# Patient Record
Sex: Male | Born: 1950
Health system: Southern US, Community
[De-identification: ages and names within clinical notes are randomized; demographics above are authoritative.]

## PROBLEM LIST (undated history)

## (undated) ENCOUNTER — Encounter: Attending: Internal Medicine | Primary: Internal Medicine

## (undated) ENCOUNTER — Encounter

## (undated) ENCOUNTER — Encounter: Payer: MEDICARE | Attending: Internal Medicine | Primary: Internal Medicine

## (undated) ENCOUNTER — Ambulatory Visit: Payer: MEDICARE

## (undated) ENCOUNTER — Encounter: Attending: Adult Health | Primary: Adult Health

## (undated) ENCOUNTER — Ambulatory Visit

## (undated) ENCOUNTER — Encounter: Attending: Family Medicine | Primary: Family Medicine

## (undated) ENCOUNTER — Ambulatory Visit: Payer: MEDICARE | Attending: Internal Medicine | Primary: Internal Medicine

## (undated) ENCOUNTER — Telehealth: Attending: Internal Medicine | Primary: Internal Medicine

## (undated) ENCOUNTER — Telehealth

## (undated) ENCOUNTER — Ambulatory Visit: Payer: MEDICARE | Attending: Orthopaedic Surgery | Primary: Orthopaedic Surgery

## (undated) ENCOUNTER — Ambulatory Visit: Attending: Family Medicine | Primary: Family Medicine

## (undated) ENCOUNTER — Ambulatory Visit: Payer: MEDICARE | Attending: Adult Health | Primary: Adult Health

## (undated) DIAGNOSIS — G4730 Sleep apnea, unspecified: Secondary | ICD-10-CM

## (undated) DIAGNOSIS — K219 Gastro-esophageal reflux disease without esophagitis: Secondary | ICD-10-CM

## (undated) DIAGNOSIS — I4891 Unspecified atrial fibrillation: Secondary | ICD-10-CM

## (undated) DIAGNOSIS — S62521P Displaced fracture of distal phalanx of right thumb, subsequent encounter for fracture with malunion: Secondary | ICD-10-CM

## (undated) DIAGNOSIS — M51369 Other intervertebral disc degeneration, lumbar region without mention of lumbar back pain or lower extremity pain: Secondary | ICD-10-CM

## (undated) DIAGNOSIS — M1990 Unspecified osteoarthritis, unspecified site: Secondary | ICD-10-CM

## (undated) DIAGNOSIS — S62639A Displaced fracture of distal phalanx of unspecified finger, initial encounter for closed fracture: Secondary | ICD-10-CM

## (undated) DIAGNOSIS — Z22322 Carrier or suspected carrier of Methicillin resistant Staphylococcus aureus: Secondary | ICD-10-CM

## (undated) DIAGNOSIS — M5136 Other intervertebral disc degeneration, lumbar region: Secondary | ICD-10-CM

## (undated) DIAGNOSIS — N529 Male erectile dysfunction, unspecified: Secondary | ICD-10-CM

## (undated) HISTORY — PX: LACERATION REPAIR: SHX5168

## (undated) HISTORY — DX: Displaced fracture of distal phalanx of right thumb, subsequent encounter for fracture with malunion: S62.521P

## (undated) HISTORY — PX: ABLATION: SHX5711

## (undated) HISTORY — DX: Carrier or suspected carrier of methicillin resistant Staphylococcus aureus: Z22.322

## (undated) HISTORY — DX: Gastro-esophageal reflux disease without esophagitis: K21.9

## (undated) HISTORY — PX: TONSILLECTOMY: SUR1361

## (undated) HISTORY — DX: Male erectile dysfunction, unspecified: N52.9

## (undated) HISTORY — DX: Other intervertebral disc degeneration, lumbar region without mention of lumbar back pain or lower extremity pain: M51.369

## (undated) HISTORY — DX: Displaced fracture of distal phalanx of unspecified finger, initial encounter for closed fracture: S62.639A

## (undated) HISTORY — DX: Unspecified osteoarthritis, unspecified site: M19.90

## (undated) HISTORY — DX: Other intervertebral disc degeneration, lumbar region: M51.36

---
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
## 1998-05-26 ENCOUNTER — Emergency Department (HOSPITAL_COMMUNITY): Admission: EM | Admit: 1998-05-26 | Discharge: 1998-05-26 | Payer: Self-pay

## 2004-08-28 ENCOUNTER — Ambulatory Visit: Payer: Self-pay | Admitting: Internal Medicine

## 2004-09-11 ENCOUNTER — Ambulatory Visit: Payer: Self-pay | Admitting: Internal Medicine

## 2004-10-16 ENCOUNTER — Ambulatory Visit: Payer: Self-pay | Admitting: Internal Medicine

## 2005-07-02 ENCOUNTER — Ambulatory Visit: Payer: Self-pay | Admitting: Internal Medicine

## 2011-08-14 ENCOUNTER — Ambulatory Visit (INDEPENDENT_AMBULATORY_CARE_PROVIDER_SITE_OTHER): Payer: PRIVATE HEALTH INSURANCE | Admitting: Internal Medicine

## 2011-08-14 VITALS — BP 102/63 | HR 51 | Temp 97.7°F | Resp 18 | Ht 70.0 in | Wt 175.0 lb

## 2011-08-14 DIAGNOSIS — Z23 Encounter for immunization: Secondary | ICD-10-CM

## 2011-08-14 MED ORDER — TETANUS-DIPHTH-ACELL PERTUSSIS 5-2.5-18.5 LF-MCG/0.5 IM SUSP
0.5000 mL | Freq: Once | INTRAMUSCULAR | Status: AC
  Administered 2011-08-14: 0.5 mL via INTRAMUSCULAR

## 2011-08-14 NOTE — Progress Notes (Signed)
  Subjective:    Patient ID: [name] Le, male    DOB: [birth year], 61 y.o.   MRN: [id number]  HPI Mr. Lizotte is here with his wife for Tdap, they are expecting their first grandchild next week.  He is in good health, works in [employer] is on no meds.  He has never had a reaction to immunizations before.    Review of Systems  All other systems reviewed and are negative.       Objective:   Physical Exam  Vitals reviewed. Constitutional: He appears well-developed and well-nourished.  HENT:  Head: Normocephalic.  Cardiovascular: Normal rate, regular rhythm and normal heart sounds.   Pulmonary/Chest: Effort normal and breath sounds normal.  Skin: Skin is warm and dry.  Psychiatric: He has a normal mood and affect. His behavior is normal.          Assessment & Plan:  Tdap updated today

## 2012-06-09 ENCOUNTER — Telehealth: Payer: Self-pay | Admitting: Internal Medicine

## 2012-06-09 ENCOUNTER — Ambulatory Visit (INDEPENDENT_AMBULATORY_CARE_PROVIDER_SITE_OTHER): Payer: PRIVATE HEALTH INSURANCE | Admitting: Internal Medicine

## 2012-06-09 ENCOUNTER — Other Ambulatory Visit: Payer: PRIVATE HEALTH INSURANCE

## 2012-06-09 ENCOUNTER — Encounter: Payer: Self-pay | Admitting: Internal Medicine

## 2012-06-09 VITALS — BP 120/80 | HR 76 | Temp 97.6°F | Resp 16 | Wt 176.0 lb

## 2012-06-09 DIAGNOSIS — IMO0002 Reserved for concepts with insufficient information to code with codable children: Secondary | ICD-10-CM

## 2012-06-09 DIAGNOSIS — L02419 Cutaneous abscess of limb, unspecified: Secondary | ICD-10-CM | POA: Insufficient documentation

## 2012-06-09 MED ORDER — SULFAMETHOXAZOLE-TRIMETHOPRIM 800-160 MG PO TABS: 1.0000 | ORAL_TABLET | Freq: Two times a day (BID) | ORAL | Status: DC

## 2012-06-09 NOTE — Assessment & Plan Note (Signed)
I and D done I await culture results Will empirically cover staph and MRSA with bactrim He RTC in 2-3 days for recheck and packing removal

## 2012-06-09 NOTE — Progress Notes (Signed)
  Subjective:    Patient ID: [name] Le, male    DOB: 10-Sep-1950, 62 y.o.   MRN: [id number]  HPI  New to me he complains of a 5 day history of worsening pain, redness, swelling in his left axilla.  Review of Systems  Constitutional: Negative for fever, chills, diaphoresis, activity change, appetite change, fatigue and unexpected weight change.  HENT: Negative.   Eyes: Negative.   Respiratory: Negative.   Cardiovascular: Negative.   Gastrointestinal: Negative.   Genitourinary: Negative.   Musculoskeletal: Negative.   Skin: Positive for wound. Negative for color change, pallor and rash.  Neurological: Negative.   Hematological: Negative for adenopathy. Does not bruise/bleed easily.  Psychiatric/Behavioral: Negative.        Objective:   Physical Exam  Vitals reviewed. Constitutional: He is oriented to person, place, and time. He appears well-developed and well-nourished.  Non-toxic appearance. He does not have a sickly appearance. He does not appear ill. No distress.  HENT:  Head: Normocephalic and atraumatic.  Mouth/Throat: Oropharynx is clear and moist. No oropharyngeal exudate.  Eyes: Conjunctivae normal are normal. Right eye exhibits no discharge. Left eye exhibits no discharge. No scleral icterus.  Neck: Normal range of motion. Neck supple. No JVD present. No tracheal deviation present. No thyromegaly present.  Cardiovascular: Normal rate, regular rhythm, normal heart sounds and intact distal pulses.  Exam reveals no gallop and no friction rub.   No murmur heard. Pulmonary/Chest: Effort normal and breath sounds normal. No stridor. No respiratory distress. He has no wheezes. He has no rales. He exhibits no tenderness.  Abdominal: Soft. Bowel sounds are normal. He exhibits no distension and no mass. There is no tenderness. There is no rebound and no guarding.  Musculoskeletal: Normal range of motion. He exhibits no edema and no tenderness.  Lymphadenopathy:    He has no  cervical adenopathy.  Neurological: He is oriented to person, place, and time.  Skin: Skin is warm and dry. No abrasion, no bruising, no burn, no laceration, no lesion, no petechiae, no purpura and no rash noted. Rash is not macular, not papular, not maculopapular, not nodular, not pustular, not vesicular and not urticarial. He is not diaphoretic. There is erythema. No pallor.       In the left axilla there is a 3 cm area of swelling, erythema, warmth with a central area that measures about 1 cm that shows a small pore with thick exudate surrounded by induration and fluctuance. There is no streaking.  The left axilla was cleaned with betadine. The abscess was anesthetized with 3 cc of 2% lido with epi that was infiltrated from the periphery at the 4 different sides.  Then a 4 mm punch incision was made in the central/fluctuant area and a small amount of thick purulent exudate was collected and sent to the lab. The cavity had a few loculations so it was irrigated with H2O2 on Q-tips. Then it was packed with iodoform. A dressing was applied. He tolerated the I and D well with no bleeding or complications.    Psychiatric: He has a normal mood and affect. His behavior is normal. Judgment and thought content normal.          Assessment & Plan:

## 2012-06-09 NOTE — Telephone Encounter (Signed)
Patient Information:  Caller Name: [place]  Phone: [phone]  Patient: [name] Le, [name] Le  Gender: Male  DOB: 12-29-1950  Age: 62 Years  PCP: Illene Regulus (Adults only)  Office Follow Up:  Does the office need to follow up with this patient?: No  Instructions For The Office: N/A   Symptoms  Reason For Call & Symptoms: Patient states he has "knots"  in Left axillary area.  He states there were x4.  He placed warm compresses on the area and all opened and drained except one of them.  Size-quarter. No head, +painful  Reviewed Health History In EMR: Yes  Reviewed Medications In EMR: Yes  Reviewed Allergies In EMR: Yes  Reviewed Surgeries / Procedures: No  Date of Onset of Symptoms: 05/28/2012  Treatments Tried: warm compresses 20 minutes, peroxide, soap and water, neosporin  Treatments Tried Worked: No  Guideline(s) Used:  Skin Lesion - Moles or Growths  Disposition Per Guideline:   See Today in Office  Reason For Disposition Reached:   Looks like a boil, infected sore, or deep ulcer  Advice Given:  Call Back If:  Fever or pain occurs  You become worse.  Appointment Scheduled:  06/09/2012 11:15:00 Appointment Scheduled Provider:  Sanda Linger (Adults only)

## 2012-06-09 NOTE — Patient Instructions (Signed)
Abscess An abscess is an infected area that contains a collection of pus and debris.It can occur in almost any part of the body. An abscess is also known as a furuncle or boil. CAUSES  An abscess occurs when tissue gets infected. This can occur from blockage of oil or sweat glands, infection of hair follicles, or a minor injury to the skin. As the body tries to fight the infection, pus collects in the area and creates pressure under the skin. This pressure causes pain. People with weakened immune systems have difficulty fighting infections and get certain abscesses more often.  SYMPTOMS Usually an abscess develops on the skin and becomes a painful mass that is red, warm, and tender. If the abscess forms under the skin, you may feel a moveable soft area under the skin. Some abscesses break open (rupture) on their own, but most will continue to get worse without care. The infection can spread deeper into the body and eventually into the bloodstream, causing you to feel ill.  DIAGNOSIS  Your caregiver will take your medical history and perform a physical exam. A sample of fluid may also be taken from the abscess to determine what is causing your infection. TREATMENT  Your caregiver may prescribe antibiotic medicines to fight the infection. However, taking antibiotics alone usually does not cure an abscess. Your caregiver may need to make a small cut (incision) in the abscess to drain the pus. In some cases, gauze is packed into the abscess to reduce pain and to continue draining the area. HOME CARE INSTRUCTIONS   Only take over-the-counter or prescription medicines for pain, discomfort, or fever as directed by your caregiver.  If you were prescribed antibiotics, take them as directed. Finish them even if you start to feel better.  If gauze is used, follow your caregiver's directions for changing the gauze.  To avoid spreading the infection:  Keep your draining abscess covered with a  bandage.  Wash your hands well.  Do not share personal care items, towels, or whirlpools with others.  Avoid skin contact with others.  Keep your skin and clothes clean around the abscess.  Keep all follow-up appointments as directed by your caregiver. SEEK MEDICAL CARE IF:   You have increased pain, swelling, redness, fluid drainage, or bleeding.  You have muscle aches, chills, or a general ill feeling.  You have a fever. MAKE SURE YOU:   Understand these instructions.  Will watch your condition.  Will get help right away if you are not doing well or get worse. Document Released: 02/03/2005 Document Revised: 10/26/2011 Document Reviewed: 07/09/2011 [facility] Patient Information 2013 [place], [place]. Incision and Drainage Incision and drainage is a procedure in which a sac-like structure (cystic structure) is opened and drained. The area to be drained usually contains material such as pus, fluid, or blood.  LET YOUR CAREGIVER KNOW ABOUT:   Allergies to medicine.  Medicines taken, including vitamins, herbs, eyedrops, over-the-counter medicines, and creams.  Use of steroids (by mouth or creams).  Previous problems with anesthetics or numbing medicines.  History of bleeding problems or blood clots.  Previous surgery.  Other health problems, including diabetes and kidney problems.  Possibility of pregnancy, if this applies. RISKS AND COMPLICATIONS  Pain.  Bleeding.  Scarring.  Infection. BEFORE THE PROCEDURE  You may need to have an ultrasound or other imaging tests to see how large or deep your cystic structure is. Blood tests may also be used to determine if you have an infection or  how severe the infection is. You may need to have a tetanus shot. PROCEDURE  The affected area is cleaned with a cleaning fluid. The cyst area will then be numbed with a medicine (local anesthetic). A small incision will be made in the cystic structure. A syringe or catheter may be  used to drain the contents of the cystic structure, or the contents may be squeezed out. The area will then be flushed with a cleansing solution. After cleansing the area, it is often gently packed with a gauze or another wound dressing. Once it is packed, it will be covered with gauze and tape or some other type of wound dressing. AFTER THE PROCEDURE   Often, you will be allowed to go home right after the procedure.  You may be given antibiotic medicine to prevent or heal an infection.  If the area was packed with gauze or some other wound dressing, you will likely need to come back in 1 to 2 days to get it removed.  The area should heal in about 14 days. Document Released: 10/20/2000 Document Revised: 10/26/2011 Document Reviewed: 06/21/2011 [facility] Patient Information 2013 [place], [place].

## 2012-06-11 LAB — WOUND CULTURE: Gram Stain: NONE SEEN

## 2012-06-12 ENCOUNTER — Encounter: Payer: Self-pay | Admitting: Internal Medicine

## 2012-06-12 ENCOUNTER — Ambulatory Visit (INDEPENDENT_AMBULATORY_CARE_PROVIDER_SITE_OTHER): Payer: PRIVATE HEALTH INSURANCE | Admitting: Internal Medicine

## 2012-06-12 VITALS — BP 104/62 | HR 98 | Temp 97.4°F | Resp 16 | Wt 173.5 lb

## 2012-06-12 DIAGNOSIS — IMO0002 Reserved for concepts with insufficient information to code with codable children: Secondary | ICD-10-CM

## 2012-06-12 DIAGNOSIS — L02419 Cutaneous abscess of limb, unspecified: Secondary | ICD-10-CM

## 2012-06-12 DIAGNOSIS — Z22322 Carrier or suspected carrier of Methicillin resistant Staphylococcus aureus: Secondary | ICD-10-CM

## 2012-06-12 HISTORY — DX: Carrier or suspected carrier of methicillin resistant Staphylococcus aureus: Z22.322

## 2012-06-12 NOTE — Assessment & Plan Note (Signed)
Continue bactrim-ds He will let me know if the other area of concern continues to enlarge or if it becomes painful or swollen

## 2012-06-12 NOTE — Patient Instructions (Signed)
Abscess An abscess is an infected area that contains a collection of pus and debris. It can occur in almost any part of the body. An abscess is also known as a furuncle or boil. CAUSES   An abscess occurs when tissue gets infected. This can occur from blockage of oil or sweat glands, infection of hair follicles, or a minor injury to the skin. As the body tries to fight the infection, pus collects in the area and creates pressure under the skin. This pressure causes pain. People with weakened immune systems have difficulty fighting infections and get certain abscesses more often.   SYMPTOMS Usually an abscess develops on the skin and becomes a painful mass that is red, warm, and tender. If the abscess forms under the skin, you may feel a moveable soft area under the skin. Some abscesses break open (rupture) on their own, but most will continue to get worse without care. The infection can spread deeper into the body and eventually into the bloodstream, causing you to feel ill.   DIAGNOSIS   Your caregiver will take your medical history and perform a physical exam. A sample of fluid may also be taken from the abscess to determine what is causing your infection. TREATMENT   Your caregiver may prescribe antibiotic medicines to fight the infection. However, taking antibiotics alone usually does not cure an abscess. Your caregiver may need to make a small cut (incision) in the abscess to drain the pus. In some cases, gauze is packed into the abscess to reduce pain and to continue draining the area. HOME CARE INSTRUCTIONS    Only take over-the-counter or prescription medicines for pain, discomfort, or fever as directed by your caregiver.   If you were prescribed antibiotics, take them as directed. Finish them even if you start to feel better.   If gauze is used, follow your caregiver's directions for changing the gauze.   To avoid spreading the infection:   Keep your draining abscess covered with a  bandage.   Wash your hands well.   Do not share personal care items, towels, or whirlpools with others.   Avoid skin contact with others.   Keep your skin and clothes clean around the abscess.   Keep all follow-up appointments as directed by your caregiver.  SEEK MEDICAL CARE IF:    You have increased pain, swelling, redness, fluid drainage, or bleeding.   You have muscle aches, chills, or a general ill feeling.   You have a fever.  MAKE SURE YOU:    Understand these instructions.   Will watch your condition.   Will get help right away if you are not doing well or get worse.  Document Released: 02/03/2005 Document Revised: 10/26/2011 Document Reviewed: 07/09/2011 [employer] Patient Information 2013 [employer].    

## 2012-06-12 NOTE — Progress Notes (Signed)
  Subjective:    Patient ID: [name] Le, male    DOB: 1951-03-23, 62 y.o.   MRN: [id number]  Wound Check He was originally treated 2 to 3 days ago. Previous treatment included I&D of abscess. The maximum temperature noted was less than 100.4 F. There has been no drainage from the wound. There is no redness present. There is no swelling present. The pain has improved. He has no difficulty moving the affected extremity or digit.      Review of Systems  Constitutional: Negative for fever, chills, diaphoresis, activity change, appetite change, fatigue and unexpected weight change.  HENT: Negative.   Eyes: Negative.   Respiratory: Negative for cough, chest tightness, shortness of breath, wheezing and stridor.   Cardiovascular: Negative.   Gastrointestinal: Negative.   Genitourinary: Negative.   Musculoskeletal: Negative.   Skin: Positive for wound. Negative for color change, pallor and rash.  Neurological: Negative.   Hematological: Negative for adenopathy. Does not bruise/bleed easily.  Psychiatric/Behavioral: Negative.        Objective:   Physical Exam  Skin:       Left axilla shows that the cavity is healing well, the packing is removed, a scant amount of purulent exudate was removed. No additional abscess or loculations were seen. The area was cleaned with a alcohol. There is one area just above the area of I and D that shows an erythematous papule with a pustule at the top. The area does not show a formed abscess as there is no induration/warmth/fluctuance.     Culture Moderate METHICILLIN RESISTANT STAPHYLOCOCCUS AUREUS GRAM STAIN Rare GRAM STAIN WBC present-predominately Mononuclear GRAM STAIN No Squamous Epithelial Cells Seen GRAM STAIN Rare Gram Positive Cocci In Pairs Organism ID, Bacteria METHICILLIN RESISTANT STAPHYLOCOCCUS AUREUS Comments      Assessment & Plan:

## 2012-06-12 NOTE — Assessment & Plan Note (Signed)
Continue bactrim-ds

## 2012-07-17 ENCOUNTER — Encounter: Payer: Self-pay | Admitting: Internal Medicine

## 2012-08-24 ENCOUNTER — Encounter: Payer: Self-pay | Admitting: Internal Medicine

## 2012-09-07 DIAGNOSIS — S62521P Displaced fracture of distal phalanx of right thumb, subsequent encounter for fracture with malunion: Secondary | ICD-10-CM

## 2012-09-07 HISTORY — DX: Displaced fracture of distal phalanx of right thumb, subsequent encounter for fracture with malunion: S62.521P

## 2012-09-07 HISTORY — PX: OTHER SURGICAL HISTORY: SHX169

## 2012-09-22 ENCOUNTER — Encounter: Payer: Self-pay | Admitting: Internal Medicine

## 2012-09-22 ENCOUNTER — Ambulatory Visit (INDEPENDENT_AMBULATORY_CARE_PROVIDER_SITE_OTHER): Payer: PRIVATE HEALTH INSURANCE | Admitting: Internal Medicine

## 2012-09-22 VITALS — BP 132/82 | HR 50 | Temp 97.4°F | Wt 174.0 lb

## 2012-09-22 DIAGNOSIS — L02419 Cutaneous abscess of limb, unspecified: Secondary | ICD-10-CM

## 2012-09-22 DIAGNOSIS — IMO0002 Reserved for concepts with insufficient information to code with codable children: Secondary | ICD-10-CM

## 2012-09-22 DIAGNOSIS — M5136 Other intervertebral disc degeneration, lumbar region: Secondary | ICD-10-CM | POA: Insufficient documentation

## 2012-09-22 DIAGNOSIS — K219 Gastro-esophageal reflux disease without esophagitis: Secondary | ICD-10-CM | POA: Insufficient documentation

## 2012-09-22 MED ORDER — DOXYCYCLINE HYCLATE 100 MG PO TABS: 100.0000 mg | ORAL_TABLET | Freq: Two times a day (BID) | ORAL | Status: DC

## 2012-09-22 NOTE — Progress Notes (Signed)
  Subjective:    Patient ID: [name] Le, male    DOB: April 23, 1951, 62 y.o.   MRN: [id number]  HPI  herer to fu with acute; mentions hx of left axillary abscess tx with septra,  MRSA culture positive; now with 2 small areas left axilla x 2 wks with pain, red, swelling, and slight d/c/drainage 2 days ago now stopped and one of the red areas is smaller.  No fever, chills or other skin changes such as right axilla. Pt denies chest pain, increased sob or doe, wheezing, orthopnea, PND, increased LE swelling, palpitations, dizziness or syncope.  Pt denies new neurological symptoms such as new headache, or facial or extremity weakness or numbness  Incidentally with crush injury and bone fx to most distal 5th finger right hand, has f/u later today with hand surgeon, likely to need surgury per pt due to bone fragmentation Past Medical History  Diagnosis Date  . MRSA (methicillin resistant staph aureus) culture positive 06/12/2012  . GERD (gastroesophageal reflux disease)   . Lumbar degenerative disc disease    Past Surgical History  Procedure Laterality Date  . Tonsillectomy      reports that he has never smoked. He has never used smokeless tobacco. He reports that  drinks alcohol. He reports that he does not use illicit drugs. family history includes Lung cancer in his mother. No Known Allergies No current outpatient prescriptions on file prior to visit.   No current facility-administered medications on file prior to visit.     Review of Systems  All otherwise neg per pt    Objective:   Physical Exam BP 132/82  Pulse 50  Temp(Src) 97.4 F (36.3 C) (Oral)  Wt 174 lb (78.926 kg)  BMI 24.97 kg/m2  SpO2 98% VS noted, not ill appaering Constitutional: Pt appears well-developed and well-nourished.  HENT: Head: NCAT.  Right Ear: External ear normal.  Left Ear: External ear normal.  Eyes: Conjunctivae and EOM are normal. Pupils are equal, round, and reactive to light.  Neck: Normal range of  motion. Neck supple.  Cardiovascular: Normal rate and regular rhythm.   Pulmonary/Chest: Effort normal and breath sounds normal.  Neurological: Pt is alert. Not confused  Skin: left axilla with 2 approx 1 cm sized tender/red/swelling areas without fluctuance or drainage Psychiatric: Pt behavior is normal. Thought content normal.     Assessment & Plan:

## 2012-09-22 NOTE — Patient Instructions (Signed)
Please take all new medication as prescribed Please continue all other medications as before Please keep your appointments with your specialists as you have planned - your hand surgeon

## 2012-09-23 NOTE — Assessment & Plan Note (Signed)
With hx of culture proven mrsa, for doxy course,  to f/u any worsening symptoms or concerns

## 2012-11-13 ENCOUNTER — Other Ambulatory Visit (INDEPENDENT_AMBULATORY_CARE_PROVIDER_SITE_OTHER): Payer: PRIVATE HEALTH INSURANCE

## 2012-11-13 ENCOUNTER — Encounter: Payer: Self-pay | Admitting: Internal Medicine

## 2012-11-13 ENCOUNTER — Ambulatory Visit (INDEPENDENT_AMBULATORY_CARE_PROVIDER_SITE_OTHER): Payer: PRIVATE HEALTH INSURANCE | Admitting: Internal Medicine

## 2012-11-13 VITALS — BP 118/80 | HR 56 | Resp 8 | Ht 69.0 in | Wt 179.8 lb

## 2012-11-13 DIAGNOSIS — Z125 Encounter for screening for malignant neoplasm of prostate: Secondary | ICD-10-CM

## 2012-11-13 DIAGNOSIS — Z0000 Encounter for general adult medical examination without abnormal findings: Secondary | ICD-10-CM

## 2012-11-13 DIAGNOSIS — K219 Gastro-esophageal reflux disease without esophagitis: Secondary | ICD-10-CM

## 2012-11-13 DIAGNOSIS — Z1211 Encounter for screening for malignant neoplasm of colon: Secondary | ICD-10-CM

## 2012-11-13 LAB — COMPREHENSIVE METABOLIC PANEL
Albumin: 4.3 g/dL (ref 3.5–5.2)
BUN: 10 mg/dL (ref 6–23)
CO2: 32 mEq/L (ref 19–32)
Calcium: 9.6 mg/dL (ref 8.4–10.5)
Chloride: 106 mEq/L (ref 96–112)
GFR: 92.71 mL/min (ref >60.00)
Glucose, Bld: 100 mg/dL — ABNORMAL HIGH (ref 70–99)
Potassium: 4.3 mEq/L (ref 3.5–5.1)
Sodium: 140 mEq/L (ref 135–145)
Total Protein: 6.7 g/dL (ref 6.0–8.3)

## 2012-11-13 LAB — LIPID PANEL
Cholesterol: 172 mg/dL (ref 0–200)
LDL Cholesterol: 101 mg/dL — ABNORMAL HIGH (ref 0–99)
Triglycerides: 111 mg/dL (ref 0.0–149.0)

## 2012-11-13 NOTE — Assessment & Plan Note (Signed)
Long standing problem controlled with H2 blocker - Ranitidine. He has no active complaints in this regard  Plan Continue present regimen

## 2012-11-13 NOTE — Progress Notes (Signed)
Subjective:    Patient ID: [name] Le, male    DOB: [birth year], 62 y.o.   MRN: [id number]  HPI Mr. Bartling presents for a general medical exam. In the interval he had a tuft fracture of the right 5th digit and required a pin. He has otherwise been well.   Past Medical History  Diagnosis Date  . MRSA (methicillin resistant staph aureus) culture positive 06/12/2012    left axilla  . GERD (gastroesophageal reflux disease)   . Lumbar degenerative disc disease     no problems that limit activity  . Closed fracture of tuft of distal phalanx of finger 90's    left  . Closed fracture of tuft of distal phalanx of right thumb with malunion May '14    ORIF with pin ([facility])  . ED (erectile dysfunction)    Past Surgical History  Procedure Laterality Date  . Tonsillectomy    . Tuft fracture Left may '14    ORIF with PIN ( [facility])  . Laceration repair Left     chain laceration left forearm - 13 stitches.   Family History  Problem Relation Age of Onset  . Lung cancer Mother   . Cancer Mother     lung with mets   History   Social History  . Marital Status: Married    Spouse Name: N/A    Number of Children: 2  . Years of Education: 12   Occupational History  . hospital gas systems    Social History Main Topics  . Smoking status: Never Smoker   . Smokeless tobacco: Never Used  . Alcohol Use: 0.0 oz/week     Comment: rare -  . Drug Use: No  . Sexually Active: Yes -- Male partner(s)   Other Topics Concern  . 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 Married '78, 1 dtr - '81, 1 son - '79, 1 grandchild. Work - Psychiatrist. Marriage in good health.    Current Outpatient Prescriptions on File Prior to Visit  Medication Sig Dispense Refill  . ranitidine (ZANTAC) 75 MG tablet Take 75 mg by mouth 2 (two) times daily.       No current facility-administered medications  on file prior to visit.      Review of Systems Constitutional:  Negative for fever, chills, activity change and unexpected weight change.  HEENT:  Negative for hearing loss, ear pain, congestion, neck stiffness and postnasal drip. Negative for sore throat or swallowing problems. Negative for dental complaints.   Eyes: Negative for vision loss or change in visual acuity.  Respiratory: Negative for chest tightness and wheezing. Negative for DOE.   Cardiovascular: Negative for chest pain or palpitations. No decreased exercise tolerance Gastrointestinal: No change in bowel habit. No bloating or gas. No reflux or indigestion Genitourinary: Negative for urgency, frequency, flank pain and difficulty urinating.  Musculoskeletal: Negative for myalgias, back pain, arthralgias and gait problem. Recovering from tuft repair.  Neurological: Negative for dizziness, tremors, weakness and headaches.  Hematological: Negative for adenopathy.  Psychiatric/Behavioral: Negative for behavioral problems and dysphoric mood.        Objective:   Physical Exam Filed Vitals:   11/13/12 0853  BP: 118/80  Pulse: 56  Resp: 8   Wt Readings from Last 3 Encounters:  11/13/12 179 lb 12.8 oz (81.557 kg)  09/22/12 174 lb (78.926 kg)  06/12/12 173 lb 8 oz (78.699 kg)  Gen'l: Well nourished well developed     male in no acute distress  HEENT: Head: Normocephalic and atraumatic. Right Ear: External ear normal. EAC/TM nl. Left Ear: External ear normal.  EAC/TM nl. Nose: Nose normal. Mouth/Throat: Oropharynx is clear and moist. Dentition - native, in good repair. No buccal or palatal lesions. Posterior pharynx clear. Eyes: Conjunctivae and sclera clear. EOM intact. Pupils are equal, round, and reactive to light. Right eye exhibits no discharge. Left eye exhibits no discharge. Neck: Normal range of motion. Neck supple. No JVD present. No tracheal deviation present. No thyromegaly present.  Cardiovascular: Normal rate,  regular rhythm, no gallop, no friction rub, no murmur heard.      Quiet precordium. 2+ radial and DP pulses . No carotid bruits Pulmonary/Chest: Effort normal. No respiratory distress or increased WOB, no wheezes, no rales. No chest wall deformity or CVAT. Abdomen: Soft. Bowel sounds are normal in all quadrants. He exhibits no distension, no tenderness, no rebound or guarding, No heptosplenomegaly  Genitourinary:   Musculoskeletal: Normal range of motion. He exhibits no edema and no tenderness.       Small and large joints without redness, synovial thickening or deformity. Full range of motion preserved about all small, median and large joints.  Lymphadenopathy:    He has no cervical or supraclavicular adenopathy.  Neurological: He is alert and oriented to person, place, and time. CN II-XII intact. DTRs 2+ and symmetrical biceps, radial and patellar tendons. Cerebellar function normal with no tremor, rigidity, normal gait and station.  Skin: Skin is warm and dry. No rash noted. No erythema.  Psychiatric: He has a normal mood and affect. His behavior is normal. Thought content normal.    Recent Results (from the past 2160 hour(s))  LIPID PANEL     Status: Abnormal   Collection Time    11/13/12 10:21 AM      Result Value Range   Cholesterol 172  0 - 200 mg/dL   Comment: ATP III Classification       Desirable:  < 200 mg/dL               Borderline High:  200 - 239 mg/dL          High:  > = 161 mg/dL   Triglycerides 096.0  0.0 - 149.0 mg/dL   Comment: Normal:  <454 mg/dLBorderline High:  150 - 199 mg/dL   HDL 09.81  >19.14 mg/dL   VLDL 78.2  0.0 - 95.6 mg/dL   LDL Cholesterol 213 (*) 0 - 99 mg/dL   Total CHOL/HDL Ratio 4     Comment:                Men          Women1/2 Average Risk     3.4          3.3Average Risk          5.0          4.42X Average Risk          9.6          7.13X Average Risk          15.0          11.0                      PSA     Status: None   Collection Time     11/13/12 10:21 AM  Result Value Range   PSA 0.72  0.10 - 4.00 ng/mL  COMPREHENSIVE METABOLIC PANEL     Status: Abnormal   Collection Time    11/13/12 10:21 AM      Result Value Range   Sodium 140  135 - 145 mEq/L   Potassium 4.3  3.5 - 5.1 mEq/L   Chloride 106  96 - 112 mEq/L   CO2 32  19 - 32 mEq/L   Glucose, Bld 100 (*) 70 - 99 mg/dL   BUN 10  6 - 23 mg/dL   Creatinine, Ser 0.9  0.4 - 1.5 mg/dL   Total Bilirubin 0.9  0.3 - 1.2 mg/dL   Alkaline Phosphatase 42  39 - 117 U/L   AST 15  0 - 37 U/L   ALT 21  0 - 53 U/L   Total Protein 6.7  6.0 - 8.3 g/dL   Albumin 4.3  3.5 - 5.2 g/dL   Calcium 9.6  8.4 - 16.1 mg/dL   GFR 09.60  >45.40 mL/min        Assessment & Plan:

## 2012-11-13 NOTE — Assessment & Plan Note (Signed)
Interval history significant for tuft fracture right 5th digit with ORIF. He is otherwise healthy and doing well. Physical exam is normal. Labs reviewed - in normal range. He agrees to be scheduled for colonoscopy. Discussed pros and cons of prostate cancer screening (USPHCTF recommendations reviewed and ACU April '13 recommendations) and he requests evaluation at this time: PSA 0.71 - normal. Next exam recommended in 2-3 years. Immunization - current with Tdap.  Mole - right face - appears benign but he will consider shave biopsy excision and call back if interested.  In summary  -  A nice man who appears to be in good health and medically stable. He is advised to return in 18-24 months.

## 2012-11-13 NOTE — Patient Instructions (Addendum)
Thanks for coming back.  You appear to be medically stable and in good health.  Lab work today - results on MyChart within 72 hrs.  You should have colonoscopy and you will be contacted about scheduling.  You should have an eye exam! The important issue is glaucoma.  Your next general physical exam should be in 24 months.  Please come back at your descretion for mole removal right cheek which does look benign.

## 2013-03-15 ENCOUNTER — Other Ambulatory Visit: Payer: Self-pay

## 2013-11-02 ENCOUNTER — Ambulatory Visit: Payer: PRIVATE HEALTH INSURANCE | Admitting: Internal Medicine

## 2016-11-08 ENCOUNTER — Ambulatory Visit (INDEPENDENT_AMBULATORY_CARE_PROVIDER_SITE_OTHER): Payer: BLUE CROSS/BLUE SHIELD | Admitting: Family

## 2016-11-08 ENCOUNTER — Encounter: Payer: Self-pay | Admitting: Family

## 2016-11-08 ENCOUNTER — Other Ambulatory Visit (INDEPENDENT_AMBULATORY_CARE_PROVIDER_SITE_OTHER): Payer: BLUE CROSS/BLUE SHIELD

## 2016-11-08 VITALS — BP 122/88 | HR 66 | Temp 98.1°F | Resp 16 | Ht 69.0 in | Wt 169.8 lb

## 2016-11-08 DIAGNOSIS — Z125 Encounter for screening for malignant neoplasm of prostate: Secondary | ICD-10-CM | POA: Diagnosis not present

## 2016-11-08 DIAGNOSIS — Z7289 Other problems related to lifestyle: Secondary | ICD-10-CM

## 2016-11-08 DIAGNOSIS — M7542 Impingement syndrome of left shoulder: Secondary | ICD-10-CM | POA: Diagnosis not present

## 2016-11-08 DIAGNOSIS — Z7689 Persons encountering health services in other specified circumstances: Secondary | ICD-10-CM | POA: Insufficient documentation

## 2016-11-08 DIAGNOSIS — Z0001 Encounter for general adult medical examination with abnormal findings: Secondary | ICD-10-CM | POA: Insufficient documentation

## 2016-11-08 LAB — CBC
HEMATOCRIT: 48.3 % (ref 39.0–52.0)
Hemoglobin: 16.8 g/dL (ref 13.0–17.0)
MCHC: 34.8 g/dL (ref 30.0–36.0)
MCV: 93.2 fl (ref 78.0–100.0)
Platelets: 282 10*3/uL (ref 150.0–400.0)
RBC: 5.18 Mil/uL (ref 4.22–5.81)
RDW: 13.8 % (ref 11.5–15.5)
WBC: 6.5 10*3/uL (ref 4.0–10.5)

## 2016-11-08 LAB — PSA: PSA: 1.77 ng/mL (ref 0.10–4.00)

## 2016-11-08 LAB — COMPREHENSIVE METABOLIC PANEL
ALBUMIN: 4.4 g/dL (ref 3.5–5.2)
ALK PHOS: 41 U/L (ref 39–117)
ALT: 36 U/L (ref 0–53)
AST: 19 U/L (ref 0–37)
BUN: 20 mg/dL (ref 6–23)
CHLORIDE: 104 meq/L (ref 96–112)
CO2: 28 mEq/L (ref 19–32)
CREATININE: 1.04 mg/dL (ref 0.40–1.50)
Calcium: 10.1 mg/dL (ref 8.4–10.5)
GFR: 76.45 mL/min (ref >60.00)
GLUCOSE: 113 mg/dL — AB (ref 70–99)
POTASSIUM: 4.8 meq/L (ref 3.5–5.1)
SODIUM: 140 meq/L (ref 135–145)
TOTAL PROTEIN: 6.9 g/dL (ref 6.0–8.3)
Total Bilirubin: 0.8 mg/dL (ref 0.2–1.2)

## 2016-11-08 LAB — LIPID PANEL
CHOLESTEROL: 183 mg/dL (ref 0–200)
HDL: 52.4 mg/dL (ref >39.00)
LDL CALC: 114 mg/dL — AB (ref 0–99)
NONHDL: 130.45
Total CHOL/HDL Ratio: 3
Triglycerides: 82 mg/dL (ref 0.0–149.0)
VLDL: 16.4 mg/dL (ref 0.0–40.0)

## 2016-11-08 MED ORDER — IBUPROFEN-FAMOTIDINE 800-26.6 MG PO TABS: 1.0000 | ORAL_TABLET | Freq: Three times a day (TID) | ORAL | 1 refills | Status: DC | PRN

## 2016-11-08 MED ORDER — DICLOFENAC SODIUM 2 % TD SOLN: 1.0000 "application " | Freq: Two times a day (BID) | TRANSDERMAL | 1 refills | Status: DC | PRN

## 2016-11-08 NOTE — Assessment & Plan Note (Addendum)
1) Anticipatory Guidance: Discussed importance of wearing a seatbelt while driving and not texting while driving; changing batteries in smoke detector at least once annually; wearing suntan lotion when outside; eating a balanced and moderate diet; getting physical activity at least 30 minutes per day.  2) Immunizations / Screenings / Labs:  All immunizations are up-to-date per recommendations. Obtain hepatitis C antibody for hepatitis C screening. Due for colon cancer screening which he will check on her insurance. Obtain PSA for prostate cancer screening. All other screenings are up-to-date per recommendations. Obtain CBC, CMET, and lipid profile.    Overall well exam with risk factors for cardiovascular disease being minimal at this time. He is very active for his job, however would recommend increasing physical activity outside of work. He is of good weight and eats a nutritionally balanced, moderate, and varied intake. Continue healthy lifestyle behaviors and choices. Follow-up prevention exam in 1 year. Follow-up office visit pending blood work.

## 2016-11-08 NOTE — Assessment & Plan Note (Signed)
Chronic shoulder pain with symptoms consistent with shoulder impingement likely associated with repetitive lifting. Treat conservatively with ice and home exercise therapy. Start Pennsaid and Duexis. If symptoms worsen or do not improve consider cortisone injection and physical therapy.

## 2016-11-08 NOTE — Progress Notes (Signed)
Subjective:    Patient ID: [name] Le, male    DOB: 12/01/1950, 66 y.o.   MRN: [id number]  Chief Complaint  Patient presents with  . Establish Care    CPE, fasting    HPI:  [name] Le is a 66 y.o. male who presents today for an annual wellness visit.   1) Health Maintenance -   Diet - Averages about 3 meals per day consisting of a regular diet; Caffeine intake of about 3-4 cups per day on average  Exercise - No structured exercise; walks around hospitals   2) Preventative Exams / Immunizations:  Dental -- Up to date  Vision -- Up to date   Health Maintenance  Topic Date Due  . Hepatitis C Screening  November 10, 1950  . HIV Screening  12/24/1967  . COLONOSCOPY  12/24/2002  . INFLUENZA VACCINE  12/08/2016  . TETANUS/TDAP  08/13/2021    Immunization History  Administered Date(s) Administered  . Tdap 08/14/2011    3.) Shoulder - This is a new problem. Associated symptom of pain located in his left shoulder has been about 4-5 years. He is right hand dominant. No trauma or injury but does do a lot of heavy lifting. Pain is described as a mild/dull ache with occasional sharp pain that is aggravated with certain movements. Modifying factors include ibuprofen which does help a little. No neck pain or radiculopathy. Severity is enough to effect his ability to lie on his left side. Overhead activities are challenging and seems to have decreased strength.   No Known Allergies   Outpatient Medications Prior to Visit  Medication Sig Dispense Refill  . ranitidine (ZANTAC) 75 MG tablet Take 75 mg by mouth 2 (two) times daily.     No facility-administered medications prior to visit.      Past Medical History:  Diagnosis Date  . Closed fracture of tuft of distal phalanx of finger 90's   left  . Closed fracture of tuft of distal phalanx of right thumb with malunion May '14   ORIF with pin ([facility])  . ED (erectile dysfunction)   . GERD (gastroesophageal reflux disease)     . Lumbar degenerative disc disease    no problems that limit activity  . MRSA (methicillin resistant staph aureus) culture positive 06/12/2012   left axilla     Past Surgical History:  Procedure Laterality Date  . LACERATION REPAIR Left    chain laceration left forearm - 13 stitches.  . TONSILLECTOMY    . tuft fracture Left may '14   ORIF with PIN ( [facility])     Family History  Problem Relation Age of Onset  . Lung cancer Mother   . Cancer Mother        lung with mets     Social History   Social History  . Marital status: Married    Spouse name: N/A  . Number of children: 2  . Years of education: 12   Occupational History  . hospital gas systems    Social History Main Topics  . Smoking status: Never Smoker  . Smokeless tobacco: Never Used  . Alcohol use 0.0 oz/week     Comment: Sporadic - 2-3 / month  . Drug use: No  . Sexual activity: Yes    Partners: Female   Other Topics Concern  . 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 Married '78, 1 dtr - '81, 1 son - '  79, 1 grandchild. 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 Marriage in good health.      Review of Systems  Constitutional: Denies fever, chills, fatigue, or significant weight gain/loss. HENT: Head: Denies headache or neck pain Ears: Denies changes in hearing, ringing in ears, earache, drainage Nose: Denies discharge, stuffiness, itching, nosebleed, sinus pain Throat: Denies sore throat, hoarseness, dry mouth, sores, thrush Eyes: Denies loss/changes in vision, pain, redness, blurry/double vision, flashing lights Cardiovascular: Denies chest pain/discomfort, tightness, palpitations, shortness of breath with activity, difficulty lying down, swelling, sudden awakening with shortness of breath Respiratory: Denies shortness of breath, cough, sputum production, wheezing Gastrointestinal: Denies dysphasia,  heartburn, change in appetite, nausea, change in bowel habits, rectal bleeding, constipation, diarrhea, yellow skin or eyes Genitourinary: Denies frequency, urgency, burning/pain, blood in urine, incontinence, change in urinary strength. Musculoskeletal: Denies muscle/joint pain (other than below) , stiffness, back pain, redness or swelling of joints, trauma Positive for left shoulder pain.  Skin: Denies rashes, lumps, itching, dryness, color changes, or hair/nail changes Neurological: Denies dizziness, fainting, seizures, weakness, numbness, tingling, tremor Psychiatric - Denies nervousness, stress, depression or memory loss Endocrine: Denies heat or cold intolerance, sweating, frequent urination, excessive thirst, changes in appetite Hematologic: Denies ease of bruising or bleeding     Objective:     BP 122/88 (BP Location: Left Arm, Patient Position: Sitting, Cuff Size: Normal)   Pulse 66   Temp 98.1 F (36.7 C) (Oral)   Resp 16   Ht 5\' 9"  (1.753 m)   Wt 169 lb 12.8 oz (77 kg)   SpO2 97%   BMI 25.08 kg/m  Nursing note and vital signs reviewed.  Physical Exam  Constitutional: He is oriented to person, place, and time. He appears well-developed and well-nourished. No distress.  HENT:  Head: Normocephalic.  Right Ear: Hearing, tympanic membrane, external ear and ear canal normal.  Left Ear: Hearing, tympanic membrane, external ear and ear canal normal.  Nose: Nose normal.  Mouth/Throat: Uvula is midline, oropharynx is clear and moist and mucous membranes are normal.  Eyes: Conjunctivae and EOM are normal. Pupils are equal, round, and reactive to light.  Neck: Neck supple. No JVD present. No tracheal deviation present. No thyromegaly present.  Cardiovascular: Normal rate, regular rhythm, normal heart sounds and intact distal pulses.   Pulmonary/Chest: Effort normal and breath sounds normal.  Abdominal: Soft. Bowel sounds are normal. He exhibits no distension and no mass. There is  no tenderness. There is no rebound and no guarding.  Musculoskeletal: Normal range of motion. He exhibits no edema or tenderness.  Left shoulder - no obvious deformity, discoloration, or edema. No palpable tenderness able to be elicited. Range of motion within normal limits with discomfort noted in greater than 120 of flexion and abduction. Muscle strength is normal. Distal pulses and sensation are intact and appropriate. Positive Leanord AsalHawkins Kennedy; positive Neer's impingement; negative empty can.  Lymphadenopathy:    He has no cervical adenopathy.  Neurological: He is alert and oriented to person, place, and time. He has normal reflexes. No cranial nerve deficit. He exhibits normal muscle tone. Coordination normal.  Skin: Skin is warm and dry.  Psychiatric: He has a normal mood and affect. His behavior is normal. Judgment and thought content normal.       Assessment & Plan:   Problem List Items Addressed This Visit      Other   Encounter for general adult medical examination with abnormal findings - Primary    1) Anticipatory  Guidance: Discussed importance of wearing a seatbelt while driving and not texting while driving; changing batteries in smoke detector at least once annually; wearing suntan lotion when outside; eating a balanced and moderate diet; getting physical activity at least 30 minutes per day.  2) Immunizations / Screenings / Labs:  All immunizations are up-to-date per recommendations. Obtain hepatitis C antibody for hepatitis C screening. Due for colon cancer screening which he will check on her insurance. Obtain PSA for prostate cancer screening. All other screenings are up-to-date per recommendations. Obtain CBC, CMET, and lipid profile.    Overall well exam with risk factors for cardiovascular disease being minimal at this time. He is very active for his job, however would recommend increasing physical activity outside of work. He is of good weight and eats a nutritionally  balanced, moderate, and varied intake. Continue healthy lifestyle behaviors and choices. Follow-up prevention exam in 1 year. Follow-up office visit pending blood work.      Relevant Orders   CBC (Completed)   Comprehensive metabolic panel (Completed)   Lipid panel (Completed)   PSA (Completed)   Impingement syndrome of left shoulder    Chronic shoulder pain with symptoms consistent with shoulder impingement likely associated with repetitive lifting. Treat conservatively with ice and home exercise therapy. Start Pennsaid and Duexis. If symptoms worsen or do not improve consider cortisone injection and physical therapy.        Other Visit Diagnoses    Other problems related to lifestyle       Relevant Orders   Hepatitis C antibody       I am having Mr. Breon maintain his ranitidine, ibuprofen, Ibuprofen-Famotidine, and Diclofenac Sodium.   Meds ordered this encounter  Medications  . ibuprofen (ADVIL,MOTRIN) 200 MG tablet    Sig: Take 200 mg by mouth every 6 (six) hours as needed.  Marland Kitchen DISCONTD: Diclofenac Sodium (PENNSAID) 2 % SOLN    Sig: Place 1 application onto the skin 2 (two) times daily as needed.    Dispense:  112 g    Refill:  1    Order Specific Question:   Supervising Provider    Answer:   Hillard Danker A [[id number]]  . DISCONTD: Ibuprofen-Famotidine 800-26.6 MG TABS    Sig: Take 1 tablet by mouth 3 (three) times daily as needed.    Dispense:  90 tablet    Refill:  1    Order Specific Question:   Supervising Provider    Answer:   Hillard Danker A [[id number]]  . Ibuprofen-Famotidine 800-26.6 MG TABS    Sig: Take 1 tablet by mouth 3 (three) times daily as needed.    Dispense:  90 tablet    Refill:  1    Order Specific Question:   Supervising Provider    Answer:   Hillard Danker A [[id number]]  . Diclofenac Sodium (PENNSAID) 2 % SOLN    Sig: Place 1 application onto the skin 2 (two) times daily as needed.    Dispense:  112 g    Refill:  1    Order Specific  Question:   Supervising Provider    Answer:   Hillard Danker A [[id number]]     Follow-up: Return in about 1 month (around 12/09/2016), or if symptoms worsen or fail to improve.   Jeanine Luz, FNP

## 2016-11-08 NOTE — Patient Instructions (Addendum)
Thank you for choosing [facility].  SUMMARY AND INSTRUCTIONS:  Ice 20 minutes every 2 hours and as needed or following activity.  Stretches and exercises daily.  Continue healthy lifestyle behaviors and choices.   Please check on colon cancer screening through Colonoscopy or Cologuard.    Medication:  Duexis - 1 tablet 3x daily for the next 7 days and then as needed.  Pennsaid - 1/2 packet (1 pump) to the shoulder 2x daily.  Your prescription(s) have been submitted to your pharmacy or been printed and provided for you. Please take as directed and contact our office if you believe you are having problem(s) with the medication(s) or have any questions.  Labs:  Please stop by the lab on the lower level of the building for your blood work. Your results will be released to MyChart (or called to you) after review, usually within 72 hours after test completion. If any changes need to be made, you will be notified at that same time.  1.) The lab is open from 7:30am to 5:30 pm Monday-Friday 2.) No appointment is necessary 3.) Fasting (if needed) is 6-8 hours after food and drink; black coffee and water are okay   Follow up:  If your shoulder pain does not improve we will consider a cortisone injection, physical therapy and imaging.   If your symptoms worsen or fail to improve, please contact our office for further instruction, or in case of emergency go directly to the emergency room at the closest medical facility.     Health Maintenance, Male A healthy lifestyle and preventive care is important for your health and wellness. Ask your health care provider about what schedule of regular examinations is right for you. What should I know about weight and diet? Eat a Healthy Diet  Eat plenty of vegetables, fruits, whole grains, low-fat dairy products, and lean protein.  Do not eat a lot of foods high in solid fats, added sugars, or salt.  Maintain a Healthy Weight Regular  exercise can help you achieve or maintain a healthy weight. You should:  Do at least 150 minutes of exercise each week. The exercise should increase your heart rate and make you sweat (moderate-intensity exercise).  Do strength-training exercises at least twice a week.  Watch Your Levels of Cholesterol and Blood Lipids  Have your blood tested for lipids and cholesterol every 5 years starting at 64 years of age. If you are at high risk for heart disease, you should start having your blood tested when you are 64 years old. You may need to have your cholesterol levels checked more often if: ? Your lipid or cholesterol levels are high. ? You are older than 64 years of age. ? You are at high risk for heart disease.  What should I know about cancer screening? Many types of cancers can be detected early and may often be prevented. Lung Cancer  You should be screened every year for lung cancer if: ? You are a current smoker who has smoked for at least 30 years. ? You are a former smoker who has quit within the past 15 years.  Talk to your health care provider about your screening options, when you should start screening, and how often you should be screened.  Colorectal Cancer  Routine colorectal cancer screening usually begins at 64 years of age and should be repeated every 5-10 years until you are 64 years old. You may need to be screened more often if early forms of  precancerous polyps or small growths are found. Your health care provider may recommend screening at an earlier age if you have risk factors for colon cancer.  Your health care provider may recommend using home test kits to check for hidden blood in the stool.  A small camera at the end of a tube can be used to examine your colon (sigmoidoscopy or colonoscopy). This checks for the earliest forms of colorectal cancer.  Prostate and Testicular Cancer  Depending on your age and overall health, your health care provider may do  certain tests to screen for prostate and testicular cancer.  Talk to your health care provider about any symptoms or concerns you have about testicular or prostate cancer.  Skin Cancer  Check your skin from head to toe regularly.  Tell your health care provider about any new moles or changes in moles, especially if: ? There is a change in a mole's size, shape, or color. ? You have a mole that is larger than a pencil eraser.  Always use sunscreen. Apply sunscreen liberally and repeat throughout the day.  Protect yourself by wearing long sleeves, pants, a wide-brimmed hat, and sunglasses when outside.  What should I know about heart disease, diabetes, and high blood pressure?  If you are 99-72 years of age, have your blood pressure checked every 3-5 years. If you are 27 years of age or older, have your blood pressure checked every year. You should have your blood pressure measured twice-once when you are at a hospital or clinic, and once when you are not at a hospital or clinic. Record the average of the two measurements. To check your blood pressure when you are not at a hospital or clinic, you can use: ? An automated blood pressure machine at a pharmacy. ? A home blood pressure monitor.  Talk to your health care provider about your target blood pressure.  If you are between 73-9 years old, ask your health care provider if you should take aspirin to prevent heart disease.  Have regular diabetes screenings by checking your fasting blood sugar level. ? If you are at a normal weight and have a low risk for diabetes, have this test once every three years after the age of 6. ? If you are overweight and have a high risk for diabetes, consider being tested at a younger age or more often.  A one-time screening for abdominal aortic aneurysm (AAA) by ultrasound is recommended for men aged 65-75 years who are current or former smokers. What should I know about preventing infection? Hepatitis  B If you have a higher risk for hepatitis B, you should be screened for this virus. Talk with your health care provider to find out if you are at risk for hepatitis B infection. Hepatitis C Blood testing is recommended for:  Everyone born from [birth year] through 1963.  Anyone with known risk factors for hepatitis C.  Sexually Transmitted Diseases (STDs)  You should be screened each year for STDs including gonorrhea and chlamydia if: ? You are sexually active and are younger than 64 years of age. ? You are older than 64 years of age and your health care provider tells you that you are at risk for this type of infection. ? Your sexual activity has changed since you were last screened and you are at an increased risk for chlamydia or gonorrhea. Ask your health care provider if you are at risk.  Talk with your health care provider about whether you are at  high risk of being infected with HIV. Your health care provider may recommend a prescription medicine to help prevent HIV infection.  What else can I do?  Schedule regular health, dental, and eye exams.  Stay current with your vaccines (immunizations).  Do not use any tobacco products, such as cigarettes, chewing tobacco, and e-cigarettes. If you need help quitting, ask your health care provider.  Limit alcohol intake to no more than 2 drinks per day. One drink equals 12 ounces of beer, 5 ounces of wine, or 1 ounces of hard liquor.  Do not use street drugs.  Do not share needles.  Ask your health care provider for help if you need support or information about quitting drugs.  Tell your health care provider if you often feel depressed.  Tell your health care provider if you have ever been abused or do not feel safe at home. This information is not intended to replace advice given to you by your health care provider. Make sure you discuss any questions you have with your health care provider. Document Released: 10/23/2007 Document  Revised: 12/24/2015 Document Reviewed: 01/28/2015 Elsevier Interactive Patient Education  2018 [employer].   Secondary Shoulder Impingement Syndrome Rehab Ask your health care provider which exercises are safe for you. Do exercises exactly as told by your health care provider and adjust them as directed. It is normal to feel mild stretching, pulling, tightness, or discomfort as you do these exercises, but you should stop right away if you feel sudden pain or your pain gets worse. Do not begin these exercises until told by your health care provider. Stretching and range of motion exercise This exercise warms up your muscles and joints and improves the movement and flexibility of your neck and shoulder. This exercise also helps to relieve pain and stiffness. Exercise A: Cervical side bend  1. Using good posture, sit on a stable chair, or stand up. 2. Without moving your shoulders, slowly tilt your left / right ear toward your left / right shoulder until you feel a stretch in your neck muscles. You should be looking straight ahead. 3. Hold for __________ seconds. 4. Slowly return to the starting position. 5. Repeat on your left / right side. Repeat __________ times. Complete this exercise __________ times a day. Strengthening exercises These exercises build strength and endurance in your shoulder. Endurance is the ability to use your muscles for a long time, even after they get tired. Exercise B: Scapular protraction, supine  1. Lie on your back on a firm surface. Hold a __________ weight in your left / right hand. 2. Raise your left / right arm straight into the air so your hand is directly above your shoulder joint. 3. Push the weight into the air so your shoulder lifts off of the surface that you are lying on. Do not move your head, neck, or back. 4. Hold for __________ seconds. 5. Slowly return to the starting position. Let your muscles relax completely before you repeat this  exercise. Repeat __________ times. Complete this exercise __________ times a day. Exercise C: Scapular retraction  1. Sit in a stable chair without armrests, or stand. 2. Secure an exercise band to a stable object in front of you so the band is at shoulder height. 3. Hold one end of the exercise band in each hand. Your palms should face down. 4. Squeeze your shoulder blades together and move your elbows slightly behind you. Do not shrug your shoulders while you do this. 5.  Hold for __________ seconds. 6. Slowly return to the starting position. Repeat __________ times. Complete this exercise __________ times a day. Exercise D: Shoulder extension with scapular retraction  1. Sit in a stable chair without armrests, or stand. 2. Secure an exercise band to a stable object in front of you where it is above shoulder height. 3. Hold one end of the exercise band in each hand. 4. Straighten your elbows and lift your hands up to shoulder height. 5. Squeeze your shoulder blades together and pull your hands down to the sides of your thighs. Stop when your hands are straight down by your sides. Do not let your hands go behind your body. 6. Hold for __________ seconds. 7. Slowly return to the starting position. Repeat __________ times. Complete this exercise __________ times a day. Exercise E: Shoulder abduction 1. Sit in a stable chair without armrests, or stand. 2. If directed, hold a __________ weight in your left / right hand. 3. Start with your arms straight down. Turn your left / right hand so your palm faces in, toward your body. 4. Slowly lift your left / right hand out to your side. Do not lift your hand above shoulder height. ? Keep your arms straight. ? Avoid shrugging your shoulder while you do this movement. Keep your shoulder blade tucked down toward the middle of your back. 5. Hold for __________ seconds. 6. Slowly lower your arm, and return to the starting position. Repeat __________  times. Complete this exercise __________ times a day. This information is not intended to replace advice given to you by your health care provider. Make sure you discuss any questions you have with your health care provider. Document Released: 04/26/2005 Document Revised: 01/01/2016 Document Reviewed: 03/29/2015 Elsevier Interactive Patient Education  [employer]2018 Elsevier Inc.

## 2016-11-09 LAB — HEPATITIS C ANTIBODY: HCV Ab: NEGATIVE

## 2016-11-10 ENCOUNTER — Encounter: Payer: Self-pay | Admitting: Family

## 2016-11-16 ENCOUNTER — Telehealth: Payer: Self-pay | Admitting: Family

## 2016-11-16 NOTE — Telephone Encounter (Signed)
Sherita from [employer] called about a Prior Auth for [employer]. She said that the one that was completed online asked the wrong questions. A form will be faxed over with the correct questions needed for this prior auth.

## 2016-11-17 NOTE — Telephone Encounter (Signed)
Alinda Moneyony with [employer] [phone] Reference number [id number]  Has questions for PA

## 2016-11-19 NOTE — Telephone Encounter (Signed)
Spoke with [employer] in regards to the PA below.

## 2016-12-13 MED ORDER — LOSARTAN 50 MG TABLET
ORAL_TABLET | Freq: Two times a day (BID) | ORAL | 5 refills | 0 days | Status: CP
Start: 2016-12-13 — End: 2017-06-11

## 2017-04-12 DIAGNOSIS — Z1211 Encounter for screening for malignant neoplasm of colon: Secondary | ICD-10-CM | POA: Diagnosis not present

## 2017-04-12 DIAGNOSIS — Z01818 Encounter for other preprocedural examination: Secondary | ICD-10-CM | POA: Diagnosis not present

## 2017-04-20 ENCOUNTER — Ambulatory Visit
Admission: RE | Admit: 2017-04-20 | Discharge: 2017-04-20 | Disposition: A | Discharge status: Home / Self Care | Payer: MEDICARE

## 2017-04-20 DIAGNOSIS — R0782 Intercostal pain: Principal | ICD-10-CM

## 2017-06-03 DIAGNOSIS — K5730 Diverticulosis of large intestine without perforation or abscess without bleeding: Secondary | ICD-10-CM | POA: Diagnosis not present

## 2017-06-03 DIAGNOSIS — Z1211 Encounter for screening for malignant neoplasm of colon: Secondary | ICD-10-CM | POA: Diagnosis not present

## 2017-06-03 DIAGNOSIS — D126 Benign neoplasm of colon, unspecified: Secondary | ICD-10-CM | POA: Diagnosis not present

## 2017-06-07 DIAGNOSIS — D126 Benign neoplasm of colon, unspecified: Secondary | ICD-10-CM | POA: Diagnosis not present

## 2017-06-13 MED ORDER — LOSARTAN 50 MG TABLET
ORAL_TABLET | 5 refills | 0 days | Status: CP
Start: 2017-06-13 — End: 2017-08-04

## 2017-07-11 MED ORDER — METOPROLOL SUCCINATE ER 50 MG TABLET,EXTENDED RELEASE 24 HR
ORAL_TABLET | 3 refills | 0 days | Status: CP
Start: 2017-07-11 — End: 2017-11-21

## 2017-08-04 MED ORDER — LOSARTAN 50 MG TABLET
ORAL_TABLET | Freq: Two times a day (BID) | ORAL | 3 refills | 0.00000 days | Status: CP
Start: 2017-08-04 — End: 2018-10-10

## 2017-09-29 ENCOUNTER — Encounter: Admit: 2017-09-29 | Discharge: 2017-09-30 | Payer: MEDICARE | Attending: Internal Medicine | Primary: Internal Medicine

## 2017-09-29 ENCOUNTER — Encounter: Admit: 2017-09-29 | Discharge: 2017-10-01 | Payer: MEDICARE

## 2017-09-29 DIAGNOSIS — E782 Mixed hyperlipidemia: Secondary | ICD-10-CM

## 2017-09-29 DIAGNOSIS — I10 Essential (primary) hypertension: Principal | ICD-10-CM

## 2017-09-29 DIAGNOSIS — R42 Dizziness and giddiness: Secondary | ICD-10-CM

## 2017-09-29 DIAGNOSIS — R0602 Shortness of breath: Principal | ICD-10-CM

## 2017-09-29 DIAGNOSIS — I2510 Atherosclerotic heart disease of native coronary artery without angina pectoris: Secondary | ICD-10-CM

## 2017-09-29 DIAGNOSIS — R1310 Dysphagia, unspecified: Secondary | ICD-10-CM

## 2017-11-21 MED ORDER — METOPROLOL SUCCINATE ER 50 MG TABLET,EXTENDED RELEASE 24 HR
ORAL_TABLET | 3 refills | 0 days | Status: CP
Start: 2017-11-21 — End: 2018-10-25

## 2017-11-25 DIAGNOSIS — S0096XA Insect bite (nonvenomous) of unspecified part of head, initial encounter: Secondary | ICD-10-CM | POA: Diagnosis not present

## 2017-12-09 ENCOUNTER — Encounter: Admit: 2017-12-09 | Discharge: 2017-12-10 | Payer: MEDICARE

## 2017-12-09 DIAGNOSIS — E785 Hyperlipidemia, unspecified: Secondary | ICD-10-CM

## 2017-12-09 DIAGNOSIS — Z79899 Other long term (current) drug therapy: Principal | ICD-10-CM

## 2017-12-09 DIAGNOSIS — I10 Essential (primary) hypertension: Secondary | ICD-10-CM

## 2018-02-06 ENCOUNTER — Emergency Department (HOSPITAL_COMMUNITY): Payer: BLUE CROSS/BLUE SHIELD

## 2018-02-06 ENCOUNTER — Emergency Department (HOSPITAL_COMMUNITY)
Admission: EM | Admit: 2018-02-06 | Discharge: 2018-02-06 | Disposition: A | Discharge status: Home / Self Care | Payer: BLUE CROSS/BLUE SHIELD | Attending: Emergency Medicine | Admitting: Emergency Medicine

## 2018-02-06 ENCOUNTER — Encounter (HOSPITAL_COMMUNITY): Payer: Self-pay | Admitting: Emergency Medicine

## 2018-02-06 DIAGNOSIS — S6991XA Unspecified injury of right wrist, hand and finger(s), initial encounter: Secondary | ICD-10-CM

## 2018-02-06 DIAGNOSIS — M79644 Pain in right finger(s): Secondary | ICD-10-CM | POA: Insufficient documentation

## 2018-02-06 DIAGNOSIS — M79641 Pain in right hand: Secondary | ICD-10-CM | POA: Diagnosis not present

## 2018-02-06 DIAGNOSIS — Z79899 Other long term (current) drug therapy: Secondary | ICD-10-CM | POA: Diagnosis not present

## 2018-02-06 NOTE — ED Provider Notes (Signed)
[facility] EMERGENCY DEPARTMENT Provider Note   CSN: [id number] Arrival date & time: 02/06/18  1642     History   Chief Complaint Chief Complaint  Patient presents with  . Finger Injury    HPI [name] Le is a 67 y.o. male.  The history is provided by the patient and medical records. No language interpreter was used.   [name] Le is a 67 y.o. male  with a PMH as listed below who presents to the Emergency Department complaining of right ring finger pain x 4-5 days. Patient states that he was lifting something heavy and this finger took the majority of the pressure. Felt like the finger hyperextending. He has had pain to the knuckle of this hand since the injury. No pain if holding finger still. Pain reproduced by movement or palpation. No meds taken prior to arrival for symptoms. No numbness, tingling, weakness, color change.   Past Medical History:  Diagnosis Date  . Closed fracture of tuft of distal phalanx of finger 90's   left  . Closed fracture of tuft of distal phalanx of right thumb with malunion May '14   ORIF with pin ([facility])  . ED (erectile dysfunction)   . GERD (gastroesophageal reflux disease)   . Lumbar degenerative disc disease    no problems that limit activity  . MRSA (methicillin resistant staph aureus) culture positive 06/12/2012   left axilla    Patient Active Problem List   Diagnosis Date Noted  . Encounter for general adult medical examination with abnormal findings 11/08/2016  . Impingement syndrome of left shoulder 11/08/2016  . Routine health maintenance 11/13/2012  . Lumbar degenerative disc disease   . GERD (gastroesophageal reflux disease)   . MRSA (methicillin resistant staph aureus) culture positive 06/12/2012    Past Surgical History:  Procedure Laterality Date  . LACERATION REPAIR Left    chain laceration left forearm - 13 stitches.  . TONSILLECTOMY    . tuft fracture Left may '14   ORIF with PIN ( [facility])         Home Medications    Prior to Admission medications   Medication Sig Start Date End Date Taking? Authorizing Provider  Diclofenac Sodium (PENNSAID) 2 % SOLN Place 1 application onto the skin 2 (two) times daily as needed. 11/08/16   Veryl Speak, FNP  ibuprofen (ADVIL,MOTRIN) 200 MG tablet Take 200 mg by mouth every 6 (six) hours as needed.    [provider]  Ibuprofen-Famotidine 800-26.6 MG TABS Take 1 tablet by mouth 3 (three) times daily as needed. 11/08/16   Veryl Speak, FNP  ranitidine (ZANTAC) 75 MG tablet Take 75 mg by mouth 2 (two) times daily.    [provider]    Family History Family History  Problem Relation Age of Onset  . Lung cancer Mother   . Cancer Mother        lung with mets    Social History Social History   Tobacco Use  . Smoking status: Never Smoker  . Smokeless tobacco: Never Used  Substance Use Topics  . Alcohol use: Yes    Comment: Sporadic - 2-3 / month  . Drug use: No     Allergies   Patient has no known allergies.   Review of Systems Review of Systems  Musculoskeletal: Positive for arthralgias.  Skin: Negative for color change.  Neurological: Negative for weakness and numbness.  Hematological: Does not bruise/bleed easily.     Physical  Exam Updated Vital Signs BP (!) 136/93 (BP Location: Right Arm)   Pulse 68   Temp 98.8 F (37.1 C) (Oral)   Resp 18   SpO2 100%   Physical Exam  Constitutional: He appears well-developed and well-nourished. No distress.  HENT:  Head: Normocephalic and atraumatic.  Neck: Neck supple.  Cardiovascular: Normal rate, regular rhythm and normal heart sounds.  No murmur heard. Pulmonary/Chest: Effort normal and breath sounds normal. No respiratory distress. He has no wheezes. He has no rales.  Musculoskeletal:  Tenderness to palpation of right ringer finger MCP. Full ROM and strong grip strength to right hand. Good cap refill. 2+ radial pulse.  Neurological: He is  alert.  Skin: Skin is warm and dry.  Nursing note and vitals reviewed.    ED Treatments / Results  Labs (all labs ordered are listed, but only abnormal results are displayed) Labs Reviewed - No data to display  EKG None  Radiology Dg Hand Complete Right  Result Date: 02/06/2018 CLINICAL DATA:  Right hand pain EXAM: RIGHT HAND - COMPLETE 3+ VIEW COMPARISON:  None. FINDINGS: Minor degenerative changes of the interphalangeal joints, right first MTP joint, and the carpal bones. These joints demonstrate minor joint space loss and sclerosis. Subchondral cysts noted of the carpal bones. Normal alignment. No acute osseous finding, subluxation or dislocation. No soft tissue abnormality. IMPRESSION: Minor degenerative changes. No acute osseous finding by plain radiography Electronically Signed   By: Judie Petit.  Shick M.D.   On: 02/06/2018 17:30    Procedures Procedures (including critical care time)  Medications Ordered in ED Medications - No data to display   Initial Impression / Assessment and Plan / ED Course  I have reviewed the triage vital signs and the nursing notes.  Pertinent labs & imaging results that were available during my care of the patient were reviewed by me and considered in my medical decision making (see chart for details).    [name] Le is a 67 y.o. male who presents to ED for right ringer finger pain after injury about 5 days ago. NVI on exam. X-ray obtained showing no acute findings. Will treat symptomatically and have patient follow up with ortho if symptoms persist. All questions answered.   Final Clinical Impressions(s) / ED Diagnoses   Final diagnoses:  Injury of finger of right hand, initial encounter    ED Discharge Orders    None       Alfretta Pinch, Chase Picket, PA-C 02/06/18 1741    Gwyneth Sprout, MD 02/07/18 [phone]

## 2018-02-06 NOTE — Discharge Instructions (Signed)
It was my pleasure taking care of you today!   Tylenol or ibuprofen as needed for pain.  Continue icing area for pain relief as well.   If symptoms are not improving in 1 week, please call the orthopedic doctor listed to schedule a follow up appointment.   Return to ER for new or worsening symptoms, any additional concerns.

## 2018-02-06 NOTE — ED Triage Notes (Signed)
Pt to ER for evaluation of right ring finger pain after hyperextending it one week ago. No discoloration, tenderness to knuckle.

## 2018-06-08 ENCOUNTER — Encounter: Admit: 2018-06-08 | Discharge: 2018-06-09 | Payer: MEDICARE

## 2018-06-08 DIAGNOSIS — Z79899 Other long term (current) drug therapy: Principal | ICD-10-CM

## 2018-06-08 DIAGNOSIS — I10 Essential (primary) hypertension: Secondary | ICD-10-CM

## 2018-07-19 ENCOUNTER — Ambulatory Visit: Payer: BLUE CROSS/BLUE SHIELD | Admitting: Family Medicine

## 2018-10-04 ENCOUNTER — Encounter: Admit: 2018-10-04 | Discharge: 2018-10-05 | Payer: MEDICARE | Attending: Internal Medicine | Primary: Internal Medicine

## 2018-10-04 DIAGNOSIS — R42 Dizziness and giddiness: Secondary | ICD-10-CM

## 2018-10-04 DIAGNOSIS — R0602 Shortness of breath: Secondary | ICD-10-CM

## 2018-10-04 DIAGNOSIS — I471 Supraventricular tachycardia: Secondary | ICD-10-CM

## 2018-10-04 DIAGNOSIS — I2510 Atherosclerotic heart disease of native coronary artery without angina pectoris: Secondary | ICD-10-CM

## 2018-10-04 DIAGNOSIS — R5383 Other fatigue: Secondary | ICD-10-CM

## 2018-10-04 DIAGNOSIS — E782 Mixed hyperlipidemia: Secondary | ICD-10-CM

## 2018-10-04 DIAGNOSIS — R1310 Dysphagia, unspecified: Secondary | ICD-10-CM

## 2018-10-04 DIAGNOSIS — I10 Essential (primary) hypertension: Principal | ICD-10-CM

## 2018-10-04 MED ORDER — ROPINIROLE 0.5 MG TABLET
ORAL_TABLET | Freq: Every evening | ORAL | 3 refills | 0 days | Status: CP | PRN
Start: 2018-10-04 — End: 2019-10-04

## 2018-10-10 MED ORDER — LOSARTAN 100 MG TABLET
ORAL_TABLET | 2 refills | 0 days | Status: CP
Start: 2018-10-10 — End: ?

## 2018-10-25 MED ORDER — METOPROLOL SUCCINATE ER 50 MG TABLET,EXTENDED RELEASE 24 HR
ORAL_TABLET | 3 refills | 0 days | Status: CP
Start: 2018-10-25 — End: ?

## 2019-01-25 ENCOUNTER — Encounter: Admit: 2019-01-25 | Discharge: 2019-01-27 | Payer: MEDICARE

## 2019-01-25 ENCOUNTER — Ambulatory Visit: Admit: 2019-01-25 | Discharge: 2019-01-27 | Payer: MEDICARE

## 2019-01-25 ENCOUNTER — Encounter: Admit: 2019-01-25 | Discharge: 2019-01-27 | Payer: MEDICARE | Attending: Anesthesiology | Primary: Anesthesiology

## 2019-01-25 DIAGNOSIS — Z79899 Other long term (current) drug therapy: Secondary | ICD-10-CM

## 2019-01-25 DIAGNOSIS — I110 Hypertensive heart disease with heart failure: Secondary | ICD-10-CM

## 2019-01-25 DIAGNOSIS — K5190 Ulcerative colitis, unspecified, without complications: Secondary | ICD-10-CM

## 2019-01-25 DIAGNOSIS — K219 Gastro-esophageal reflux disease without esophagitis: Secondary | ICD-10-CM

## 2019-01-25 DIAGNOSIS — Z86010 Personal history of colonic polyps: Secondary | ICD-10-CM

## 2019-01-25 DIAGNOSIS — I509 Heart failure, unspecified: Secondary | ICD-10-CM

## 2019-01-25 DIAGNOSIS — Z86718 Personal history of other venous thrombosis and embolism: Secondary | ICD-10-CM

## 2019-01-25 DIAGNOSIS — M797 Fibromyalgia: Secondary | ICD-10-CM

## 2019-01-25 DIAGNOSIS — Z7982 Long term (current) use of aspirin: Secondary | ICD-10-CM

## 2019-01-25 DIAGNOSIS — K922 Gastrointestinal hemorrhage, unspecified: Secondary | ICD-10-CM

## 2019-01-25 DIAGNOSIS — R600 Localized edema: Secondary | ICD-10-CM

## 2019-01-25 DIAGNOSIS — M19011 Primary osteoarthritis, right shoulder: Secondary | ICD-10-CM

## 2019-01-25 DIAGNOSIS — M19012 Primary osteoarthritis, left shoulder: Secondary | ICD-10-CM

## 2019-01-25 DIAGNOSIS — I2510 Atherosclerotic heart disease of native coronary artery without angina pectoris: Secondary | ICD-10-CM

## 2019-01-25 DIAGNOSIS — I824Y2 Acute embolism and thrombosis of unspecified deep veins of left proximal lower extremity: Secondary | ICD-10-CM

## 2019-01-25 DIAGNOSIS — I82412 Acute embolism and thrombosis of left femoral vein: Secondary | ICD-10-CM

## 2019-01-25 DIAGNOSIS — M329 Systemic lupus erythematosus, unspecified: Secondary | ICD-10-CM

## 2019-01-25 DIAGNOSIS — Z885 Allergy status to narcotic agent status: Secondary | ICD-10-CM

## 2019-01-25 DIAGNOSIS — N3000 Acute cystitis without hematuria: Secondary | ICD-10-CM

## 2019-01-25 DIAGNOSIS — E785 Hyperlipidemia, unspecified: Secondary | ICD-10-CM

## 2019-01-25 DIAGNOSIS — I471 Supraventricular tachycardia: Secondary | ICD-10-CM

## 2019-01-25 DIAGNOSIS — R350 Frequency of micturition: Secondary | ICD-10-CM

## 2019-01-25 DIAGNOSIS — Z9049 Acquired absence of other specified parts of digestive tract: Secondary | ICD-10-CM

## 2019-01-25 DIAGNOSIS — Z791 Long term (current) use of non-steroidal anti-inflammatories (NSAID): Secondary | ICD-10-CM

## 2019-01-25 DIAGNOSIS — M79652 Pain in left thigh: Secondary | ICD-10-CM

## 2019-01-25 DIAGNOSIS — Z8719 Personal history of other diseases of the digestive system: Secondary | ICD-10-CM

## 2019-01-26 DIAGNOSIS — I509 Heart failure, unspecified: Secondary | ICD-10-CM

## 2019-01-26 DIAGNOSIS — M19012 Primary osteoarthritis, left shoulder: Secondary | ICD-10-CM

## 2019-01-26 DIAGNOSIS — K922 Gastrointestinal hemorrhage, unspecified: Secondary | ICD-10-CM

## 2019-01-26 DIAGNOSIS — R350 Frequency of micturition: Secondary | ICD-10-CM

## 2019-01-26 DIAGNOSIS — R600 Localized edema: Secondary | ICD-10-CM

## 2019-01-26 DIAGNOSIS — Z86010 Personal history of colonic polyps: Secondary | ICD-10-CM

## 2019-01-26 DIAGNOSIS — K219 Gastro-esophageal reflux disease without esophagitis: Secondary | ICD-10-CM

## 2019-01-26 DIAGNOSIS — Z885 Allergy status to narcotic agent status: Secondary | ICD-10-CM

## 2019-01-26 DIAGNOSIS — M79652 Pain in left thigh: Secondary | ICD-10-CM

## 2019-01-26 DIAGNOSIS — Z79899 Other long term (current) drug therapy: Secondary | ICD-10-CM

## 2019-01-26 DIAGNOSIS — I110 Hypertensive heart disease with heart failure: Secondary | ICD-10-CM

## 2019-01-26 DIAGNOSIS — I82412 Acute embolism and thrombosis of left femoral vein: Secondary | ICD-10-CM

## 2019-01-26 DIAGNOSIS — M19011 Primary osteoarthritis, right shoulder: Secondary | ICD-10-CM

## 2019-01-26 DIAGNOSIS — I2510 Atherosclerotic heart disease of native coronary artery without angina pectoris: Secondary | ICD-10-CM

## 2019-01-26 DIAGNOSIS — E785 Hyperlipidemia, unspecified: Secondary | ICD-10-CM

## 2019-01-26 DIAGNOSIS — I471 Supraventricular tachycardia: Secondary | ICD-10-CM

## 2019-01-26 DIAGNOSIS — Z7982 Long term (current) use of aspirin: Secondary | ICD-10-CM

## 2019-01-26 DIAGNOSIS — Z791 Long term (current) use of non-steroidal anti-inflammatories (NSAID): Secondary | ICD-10-CM

## 2019-01-26 DIAGNOSIS — Z86718 Personal history of other venous thrombosis and embolism: Secondary | ICD-10-CM

## 2019-01-26 DIAGNOSIS — Z9049 Acquired absence of other specified parts of digestive tract: Secondary | ICD-10-CM

## 2019-01-26 DIAGNOSIS — M797 Fibromyalgia: Secondary | ICD-10-CM

## 2019-01-26 DIAGNOSIS — K5190 Ulcerative colitis, unspecified, without complications: Secondary | ICD-10-CM

## 2019-01-26 DIAGNOSIS — M329 Systemic lupus erythematosus, unspecified: Secondary | ICD-10-CM

## 2019-01-26 DIAGNOSIS — I824Y2 Acute embolism and thrombosis of unspecified deep veins of left proximal lower extremity: Secondary | ICD-10-CM

## 2019-01-27 DIAGNOSIS — R350 Frequency of micturition: Secondary | ICD-10-CM

## 2019-01-27 DIAGNOSIS — K219 Gastro-esophageal reflux disease without esophagitis: Secondary | ICD-10-CM

## 2019-01-27 DIAGNOSIS — I471 Supraventricular tachycardia: Secondary | ICD-10-CM

## 2019-01-27 DIAGNOSIS — Z86010 Personal history of colonic polyps: Secondary | ICD-10-CM

## 2019-01-27 DIAGNOSIS — M19011 Primary osteoarthritis, right shoulder: Secondary | ICD-10-CM

## 2019-01-27 DIAGNOSIS — I2510 Atherosclerotic heart disease of native coronary artery without angina pectoris: Secondary | ICD-10-CM

## 2019-01-27 DIAGNOSIS — K922 Gastrointestinal hemorrhage, unspecified: Secondary | ICD-10-CM

## 2019-01-27 DIAGNOSIS — Z86718 Personal history of other venous thrombosis and embolism: Secondary | ICD-10-CM

## 2019-01-27 DIAGNOSIS — E785 Hyperlipidemia, unspecified: Secondary | ICD-10-CM

## 2019-01-27 DIAGNOSIS — I824Y2 Acute embolism and thrombosis of unspecified deep veins of left proximal lower extremity: Secondary | ICD-10-CM

## 2019-01-27 DIAGNOSIS — M797 Fibromyalgia: Secondary | ICD-10-CM

## 2019-01-27 DIAGNOSIS — M19012 Primary osteoarthritis, left shoulder: Secondary | ICD-10-CM

## 2019-01-27 DIAGNOSIS — Z885 Allergy status to narcotic agent status: Secondary | ICD-10-CM

## 2019-01-27 DIAGNOSIS — R600 Localized edema: Secondary | ICD-10-CM

## 2019-01-27 DIAGNOSIS — I509 Heart failure, unspecified: Secondary | ICD-10-CM

## 2019-01-27 DIAGNOSIS — Z9049 Acquired absence of other specified parts of digestive tract: Secondary | ICD-10-CM

## 2019-01-27 DIAGNOSIS — M329 Systemic lupus erythematosus, unspecified: Secondary | ICD-10-CM

## 2019-01-27 DIAGNOSIS — I110 Hypertensive heart disease with heart failure: Secondary | ICD-10-CM

## 2019-01-27 DIAGNOSIS — M79652 Pain in left thigh: Secondary | ICD-10-CM

## 2019-01-27 DIAGNOSIS — Z79899 Other long term (current) drug therapy: Secondary | ICD-10-CM

## 2019-01-27 DIAGNOSIS — Z791 Long term (current) use of non-steroidal anti-inflammatories (NSAID): Secondary | ICD-10-CM

## 2019-01-27 DIAGNOSIS — K5190 Ulcerative colitis, unspecified, without complications: Secondary | ICD-10-CM

## 2019-01-27 DIAGNOSIS — Z7982 Long term (current) use of aspirin: Secondary | ICD-10-CM

## 2019-01-27 DIAGNOSIS — I82412 Acute embolism and thrombosis of left femoral vein: Secondary | ICD-10-CM

## 2019-01-27 MED ORDER — CEPHALEXIN 500 MG CAPSULE
ORAL_CAPSULE | Freq: Two times a day (BID) | ORAL | 0 refills | 3 days | Status: CP
Start: 2019-01-27 — End: 2019-02-01

## 2019-01-27 MED ORDER — APIXABAN 2.5 MG TABLET
ORAL_TABLET | Freq: Two times a day (BID) | ORAL | 0 refills | 30 days | Status: CP
Start: 2019-01-27 — End: 2019-02-26

## 2019-01-28 MED ORDER — BACITRACIN 500 UNIT/GRAM TOPICAL OINTMENT
Freq: Every day | TOPICAL | 0 refills | 0 days | Status: CP
Start: 2019-01-28 — End: ?

## 2019-02-01 ENCOUNTER — Encounter: Admit: 2019-02-01 | Discharge: 2019-02-02 | Payer: MEDICARE | Attending: Adult Health | Primary: Adult Health

## 2019-02-01 DIAGNOSIS — I82412 Acute embolism and thrombosis of left femoral vein: Secondary | ICD-10-CM

## 2019-02-01 DIAGNOSIS — I2510 Atherosclerotic heart disease of native coronary artery without angina pectoris: Secondary | ICD-10-CM

## 2019-02-01 DIAGNOSIS — I471 Supraventricular tachycardia: Secondary | ICD-10-CM

## 2019-02-01 DIAGNOSIS — I499 Cardiac arrhythmia, unspecified: Secondary | ICD-10-CM

## 2019-02-01 DIAGNOSIS — R0602 Shortness of breath: Secondary | ICD-10-CM

## 2019-02-01 DIAGNOSIS — I10 Essential (primary) hypertension: Secondary | ICD-10-CM

## 2019-02-01 DIAGNOSIS — E782 Mixed hyperlipidemia: Secondary | ICD-10-CM

## 2019-02-02 ENCOUNTER — Encounter: Admit: 2019-02-02 | Discharge: 2019-02-03 | Payer: MEDICARE

## 2019-02-02 DIAGNOSIS — I509 Heart failure, unspecified: Secondary | ICD-10-CM

## 2019-02-02 DIAGNOSIS — I110 Hypertensive heart disease with heart failure: Secondary | ICD-10-CM

## 2019-02-02 DIAGNOSIS — Z885 Allergy status to narcotic agent status: Secondary | ICD-10-CM

## 2019-02-02 DIAGNOSIS — M797 Fibromyalgia: Secondary | ICD-10-CM

## 2019-02-02 DIAGNOSIS — I82419 Acute embolism and thrombosis of unspecified femoral vein: Secondary | ICD-10-CM

## 2019-02-02 DIAGNOSIS — I2510 Atherosclerotic heart disease of native coronary artery without angina pectoris: Secondary | ICD-10-CM

## 2019-02-02 DIAGNOSIS — Z7901 Long term (current) use of anticoagulants: Secondary | ICD-10-CM

## 2019-02-02 DIAGNOSIS — Z79899 Other long term (current) drug therapy: Secondary | ICD-10-CM

## 2019-02-02 DIAGNOSIS — Z9049 Acquired absence of other specified parts of digestive tract: Secondary | ICD-10-CM

## 2019-02-02 DIAGNOSIS — E785 Hyperlipidemia, unspecified: Secondary | ICD-10-CM

## 2019-02-02 DIAGNOSIS — I824Y2 Acute embolism and thrombosis of unspecified deep veins of left proximal lower extremity: Secondary | ICD-10-CM

## 2019-02-02 DIAGNOSIS — M79662 Pain in left lower leg: Secondary | ICD-10-CM

## 2019-02-02 DIAGNOSIS — Z86010 Personal history of colonic polyps: Secondary | ICD-10-CM

## 2019-02-02 MED ORDER — RIVAROXABAN 10 MG TABLET
ORAL_TABLET | Freq: Every day | ORAL | 5 refills | 30 days | Status: CP
Start: 2019-02-02 — End: ?

## 2019-02-28 ENCOUNTER — Encounter: Admit: 2019-02-28 | Discharge: 2019-03-01 | Payer: MEDICARE

## 2019-02-28 DIAGNOSIS — Z7901 Long term (current) use of anticoagulants: Principal | ICD-10-CM

## 2019-02-28 DIAGNOSIS — I10 Essential (primary) hypertension: Principal | ICD-10-CM

## 2019-03-19 ENCOUNTER — Emergency Department
Admit: 2019-03-19 | Discharge: 2019-03-19 | Disposition: A | Discharge status: Home / Self Care | Payer: MEDICARE | Attending: Emergency Medicine

## 2019-03-19 ENCOUNTER — Ambulatory Visit
Admit: 2019-03-19 | Discharge: 2019-03-19 | Disposition: A | Discharge status: Home / Self Care | Payer: MEDICARE | Attending: Emergency Medicine

## 2019-03-19 DIAGNOSIS — T148XXA Other injury of unspecified body region, initial encounter: Principal | ICD-10-CM

## 2019-04-03 ENCOUNTER — Encounter: Admit: 2019-04-03 | Discharge: 2019-04-04 | Payer: MEDICARE

## 2019-04-03 DIAGNOSIS — S5012XD Contusion of left forearm, subsequent encounter: Principal | ICD-10-CM

## 2019-04-17 ENCOUNTER — Encounter: Admit: 2019-04-17 | Discharge: 2019-04-18 | Payer: MEDICARE

## 2019-05-25 ENCOUNTER — Encounter: Admit: 2019-05-25 | Discharge: 2019-05-26 | Payer: MEDICARE

## 2019-05-25 DIAGNOSIS — R2242 Localized swelling, mass and lump, left lower limb: Principal | ICD-10-CM

## 2019-06-19 ENCOUNTER — Encounter: Admit: 2019-06-19 | Discharge: 2019-06-20 | Payer: MEDICARE

## 2019-06-19 DIAGNOSIS — T148XXA Other injury of unspecified body region, initial encounter: Principal | ICD-10-CM

## 2019-07-03 ENCOUNTER — Ambulatory Visit: Admit: 2019-07-03 | Discharge: 2019-07-04 | Payer: MEDICARE

## 2019-07-03 DIAGNOSIS — K5190 Ulcerative colitis, unspecified, without complications: Secondary | ICD-10-CM

## 2019-07-03 DIAGNOSIS — R7982 Elevated C-reactive protein (CRP): Principal | ICD-10-CM

## 2019-07-03 DIAGNOSIS — M659 Synovitis and tenosynovitis, unspecified: Principal | ICD-10-CM

## 2019-07-03 DIAGNOSIS — M4980 Spondylopathy in diseases classified elsewhere, site unspecified: Secondary | ICD-10-CM

## 2019-07-03 DIAGNOSIS — M329 Systemic lupus erythematosus, unspecified: Principal | ICD-10-CM

## 2019-07-09 MED ORDER — LOSARTAN 100 MG TABLET
ORAL_TABLET | 2 refills | 0 days | Status: CP
Start: 2019-07-09 — End: ?

## 2019-07-23 MED ORDER — ROPINIROLE 0.5 MG TABLET
ORAL_TABLET | 3 refills | 0 days | Status: CP
Start: 2019-07-23 — End: ?

## 2019-09-13 ENCOUNTER — Encounter
Admit: 2019-09-13 | Discharge: 2019-09-13 | Disposition: A | Discharge status: Home / Self Care | Payer: MEDICARE | Attending: Student in an Organized Health Care Education/Training Program

## 2019-09-13 ENCOUNTER — Emergency Department
Admit: 2019-09-13 | Discharge: 2019-09-13 | Disposition: A | Discharge status: Home / Self Care | Payer: MEDICARE | Attending: Student in an Organized Health Care Education/Training Program

## 2019-09-13 DIAGNOSIS — M79604 Pain in right leg: Principal | ICD-10-CM

## 2019-09-13 DIAGNOSIS — J019 Acute sinusitis, unspecified: Principal | ICD-10-CM

## 2019-09-13 DIAGNOSIS — N3000 Acute cystitis without hematuria: Principal | ICD-10-CM

## 2019-09-13 MED ORDER — HYDROCODONE 5 MG-ACETAMINOPHEN 325 MG TABLET
ORAL_TABLET | Freq: Four times a day (QID) | ORAL | 0 refills | 3 days | Status: CP | PRN
Start: 2019-09-13 — End: 2019-09-18

## 2019-09-13 MED ORDER — FLUTICASONE PROPIONATE 50 MCG/ACTUATION NASAL SPRAY,SUSPENSION
Freq: Every day | NASAL | 0 refills | 0 days | Status: CP
Start: 2019-09-13 — End: 2020-09-12

## 2019-09-13 MED ORDER — CEFDINIR 300 MG CAPSULE
ORAL_CAPSULE | Freq: Two times a day (BID) | ORAL | 0 refills | 7 days | Status: CP
Start: 2019-09-13 — End: 2019-09-20

## 2019-09-24 IMAGING — DX DG HAND COMPLETE 3+V*R*
3 series · 3 of 3 positions shown · non-contrast
Comparison: None.

CLINICAL DATA: Right hand pain

EXAM:
RIGHT HAND - COMPLETE 3+ VIEW

[hand ap]
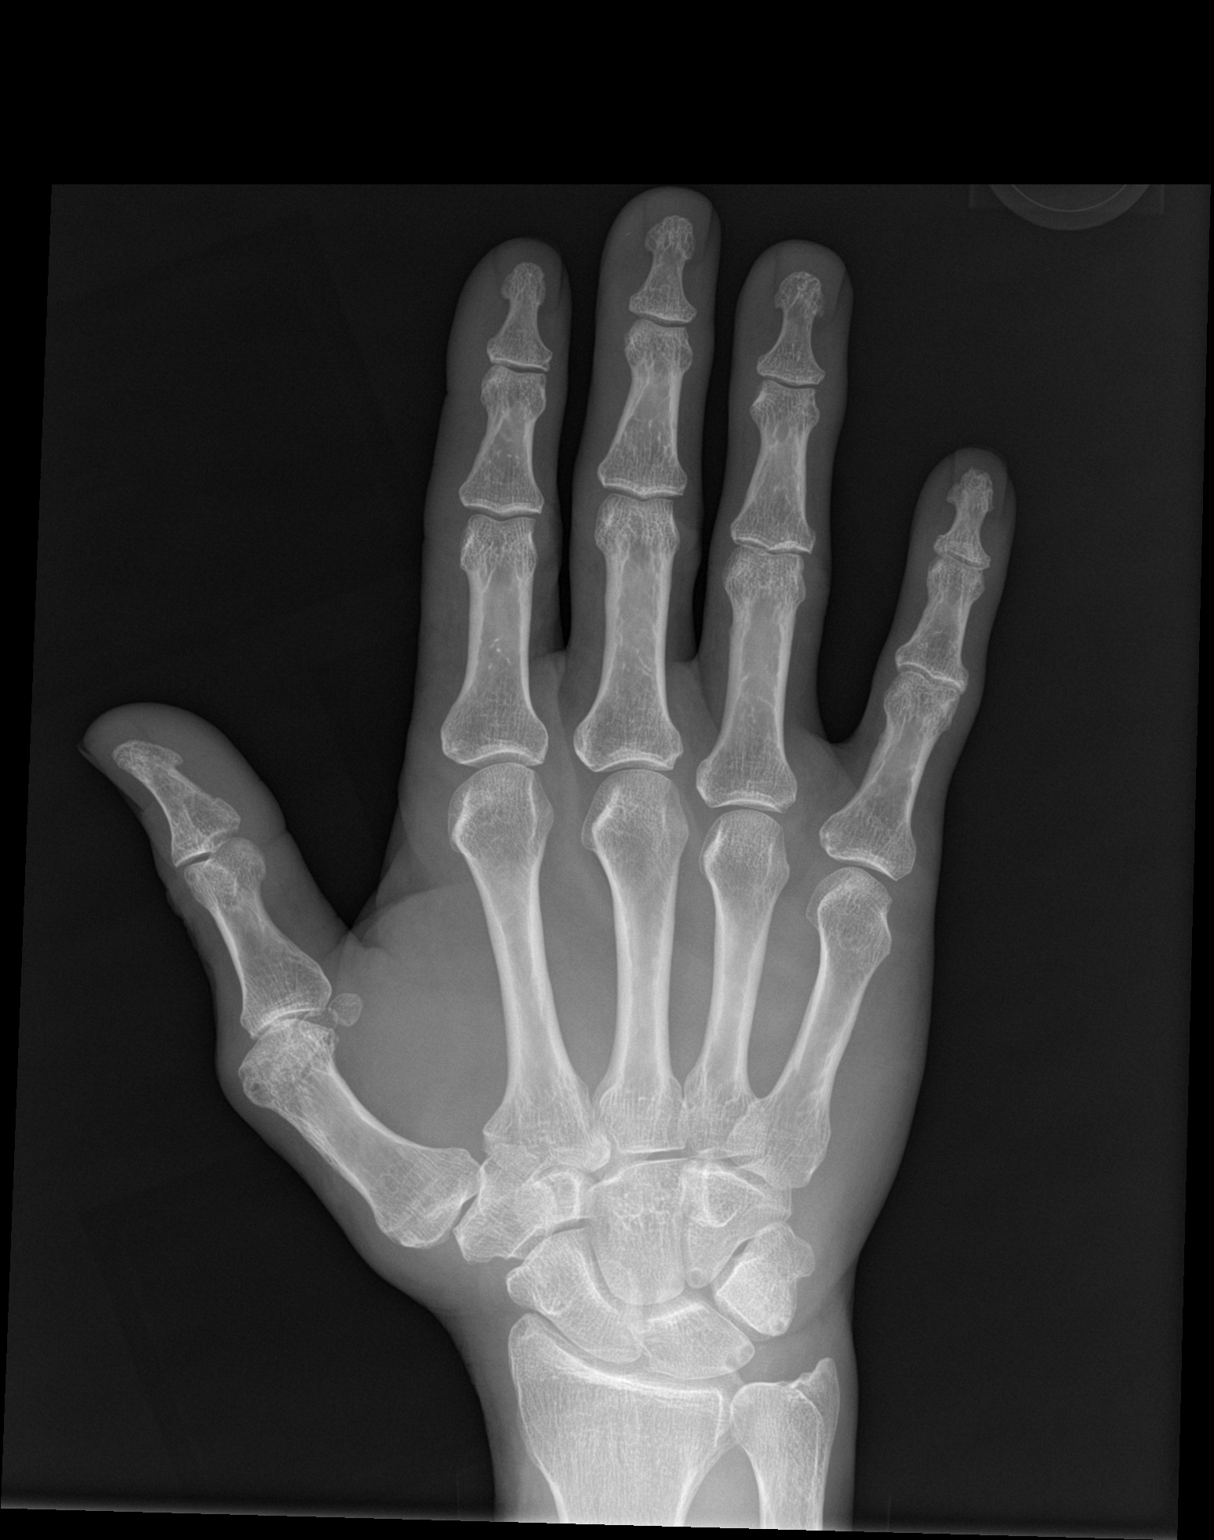

[hand obl]
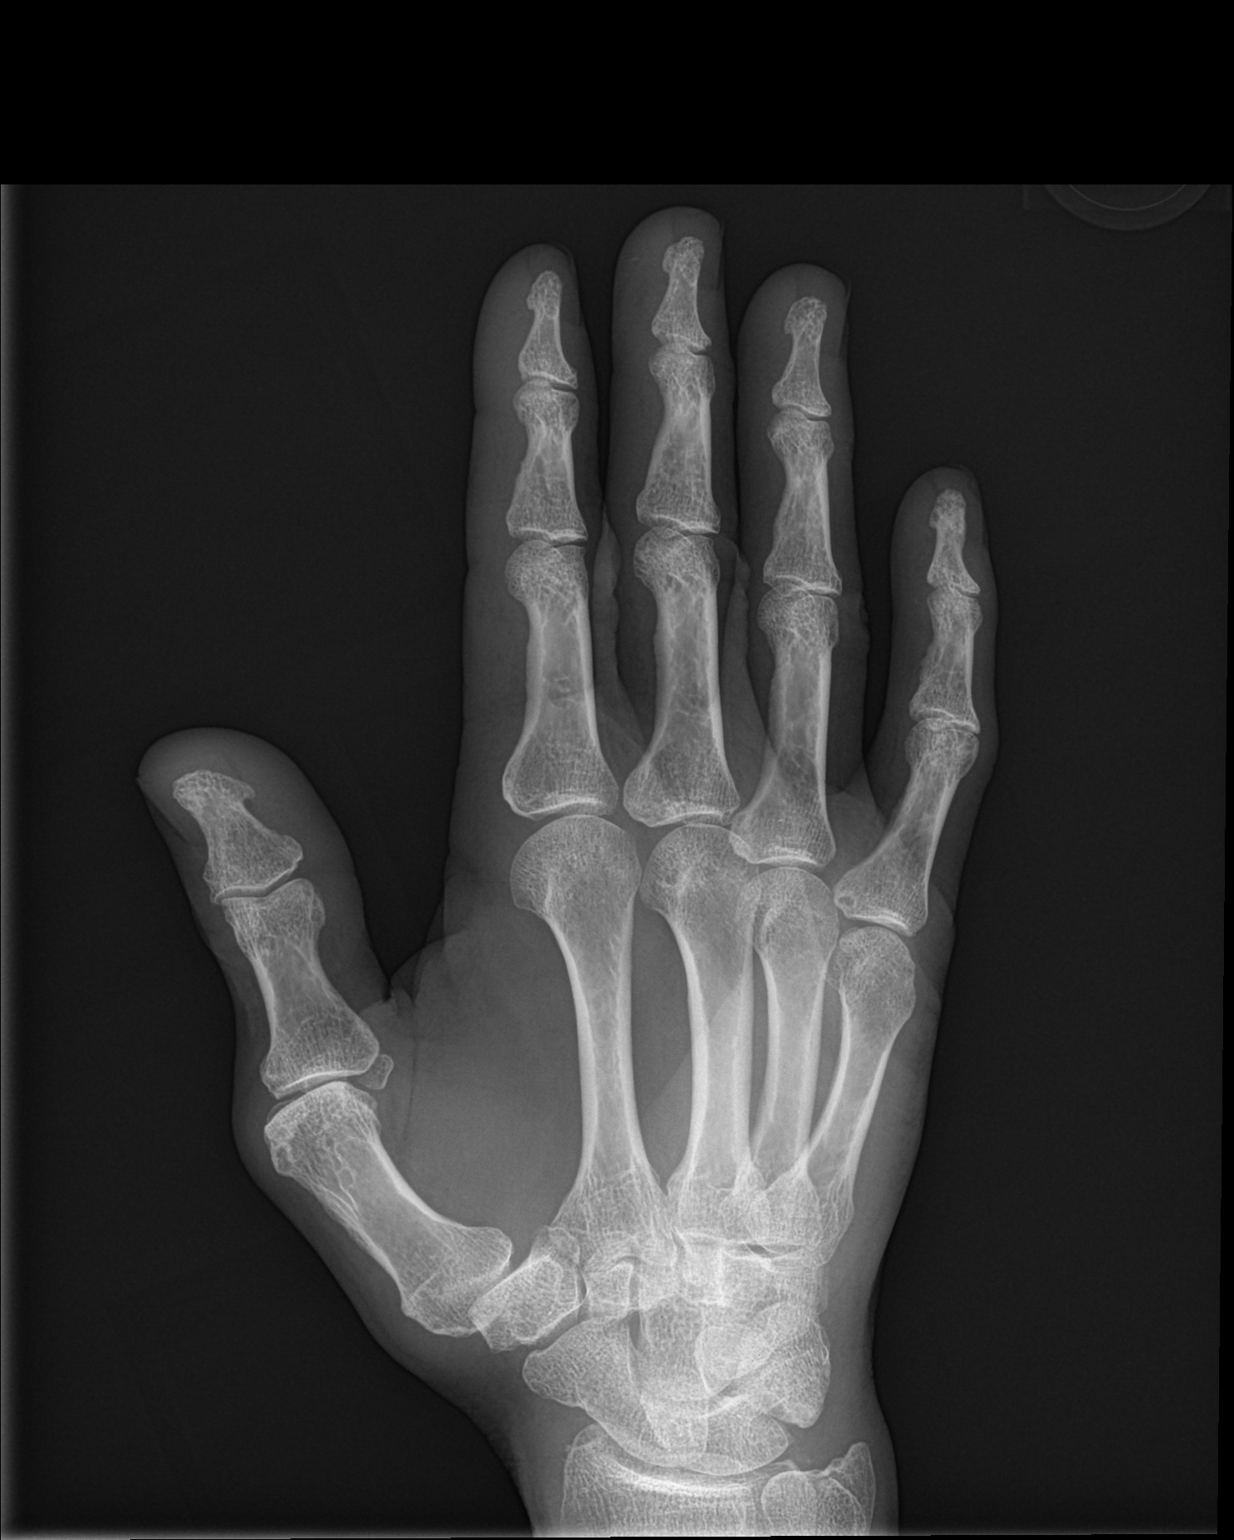

[hand lat]
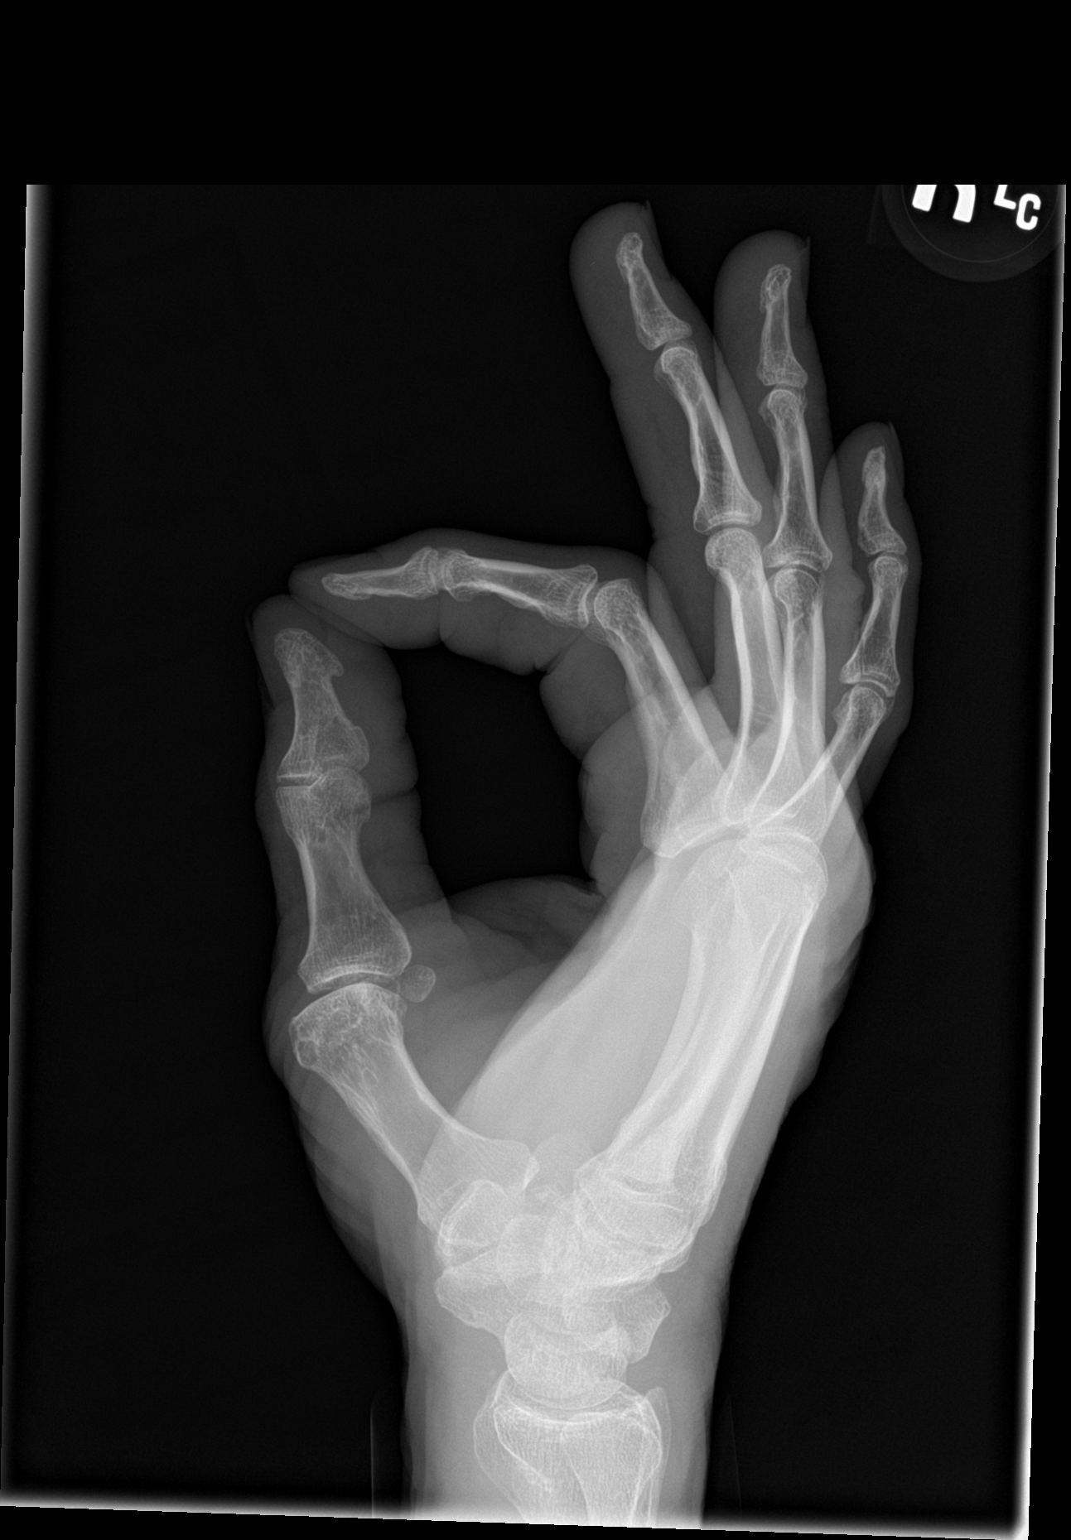

[3 of 3 positions shown; findings below may reference images not displayed]

FINDINGS: Minor degenerative changes of the interphalangeal joints, right
first MTP joint, and the carpal bones. These joints demonstrate
minor joint space loss and sclerosis. Subchondral cysts noted of the
carpal bones. Normal alignment. No acute osseous finding,
subluxation or dislocation. No soft tissue abnormality.
IMPRESSION: Minor degenerative changes. No acute osseous finding by plain
radiography

## 2019-10-02 ENCOUNTER — Encounter: Admit: 2019-10-02 | Discharge: 2019-10-03 | Payer: MEDICARE | Attending: Adult Health | Primary: Adult Health

## 2019-10-02 DIAGNOSIS — Z86718 Personal history of other venous thrombosis and embolism: Principal | ICD-10-CM

## 2019-10-02 DIAGNOSIS — R0602 Shortness of breath: Principal | ICD-10-CM

## 2019-10-02 DIAGNOSIS — I10 Essential (primary) hypertension: Principal | ICD-10-CM

## 2019-10-02 DIAGNOSIS — E782 Mixed hyperlipidemia: Principal | ICD-10-CM

## 2019-10-02 DIAGNOSIS — I2510 Atherosclerotic heart disease of native coronary artery without angina pectoris: Principal | ICD-10-CM

## 2019-10-29 MED ORDER — METOPROLOL SUCCINATE ER 50 MG TABLET,EXTENDED RELEASE 24 HR
ORAL_TABLET | 3 refills | 0 days | Status: CP
Start: 2019-10-29 — End: ?

## 2019-12-02 ENCOUNTER — Ambulatory Visit
Admission: EM | Admit: 2019-12-02 | Discharge: 2019-12-02 | Disposition: A | Discharge status: Home / Self Care | Payer: Medicare Other

## 2019-12-02 ENCOUNTER — Other Ambulatory Visit: Payer: Self-pay

## 2019-12-02 DIAGNOSIS — R0981 Nasal congestion: Secondary | ICD-10-CM | POA: Diagnosis not present

## 2019-12-02 DIAGNOSIS — R059 Cough, unspecified: Secondary | ICD-10-CM

## 2019-12-02 DIAGNOSIS — J029 Acute pharyngitis, unspecified: Secondary | ICD-10-CM

## 2019-12-02 MED ORDER — FLUTICASONE PROPIONATE 50 MCG/ACT NA SUSP
2.0000 | Freq: Every day | NASAL | 0 refills | Status: DC
Start: 2019-12-02 — End: 2022-06-10

## 2019-12-02 MED ORDER — AZELASTINE HCL 0.1 % NA SOLN
2.0000 | Freq: Two times a day (BID) | NASAL | 0 refills | Status: DC
Start: 2019-12-02 — End: 2022-06-10

## 2019-12-02 MED ORDER — PREDNISONE 50 MG PO TABS
50.0000 mg | ORAL_TABLET | Freq: Every day | ORAL | 0 refills | Status: DC
Start: 2019-12-02 — End: 2021-01-25

## 2019-12-02 NOTE — Discharge Instructions (Signed)
COVID PCR testing ordered. I would like you to quarantine until testing results. Prednisone for sinus pressure, cough. Flonase for nasal congestion/drainage, can add on azelastine nasal spray if needed. Keep hydrated, your urine should be clear to pale yellow in color. Tylenol/motrin for fever and pain. Monitor for any worsening of symptoms, chest pain, shortness of breath, wheezing, swelling of the throat, go to the emergency department for further evaluation needed.

## 2019-12-02 NOTE — ED Triage Notes (Signed)
Patient c/o sore throat, mild nasal congestion and dry cough. COVID vaccine complete

## 2019-12-02 NOTE — ED Provider Notes (Signed)
[facility]    CSN: [id number] Arrival date & time: 12/02/19  0855      History   Chief Complaint Chief Complaint  Patient presents with  . Sore Throat  . Nasal Congestion  . Cough    HPI [name] Le is a 69 y.o. male.   69 year old male comes in for 1 week history of URI symptoms. Sore throat, nasal congestion, cough that is nonproductive. Sore throat worse in the morning and when talking more. Denies fever, chills, body aches. Denies abdominal pain, nausea, vomiting, diarrhea. Denies shortness of breath, loss of taste/smell. Fully COVID vaccinated.      Past Medical History:  Diagnosis Date  . Closed fracture of tuft of distal phalanx of finger 90's   left  . Closed fracture of tuft of distal phalanx of right thumb with malunion May '14   ORIF with pin ([place])  . ED (erectile dysfunction)   . GERD (gastroesophageal reflux disease)   . Lumbar degenerative disc disease    no problems that limit activity  . MRSA (methicillin resistant staph aureus) culture positive 06/12/2012   left axilla    Patient Active Problem List   Diagnosis Date Noted  . Encounter for general adult medical examination with abnormal findings 11/08/2016  . Impingement syndrome of left shoulder 11/08/2016  . Routine health maintenance 11/13/2012  . Lumbar degenerative disc disease   . GERD (gastroesophageal reflux disease)   . MRSA (methicillin resistant staph aureus) culture positive 06/12/2012    Past Surgical History:  Procedure Laterality Date  . LACERATION REPAIR Left    chain laceration left forearm - 13 stitches.  . TONSILLECTOMY    . tuft fracture Left may '14   ORIF with PIN ( [place])       Home Medications    Prior to Admission medications   Medication Sig Start Date End Date Taking? Authorizing Provider  Anastrozole (ARIMIDEX PO) anastrozole    [provider]  azelastine (ASTELIN) 0.1 % nasal spray Place 2 sprays into both nostrils 2 (two)  times daily. 12/02/19   Cathie Hoops, Alonnah Lampkins V, PA-C  fluticasone (FLONASE) 50 MCG/ACT nasal spray Place 2 sprays into both nostrils daily. 12/02/19   Belinda Fisher, PA-C  predniSONE (DELTASONE) 50 MG tablet Take 1 tablet (50 mg total) by mouth daily with breakfast. 12/02/19   Belinda Fisher, PA-C    Family History Family History  Problem Relation Age of Onset  . Lung cancer Mother   . Cancer Mother        lung with mets    Social History Social History   Tobacco Use  . Smoking status: Never Smoker  . Smokeless tobacco: Never Used  Vaping Use  . Vaping Use: Never used  Substance Use Topics  . Alcohol use: Yes    Comment: Sporadic - 2-3 / month  . Drug use: No     Allergies   Patient has no known allergies.   Review of Systems Review of Systems  Reason unable to perform ROS: See HPI as above.     Physical Exam Triage Vital Signs ED Triage Vitals  Enc Vitals Group     BP 12/02/19 0918 (!) 141/96     Pulse Rate 12/02/19 0918 58     Resp 12/02/19 0918 14     Temp 12/02/19 0918 97.8 F (36.6 C)     Temp Source 12/02/19 0918 Oral     SpO2 12/02/19 0918 96 %  Weight --      Height --      Head Circumference --      Peak Flow --      Pain Score 12/02/19 0920 0     Pain Loc --      Pain Edu? --      Excl. in GC? --    No data found.  Updated Vital Signs BP (!) 141/96 (BP Location: Right Arm)   Pulse 58   Temp 97.8 F (36.6 C) (Oral)   Resp 14   SpO2 96%    Physical Exam Constitutional:      General: He is not in acute distress.    Appearance: He is well-developed. He is not ill-appearing, toxic-appearing or diaphoretic.  HENT:     Head: Normocephalic and atraumatic.     Right Ear: Tympanic membrane, ear canal and external ear normal. Tympanic membrane is not erythematous or bulging.     Left Ear: Tympanic membrane, ear canal and external ear normal. Tympanic membrane is not erythematous or bulging.     Nose:     Right Sinus: Maxillary sinus tenderness present. No  frontal sinus tenderness.     Left Sinus: Maxillary sinus tenderness present. No frontal sinus tenderness.     Mouth/Throat:     Mouth: Mucous membranes are moist.     Pharynx: Oropharynx is clear. Uvula midline.  Eyes:     Conjunctiva/sclera: Conjunctivae normal.     Pupils: Pupils are equal, round, and reactive to light.  Cardiovascular:     Rate and Rhythm: Normal rate and regular rhythm.  Pulmonary:     Effort: Pulmonary effort is normal. No accessory muscle usage, prolonged expiration, respiratory distress or retractions.     Breath sounds: No decreased air movement or transmitted upper airway sounds. No decreased breath sounds.     Comments: LCTAB Musculoskeletal:     Cervical back: Normal range of motion and neck supple.  Skin:    General: Skin is warm and dry.  Neurological:     Mental Status: He is alert and oriented to person, place, and time.      UC Treatments / Results  Labs (all labs ordered are listed, but only abnormal results are displayed) Labs Reviewed  NOVEL CORONAVIRUS, NAA    EKG   Radiology No results found.  Procedures Procedures (including critical care time)  Medications Ordered in UC Medications - No data to display  Initial Impression / Assessment and Plan / UC Course  I have reviewed the triage vital signs and the nursing notes.  Pertinent labs & imaging results that were available during my care of the patient were reviewed by me and considered in my medical decision making (see chart for details).    COVID testing ordered, patient to quarantine until testing results return. Afebrile, nontoxic. LCTAB. Symptomatic treatment discussed. Return precautions given.  Final Clinical Impressions(s) / UC Diagnoses   Final diagnoses:  Nasal congestion  Cough  Sore throat   ED Prescriptions    Medication Sig Dispense Auth. Provider   predniSONE (DELTASONE) 50 MG tablet Take 1 tablet (50 mg total) by mouth daily with breakfast. 5 tablet Michah Minton,  Talise Sligh V, PA-C   fluticasone (FLONASE) 50 MCG/ACT nasal spray Place 2 sprays into both nostrils daily. 1 g Charlee Whitebread V, PA-C   azelastine (ASTELIN) 0.1 % nasal spray Place 2 sprays into both nostrils 2 (two) times daily. 30 mL Belinda Fisher, PA-C     PDMP not  reviewed this encounter.   Belinda Fisher, PA-C 12/02/19 [phone]

## 2019-12-03 LAB — NOVEL CORONAVIRUS, NAA: SARS-CoV-2, NAA: NOT DETECTED

## 2019-12-03 LAB — SARS-COV-2, NAA 2 DAY TAT

## 2020-09-26 MED ORDER — METOPROLOL SUCCINATE ER 50 MG TABLET,EXTENDED RELEASE 24 HR
ORAL_TABLET | 3 refills | 0 days | Status: CP
Start: 2020-09-26 — End: ?

## 2020-09-29 ENCOUNTER — Institutional Professional Consult (permissible substitution): Admit: 2020-09-29 | Discharge: 2020-09-30 | Payer: MEDICARE

## 2020-09-29 ENCOUNTER — Ambulatory Visit: Admit: 2020-09-29 | Discharge: 2020-10-01 | Payer: MEDICARE

## 2020-09-29 DIAGNOSIS — I2510 Atherosclerotic heart disease of native coronary artery without angina pectoris: Principal | ICD-10-CM

## 2020-10-02 ENCOUNTER — Ambulatory Visit: Admit: 2020-10-02 | Discharge: 2020-10-03 | Payer: MEDICARE | Attending: Internal Medicine | Primary: Internal Medicine

## 2020-10-02 DIAGNOSIS — R0602 Shortness of breath: Principal | ICD-10-CM

## 2020-10-02 DIAGNOSIS — Z86718 Personal history of other venous thrombosis and embolism: Principal | ICD-10-CM

## 2020-10-02 DIAGNOSIS — I10 Essential (primary) hypertension: Principal | ICD-10-CM

## 2020-10-02 DIAGNOSIS — R0789 Other chest pain: Principal | ICD-10-CM

## 2020-10-02 DIAGNOSIS — I25119 Atherosclerotic heart disease of native coronary artery with unspecified angina pectoris: Principal | ICD-10-CM

## 2020-10-03 ENCOUNTER — Ambulatory Visit: Admit: 2020-10-03 | Discharge: 2020-10-04 | Payer: MEDICARE

## 2020-10-07 DIAGNOSIS — I25119 Atherosclerotic heart disease of native coronary artery with unspecified angina pectoris: Principal | ICD-10-CM

## 2020-10-07 DIAGNOSIS — R0602 Shortness of breath: Principal | ICD-10-CM

## 2020-10-07 DIAGNOSIS — R079 Chest pain, unspecified: Principal | ICD-10-CM

## 2020-10-10 ENCOUNTER — Ambulatory Visit: Admit: 2020-10-10 | Discharge: 2020-10-11 | Payer: MEDICARE

## 2020-10-14 ENCOUNTER — Ambulatory Visit: Admit: 2020-10-14 | Discharge: 2020-10-14 | Payer: MEDICARE

## 2020-10-14 MED ORDER — ROSUVASTATIN 10 MG TABLET
ORAL_TABLET | Freq: Every day | ORAL | 0 refills | 30 days | Status: CP
Start: 2020-10-14 — End: 2020-11-13

## 2020-10-14 MED ORDER — CLOPIDOGREL 75 MG TABLET
ORAL_TABLET | Freq: Every day | ORAL | 3 refills | 90 days | Status: CP
Start: 2020-10-14 — End: 2020-11-13

## 2020-10-22 MED ORDER — ROSUVASTATIN 20 MG TABLET
ORAL_TABLET | Freq: Every day | ORAL | 3 refills | 90 days | Status: CP
Start: 2020-10-22 — End: ?

## 2020-11-11 ENCOUNTER — Ambulatory Visit: Admit: 2020-11-11 | Payer: MEDICARE | Attending: Adult Health | Primary: Adult Health

## 2020-11-13 MED ORDER — NITROGLYCERIN 0.4 MG SUBLINGUAL TABLET
ORAL_TABLET | SUBLINGUAL | 3 refills | 1 days | Status: CP | PRN
Start: 2020-11-13 — End: 2021-11-13

## 2020-11-27 ENCOUNTER — Ambulatory Visit: Admit: 2020-11-27 | Discharge: 2020-11-28 | Payer: MEDICARE | Attending: Adult Health | Primary: Adult Health

## 2020-11-27 DIAGNOSIS — R0602 Shortness of breath: Principal | ICD-10-CM

## 2020-11-27 DIAGNOSIS — Z86718 Personal history of other venous thrombosis and embolism: Principal | ICD-10-CM

## 2020-11-27 DIAGNOSIS — E782 Mixed hyperlipidemia: Principal | ICD-10-CM

## 2020-11-27 DIAGNOSIS — I10 Essential (primary) hypertension: Principal | ICD-10-CM

## 2020-11-27 DIAGNOSIS — I25119 Atherosclerotic heart disease of native coronary artery with unspecified angina pectoris: Principal | ICD-10-CM

## 2020-11-27 MED ORDER — ROSUVASTATIN 20 MG TABLET
ORAL_TABLET | Freq: Every day | ORAL | 3 refills | 180 days
Start: 2020-11-27 — End: ?

## 2020-11-27 MED ORDER — ISOSORBIDE MONONITRATE ER 30 MG TABLET,EXTENDED RELEASE 24 HR
ORAL_TABLET | Freq: Every day | ORAL | 11 refills | 30 days | Status: CP
Start: 2020-11-27 — End: ?

## 2020-12-01 MED ORDER — RANOLAZINE ER 500 MG TABLET,EXTENDED RELEASE,12 HR
ORAL_TABLET | Freq: Two times a day (BID) | ORAL | 6 refills | 30 days | Status: CP
Start: 2020-12-01 — End: ?

## 2021-01-25 ENCOUNTER — Other Ambulatory Visit: Payer: Self-pay

## 2021-01-25 ENCOUNTER — Ambulatory Visit
Admission: EM | Admit: 2021-01-25 | Discharge: 2021-01-25 | Disposition: A | Discharge status: Home / Self Care | Payer: Medicare Other

## 2021-01-25 DIAGNOSIS — M545 Low back pain, unspecified: Secondary | ICD-10-CM | POA: Diagnosis not present

## 2021-01-25 DIAGNOSIS — S39012A Strain of muscle, fascia and tendon of lower back, initial encounter: Secondary | ICD-10-CM

## 2021-01-25 DIAGNOSIS — M5136 Other intervertebral disc degeneration, lumbar region: Secondary | ICD-10-CM

## 2021-01-25 MED ORDER — TIZANIDINE HCL 4 MG PO TABS
4.0000 mg | ORAL_TABLET | Freq: Every day | ORAL | 0 refills | Status: DC
Start: 2021-01-25 — End: 2022-06-10

## 2021-01-25 MED ORDER — PREDNISONE 50 MG PO TABS
50.0000 mg | ORAL_TABLET | Freq: Every day | ORAL | 0 refills | Status: DC
Start: 2021-01-25 — End: 2022-06-10

## 2021-01-25 NOTE — ED Provider Notes (Signed)
[facility]   MRN: [id number] DOB: 1951/02/21  Subjective:   [name] Le is a 70 y.o. male presenting for 4-day history of acute onset persistent right sided lower back pain.  Patient states that he was carrying a toolbox and as he bent and twisted to put it down felt like he pulled something.  Denies fever, nausea, vomiting, hematuria, history of kidney stones, radicular symptoms, weakness, numbness or tingling.  No falls, trauma.  Patient does have a history of lumbar degenerative disc disease.  Has been using Tylenol and ibuprofen without any relief.  Last time he hurt himself like this he did well with the steroid course and would like to have the same.  No history of diabetes.  No history of heart disease.  No current facility-administered medications for this encounter.  Current Outpatient Medications:    omeprazole (PRILOSEC) 20 MG capsule, Take 20 mg by mouth daily., Disp: , Rfl:    Anastrozole (ARIMIDEX PO), anastrozole, Disp: , Rfl:    azelastine (ASTELIN) 0.1 % nasal spray, Place 2 sprays into both nostrils 2 (two) times daily., Disp: 30 mL, Rfl: 0   fluticasone (FLONASE) 50 MCG/ACT nasal spray, Place 2 sprays into both nostrils daily., Disp: 1 g, Rfl: 0   predniSONE (DELTASONE) 50 MG tablet, Take 1 tablet (50 mg total) by mouth daily with breakfast., Disp: 5 tablet, Rfl: 0   No Known Allergies  Past Medical History:  Diagnosis Date   Closed fracture of tuft of distal phalanx of finger 90's   left   Closed fracture of tuft of distal phalanx of right thumb with malunion May '14   ORIF with pin [place])   ED (erectile dysfunction)    GERD (gastroesophageal reflux disease)    Lumbar degenerative disc disease    no problems that limit activity   MRSA (methicillin resistant staph aureus) culture positive 06/12/2012   left axilla     Past Surgical History:  Procedure Laterality Date   LACERATION REPAIR Left    chain laceration left forearm - 13 stitches.    TONSILLECTOMY     tuft fracture Left may '14   ORIF with PIN ( [place])    Family History  Problem Relation Age of Onset   Lung cancer Mother    Cancer Mother        lung with mets    Social History   Tobacco Use   Smoking status: Never   Smokeless tobacco: Never  Vaping Use   Vaping Use: Never used  Substance Use Topics   Alcohol use: Yes    Comment: Sporadic - 2-3 / month   Drug use: No    ROS   Objective:   Vitals: BP (!) 158/91 (BP Location: Left Arm)   Pulse 84   Temp 97.6 F (36.4 C) (Oral)   Resp 18   SpO2 98%   BP Readings from Last 3 Encounters:  01/25/21 (!) 158/91  12/02/19 (!) 141/96  02/06/18 (!) 136/93   Physical Exam Constitutional:      General: He is not in acute distress.    Appearance: Normal appearance. He is well-developed and normal weight. He is not ill-appearing, toxic-appearing or diaphoretic.  HENT:     Head: Normocephalic and atraumatic.     Right Ear: External ear normal.     Left Ear: External ear normal.     Nose: Nose normal.     Mouth/Throat:     Pharynx: Oropharynx is clear.  Eyes:  General: No scleral icterus.       Right eye: No discharge.        Left eye: No discharge.     Extraocular Movements: Extraocular movements intact.     Pupils: Pupils are equal, round, and reactive to light.  Cardiovascular:     Rate and Rhythm: Normal rate.  Pulmonary:     Effort: Pulmonary effort is normal.  Musculoskeletal:     Cervical back: Normal range of motion.     Lumbar back: Spasms and tenderness present. No swelling, edema, deformity, signs of trauma, lacerations or bony tenderness. Normal range of motion. Negative right straight leg raise test and negative left straight leg raise test. No scoliosis.  Skin:    General: Skin is warm and dry.  Neurological:     Mental Status: He is alert and oriented to person, place, and time.     Motor: No weakness.     Coordination: Coordination normal.     Gait: Gait normal.      Deep Tendon Reflexes: Reflexes normal.  Psychiatric:        Mood and Affect: Mood normal.        Behavior: Behavior normal.        Thought Content: Thought content normal.        Judgment: Judgment normal.     Assessment and Plan :   PDMP not reviewed this encounter.  1. Acute right-sided low back pain without sciatica   2. Lumbar strain, initial encounter   3. Lumbar degenerative disc disease     Given excellent physical exam findings, lack of midline, spinous process tenderness will defer imaging.  Will manage conservatively for back strain with prednisone as he had not responded to Tylenol and NSAID.  Will use muscle relaxant, rest and modification of physical activity.  Anticipatory guidance provided.  Counseled patient on potential for adverse effects with medications prescribed/recommended today, ER and return-to-clinic precautions discussed, patient verbalized understanding.    Wallis Bamberg, [place] 01/25/21 [phone]

## 2021-01-25 NOTE — ED Triage Notes (Signed)
4 days ago, Pt was bending down to pick up a tool box, while he was transitioning back to a standing position he twisted causing a sharp pain on his right lower back. Pt reports the pain lessened, but increased two days later when he changed his water heater. Pain is interfering with his sleep. Has been taking advil with some relief. Bending aggravates sxs.

## 2021-02-14 ENCOUNTER — Ambulatory Visit: Admit: 2021-02-14 | Discharge: 2021-02-14 | Disposition: A | Discharge status: Home / Self Care | Payer: MEDICARE

## 2021-02-14 ENCOUNTER — Emergency Department: Admit: 2021-02-14 | Discharge: 2021-02-14 | Disposition: A | Discharge status: Home / Self Care | Payer: MEDICARE

## 2021-02-14 DIAGNOSIS — N3000 Acute cystitis without hematuria: Principal | ICD-10-CM

## 2021-02-14 MED ORDER — CEFDINIR 300 MG CAPSULE
ORAL_CAPSULE | Freq: Two times a day (BID) | ORAL | 0 refills | 10.00000 days | Status: CP
Start: 2021-02-14 — End: 2021-02-24

## 2021-05-06 ENCOUNTER — Ambulatory Visit: Admit: 2021-05-06 | Discharge: 2021-05-07 | Payer: MEDICARE | Attending: Internal Medicine | Primary: Internal Medicine

## 2021-05-06 DIAGNOSIS — I10 Essential (primary) hypertension: Principal | ICD-10-CM

## 2021-05-06 DIAGNOSIS — E782 Mixed hyperlipidemia: Principal | ICD-10-CM

## 2021-05-06 DIAGNOSIS — Z95828 Presence of other vascular implants and grafts: Principal | ICD-10-CM

## 2021-05-06 DIAGNOSIS — I25119 Atherosclerotic heart disease of native coronary artery with unspecified angina pectoris: Principal | ICD-10-CM

## 2021-05-06 DIAGNOSIS — Z86718 Personal history of other venous thrombosis and embolism: Principal | ICD-10-CM

## 2021-05-06 DIAGNOSIS — R0602 Shortness of breath: Principal | ICD-10-CM

## 2021-05-06 MED ORDER — EZETIMIBE 10 MG TABLET
ORAL_TABLET | Freq: Every day | ORAL | 3 refills | 90 days | Status: CP
Start: 2021-05-06 — End: 2022-05-06

## 2021-06-09 MED ORDER — RANOLAZINE ER 500 MG TABLET,EXTENDED RELEASE,12 HR
ORAL_TABLET | Freq: Two times a day (BID) | ORAL | 3 refills | 90 days | Status: CP
Start: 2021-06-09 — End: ?

## 2021-06-10 ENCOUNTER — Ambulatory Visit
Admit: 2021-06-10 | Discharge: 2021-06-10 | Payer: MEDICARE | Attending: Pharmacist Clinician (PhC)/ Clinical Pharmacy Specialist | Primary: Pharmacist Clinician (PhC)/ Clinical Pharmacy Specialist

## 2021-06-10 DIAGNOSIS — E782 Mixed hyperlipidemia: Principal | ICD-10-CM

## 2021-06-10 MED ORDER — REPATHA SURECLICK 140 MG/ML SUBCUTANEOUS PEN INJECTOR
SUBCUTANEOUS | 11 refills | 0 days | Status: CP
Start: 2021-06-10 — End: ?

## 2021-06-11 DIAGNOSIS — E782 Mixed hyperlipidemia: Principal | ICD-10-CM

## 2021-06-22 DIAGNOSIS — M25561 Pain in right knee: Principal | ICD-10-CM

## 2021-06-25 MED ORDER — PRALUENT PEN 75 MG/ML SUBCUTANEOUS PEN INJECTOR
SUBCUTANEOUS | 11 refills | 0 days | Status: CP
Start: 2021-06-25 — End: ?
  Filled 2021-07-06: qty 2, 28d supply, fill #0

## 2021-06-26 DIAGNOSIS — I25119 Atherosclerotic heart disease of native coronary artery with unspecified angina pectoris: Principal | ICD-10-CM

## 2021-07-01 NOTE — Unmapped (Signed)
[facility] Specialty Medication Onboarding    Specialty Medication: PRALUENT PEN 75 mg/mL Pnij (alirocumab)  Prior Authorization: Not Required   Financial Assistance: No - patient declined financial assistance per MAPs referral  Final Copay/Day Supply: $47 / 62    Insurance Restrictions: None     Notes to Pharmacist:     The triage team has completed the benefits investigation and has determined that the patient is able to fill this medication at [facility] [place]. Please contact the patient to complete the onboarding or follow up with the prescribing physician as needed.

## 2021-07-02 MED ORDER — EMPTY CONTAINER
2 refills | 0 days
Start: 2021-07-02 — End: ?

## 2021-07-02 NOTE — Unmapped (Signed)
[facility]   Patient Onboarding/Medication Counseling    Nathaniel Page is a 71 y.o. male with CAD who I am counseling today on initiation of therapy.  I am speaking to the patient's family member, wife.    Was a Nurse, learning disability used for this call? No    Verified patient's date of birth / HIPAA.    Specialty medication(s) to be sent: General Specialty: Praluent      Non-specialty medications/supplies to be sent: Higher education careers adviser      Medications not needed at this time: n/a         Praluent (alirocumab)    Medication & Administration     Dosage: Inject the contents of 1 pen (75mg ) under the skin every 2 weeks.    Administration: Inject under the skin of the thigh, abdomen or upper arm. Rotate sites with each injection.  ??? Injection instructions - Pen:  o Take 1 (or 2) pens out of the refrigerator and allow to stand at room temperature for 30 to 40 minutes  o Check the pen(s) for the following:  - Expiration date  - Absence of damage or cracks  - Medication is clear, colorless or pale yellow and free from particles  o Choose your injection site and clean with alcohol wipe. Allow to air dry completely.  o Pull the blue needle cap straight off and discard  o Hold the pen in your palm with your thumb over the green button, but do not touch the green button yet.  o Press the pen unto your skin at a 90 degree angle until the yellow safety cover disappears  o Push the green button and immediately release. You will hear a ???click.??? This signifies that the injection has started.  o Continue to hold the pen in place, maintaining pressure, until the entire window has turned yellow.  - This may take up to 20 seconds.  - You may hear a second ???click??? when the injection is complete.  o Lift the pen straight off your skin and dispose of it in a sharps container  o If there is blood at the injection site gently press a cotton ball or guaze to the site. Do not rub the injection site.    Adherence/Missed dose instructions: Administer a missed dose within 7 days and resume your normal schedule. If it has been more than 7 days and you inject every 2 weeks, skip the missed dose and resume your normal schedule..     Goals of Therapy     Lower cholesterol  Lower the risk of heart attack, stroke and unstable angina in people with heart disease    Side Effects & Monitoring Parameters   ??? Injection site irritation  ??? Flu-like symptoms  ??? Nose or throat irritation    The following side effects should be reported to the provider:  ??? Signs of an allergic reaction    Contraindications, Warnings, & Precautions     ??? Hypersensitivity    Drug/Food Interactions     ??? Medication list reviewed in Epic. The patient was instructed to inform the care team before taking any new medications or supplements. No drug interactions identified.     Storage, Handling Precautions, & Disposal     ??? Praluent should be stored in the refrigerator.   o If necessary Praluent may be stored at room temperature, in the original carton, for no more than 30 days.  ??? Place used devices into a sharps container for  disposal      Current Medications (including OTC/herbals), Comorbidities and Allergies     Current Outpatient Medications   Medication Sig Dispense Refill   ??? alirocumab (PRALUENT PEN) 75 mg/mL PnIj Inject the contents of one pen (75 mg) under the skin every fourteen (14) days. 2 mL 11   ??? amLODIPine (NORVASC) 5 MG tablet Take 1 tablet (5 mg total) by mouth daily. 30 tablet 0   ??? baclofen (LIORESAL) 10 MG tablet Take 10 mg by mouth Three (3) times a day.     ??? cetirizine (ZYRTEC) 10 MG tablet Take 10 mg by mouth daily.     ??? clopidogreL (PLAVIX) 75 mg tablet Take 1 tablet (75 mg total) by mouth in the morning. 90 tablet 3   ??? ezetimibe (ZETIA) 10 mg tablet Take 1 tablet (10 mg total) by mouth daily. 90 tablet 3   ??? folic acid (FOLVITE) 1 MG tablet Take 1,000 mcg by mouth daily.     ??? HYDROcodone-acetaminophen (NORCO) 5-325 mg per tablet Take 1 tablet by mouth every four (4) hours as needed for pain. for up to 30 doses (Patient taking differently: Take 1 tablet by mouth every eight (8) hours as needed for pain.) 30 tablet 0   ??? isosorbide mononitrate (IMDUR) 30 MG 24 hr tablet Take 1 tablet (30 mg total) by mouth in the morning. 30 tablet 11   ??? losartan (COZAAR) 50 MG tablet Take 50 mg by mouth Two (2) times a day.     ??? metoprolol succinate (TOPROL-XL) 50 MG 24 hr tablet TAKE ONE TABLET BY MOUTH EVERY DAY 90 tablet 3   ??? nitroglycerin (NITROSTAT) 0.4 MG SL tablet Place 1 tablet (0.4 mg total) under the tongue every five (5) minutes as needed for chest pain. Maximum of 3 doses in 15 minutes. 25 tablet 3   ??? omeprazole (PRILOSEC) 20 MG capsule Take 20 mg by mouth daily.     ??? pregabalin (LYRICA) 100 MG capsule Take 100 mg by mouth 3 (three) times a day.     ??? ranolazine (RANEXA) 500 MG 12 hr tablet Take 1 tablet (500 mg total) by mouth Two (2) times a day. 180 tablet 3   ??? sulfaSALAzine (AZULFIDINE) 500 mg tablet Take 1,000 mg by mouth Three (3) times a day.      ??? XARELTO 20 mg tablet Take 20 mg by mouth daily with evening meal.       No current facility-administered medications for this visit.       Allergies   Allergen Reactions   ??? Dilaudid [Hydromorphone] Nausea And Vomiting   ??? Atorvastatin      myalgias   ??? Rosuvastatin      myalgias   ??? Zanaflex [Tizanidine]        Patient Active Problem List   Diagnosis   ??? Chest pain   ??? Fatigue   ??? Hypertension   ??? Mixed hyperlipidemia   ??? Shortness of breath   ??? Dizziness   ??? Lumbar hernia   ??? Essential hypertension   ??? Headache   ??? Left facial numbness   ??? Abdominal mass   ??? Chronic left-sided back pain   ??? Irregular heart beat   ??? SVT (supraventricular tachycardia) (CMS-HCC)   ??? Coronary artery disease involving native coronary artery of native heart with angina pectoris (CMS-HCC)   ??? Esophageal dysphagia   ??? Acute deep vein thrombosis (DVT) of lower extremity (CMS-HCC)   ??? History of GI  bleed   ??? Acute deep vein thrombosis (DVT) of proximal vein of left lower extremity (CMS-HCC)   ??? Hematoma   ??? History of DVT (deep vein thrombosis)   ??? S/P IVC filter       Reviewed and up to date in Epic.    Appropriateness of Therapy     Acute infections noted within Epic:  MDR Bacteria  Patient reported infection: None    Is medication and dose appropriate based on diagnosis and infection status? Yes    Prescription has been clinically reviewed: Yes      Baseline Quality of Life Assessment      How many days over the past month did your CAD  keep you from your normal activities? For example, brushing your teeth or getting up in the morning. Patient declined to answer    Financial Information     Medication Assistance provided: None Required  Anticipated copay of $47 (28 days) reviewed with patient. Verified delivery address.    Delivery Information     Scheduled delivery date: 07/07/21    Expected start date: 07/07/21    Medication will be delivered via UPS to the prescription address in [employer].  This shipment will not require a signature.      Explained the services we provide at [facility] and that each month we would call to set up refills.  Stressed importance of returning phone calls so that we could ensure they receive their medications in time each month.  Informed patient that we should be setting up refills 7-10 days prior to when they will run out of medication.  A pharmacist will reach out to perform a clinical assessment periodically.  Informed patient that a welcome packet, containing information about our pharmacy and other support services, a Notice of Privacy Practices, and a drug information handout will be sent.      The patient or caregiver noted above participated in the development of this care plan and knows that they can request review of or adjustments to the care plan at any time.      Patient or caregiver verbalized understanding of the above information as well as how to contact the pharmacy at [phone] option 4 with any questions/concerns.  The pharmacy is open Monday through Friday 8:30am-4:30pm.  A pharmacist is available 24/7 via pager to answer any clinical questions they may have.    Patient Specific Needs     - Does the patient have any physical, cognitive, or cultural barriers? No    - Does the patient have adequate living arrangements? (i.e. the ability to store and take their medication appropriately) Yes    - Did you identify any home environmental safety or security hazards? No    - Patient prefers to have medications discussed with  Patient     - Is the patient or caregiver able to read and understand education materials at a high school level or above? Yes    - Patient's primary language is  English     - Is the patient high risk? No    SOCIAL DETERMINANTS OF HEALTH     At the [facility], we have learned that life circumstances - like trouble affording food, housing, utilities, or transportation can affect the health of many of our patients.   That is why we wanted to ask: are you currently experiencing any life circumstances that are negatively impacting your health and/or quality of life? Patient declined to  answer    Social Determinants of Health     Food Insecurity: Not on file   Tobacco Use: Low Risk    ??? Smoking Tobacco Use: Never   ??? Smokeless Tobacco Use: Never   ??? Passive Exposure: Not on file   Transportation Needs: Not on file   Alcohol Use: Not on file   Housing/Utilities: Not on file   Substance Use: Not on file   Financial Resource Strain: Not on file   Physical Activity: Not on file   Health Literacy: Not on file   Stress: Not on file   Intimate Partner Violence: Not on file   Depression: Not on file   Social Connections: Not on file       Would you be willing to receive help with any of the needs that you have identified today? Not applicable       Camillo Flaming  [facility] Specialty Pharmacist

## 2021-07-06 MED FILL — EMPTY CONTAINER: 120 days supply | Qty: 1 | Fill #0

## 2021-07-10 MED ORDER — CLOPIDOGREL 75 MG TABLET
ORAL_TABLET | 1 refills | 0 days | Status: CP
Start: 2021-07-10 — End: ?

## 2021-07-27 NOTE — Unmapped (Signed)
[facility] Clinical Assessment & Refill Coordination Note    Nathaniel Page, [place]: 11/28/1950  Phone: [phone] (home)     All above HIPAA information was verified with patient's family member, wife.     Was a Nurse, learning disability used for this call? No    Specialty Medication(s):   General Specialty: Praluent     Current Outpatient Medications   Medication Sig Dispense Refill   ??? alirocumab (PRALUENT PEN) 75 mg/mL PnIj Inject the contents of one pen (75 mg) under the skin every fourteen (14) days. 2 mL 11   ??? amLODIPine (NORVASC) 5 MG tablet Take 1 tablet (5 mg total) by mouth daily. 30 tablet 0   ??? baclofen (LIORESAL) 10 MG tablet Take 10 mg by mouth Three (3) times a day.     ??? cetirizine (ZYRTEC) 10 MG tablet Take 10 mg by mouth daily.     ??? clopidogreL (PLAVIX) 75 mg tablet TAKE ONE TABLET BY MOUTH EVERY MORNING 90 tablet 1   ??? empty container (SHARPS-A-GATOR DISPOSAL SYSTEM) Misc Use as directed for sharps disposal 1 each 2   ??? ezetimibe (ZETIA) 10 mg tablet Take 1 tablet (10 mg total) by mouth daily. 90 tablet 3   ??? folic acid (FOLVITE) 1 MG tablet Take 1,000 mcg by mouth daily.     ??? HYDROcodone-acetaminophen (NORCO) 5-325 mg per tablet Take 1 tablet by mouth every four (4) hours as needed for pain. for up to 30 doses (Patient taking differently: Take 1 tablet by mouth every eight (8) hours as needed for pain.) 30 tablet 0   ??? isosorbide mononitrate (IMDUR) 30 MG 24 hr tablet Take 1 tablet (30 mg total) by mouth in the morning. 30 tablet 11   ??? losartan (COZAAR) 50 MG tablet Take 50 mg by mouth Two (2) times a day.     ??? metoprolol succinate (TOPROL-XL) 50 MG 24 hr tablet TAKE ONE TABLET BY MOUTH EVERY DAY 90 tablet 3   ??? nitroglycerin (NITROSTAT) 0.4 MG SL tablet Place 1 tablet (0.4 mg total) under the tongue every five (5) minutes as needed for chest pain. Maximum of 3 doses in 15 minutes. 25 tablet 3   ??? omeprazole (PRILOSEC) 20 MG capsule Take 20 mg by mouth daily.     ??? pregabalin (LYRICA) 100 MG capsule Take 100 mg by mouth 3 (three) times a day.     ??? ranolazine (RANEXA) 500 MG 12 hr tablet Take 1 tablet (500 mg total) by mouth Two (2) times a day. 180 tablet 3   ??? sulfaSALAzine (AZULFIDINE) 500 mg tablet Take 1,000 mg by mouth Three (3) times a day.      ??? XARELTO 20 mg tablet Take 20 mg by mouth daily with evening meal.       No current facility-administered medications for this visit.        Changes to medications: Ebrima reports no changes at this time.    Allergies   Allergen Reactions   ??? Dilaudid [Hydromorphone] Nausea And Vomiting   ??? Atorvastatin      myalgias   ??? Rosuvastatin      myalgias   ??? Zanaflex [Tizanidine]        Changes to allergies: No    SPECIALTY MEDICATION ADHERENCE     Praluent 75 mg/ml: 8 days of medicine on hand       Medication Adherence    Patient reported X missed doses in the last month: 0  Specialty Medication: Praluent 75mg /mL  Informant: spouse          Specialty medication(s) dose(s) confirmed: Regimen is correct and unchanged.     Are there any concerns with adherence? No    Adherence counseling provided? Not needed    CLINICAL MANAGEMENT AND INTERVENTION      Clinical Benefit Assessment:    Do you feel the medicine is effective or helping your condition? Yes    Clinical Benefit counseling provided? Not needed    Adverse Effects Assessment:    Are you experiencing any side effects? No    Are you experiencing difficulty administering your medicine? No    Quality of Life Assessment:     How many days over the past month did your CAD  keep you from your normal activities? For example, brushing your teeth or getting up in the morning. Patient declined to answer    Have you discussed this with your provider? Not needed    Acute Infection Status:    Acute infections noted within Epic:  MDR Bacteria  Patient reported infection: None    Therapy Appropriateness:    Is therapy appropriate and patient progressing towards therapeutic goals? Yes, therapy is appropriate and should be continued    DISEASE/MEDICATION-SPECIFIC INFORMATION      For patients on injectable medications: Patient currently has 0 doses left.  Next injection is scheduled for 08/04/21.    PATIENT SPECIFIC NEEDS     - Does the patient have any physical, cognitive, or cultural barriers? No    - Is the patient high risk? No    - Does the patient require a Care Management Plan? No     SOCIAL DETERMINANTS OF HEALTH     At the [facility], we have learned that life circumstances - like trouble affording food, housing, utilities, or transportation can affect the health of many of our patients.   That is why we wanted to ask: are you currently experiencing any life circumstances that are negatively impacting your health and/or quality of life? Patient declined to answer    Social Determinants of Health     Food Insecurity: Not on file   Tobacco Use: Low Risk    ??? Smoking Tobacco Use: Never   ??? Smokeless Tobacco Use: Never   ??? Passive Exposure: Not on file   Transportation Needs: Not on file   Alcohol Use: Not on file   Housing/Utilities: Not on file   Substance Use: Not on file   Financial Resource Strain: Not on file   Physical Activity: Not on file   Health Literacy: Not on file   Stress: Not on file   Intimate Partner Violence: Not on file   Depression: Not on file   Social Connections: Not on file       Would you be willing to receive help with any of the needs that you have identified today? Not applicable       SHIPPING     Specialty Medication(s) to be Shipped:   General Specialty: Praluent    Other medication(s) to be shipped: No additional medications requested for fill at this time     Changes to insurance: No    Delivery Scheduled: Yes, Expected medication delivery date: 07/30/21.     Medication will be delivered via UPS to the confirmed prescription address in [facility]    The patient will receive a drug information handout for each medication shipped and additional FDA Medication Guides as required.  Verified that patient has previously received a Conservation officer, historic buildings and a Surveyor, mining.    The patient or caregiver noted above participated in the development of this care plan and knows that they can request review of or adjustments to the care plan at any time.      All of the patient's questions and concerns have been addressed.    Camillo Flaming   [facility] Specialty Pharmacist

## 2021-07-29 ENCOUNTER — Ambulatory Visit
Admit: 2021-07-29 | Discharge: 2021-07-30 | Payer: MEDICARE | Attending: Orthopaedic Surgery | Primary: Orthopaedic Surgery

## 2021-07-29 ENCOUNTER — Ambulatory Visit: Admit: 2021-07-29 | Discharge: 2021-07-30 | Payer: MEDICARE

## 2021-07-29 DIAGNOSIS — M25561 Pain in right knee: Principal | ICD-10-CM

## 2021-07-29 MED ADMIN — ropivacaine (NAROPIN) 5 mg/mL (0.5 %) injection 2 mL: 2 mL | @ 13:00:00 | Stop: 2021-07-29

## 2021-07-29 MED ADMIN — triamcinolone acetonide (KENALOG-40) injection 40 mg: 40 mg | INTRA_ARTICULAR | @ 13:00:00 | Stop: 2021-07-29

## 2021-07-29 MED FILL — PRALUENT PEN 75 MG/ML SUBCUTANEOUS PEN INJECTOR: SUBCUTANEOUS | 28 days supply | Qty: 2 | Fill #1

## 2021-07-29 NOTE — Unmapped (Signed)
Nathaniel Page is seen in consultation at the request of Pieter Partridge. for evaluation of:   Chief Complaint   Patient presents with   ??? Right Knee - Pain       HPI: Patient 71 y/o male presents with constant right knee pain. His pain onset gradually beginning several years ago but worsening over the past few months ago without inciting injury or previous surgery to the right knee. He rates his pain 8/10 and describes I as excruciating with associated popping, tenderness, and feeling of giving away. Walking worsens his pain whereas rest helps to alleviate his pain. States he did receive injections awhile ago however has not had one recently. 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 No further questions or concerns.     Past Medical History:   Diagnosis Date   ??? Anemia     not recently   ??? Angina pectoris (CMS-HCC)     DR Cherylann Ratel last seen may 2016   ??? Arthritis     shoulders bilaterally   ??? CAD (coronary artery disease)    ??? CHF (congestive heart failure) (CMS-HCC)    ??? Colon polyp    ??? Diverticulitis of colon    ??? Fibromyalgia    ??? GERD (gastroesophageal reflux disease)    ??? Hard to intubate     perforated mucousa in back of throat 1992   ??? Hernia    ??? Hiatal hernia     repaired   ??? Hyperlipidemia    ??? Hypertension    ??? Kidney stones    ??? Lupus (CMS-HCC)    ??? Pneumonia     last year 2015   ??? Rectal bleeding    ??? Ulcerative colitis (CMS-HCC)     recently diagnosed       Past Surgical History:   Procedure Laterality Date   ??? ABDOMINAL SURGERY     ??? CARDIAC CATHETERIZATION     ??? CARDIAC CATHETERIZATION  01/2013    Nonsignificant CAD, with 25-30% disease of the left anterior descending and circumflex.   ??? CHOLECYSTECTOMY     ??? ESOPHAGUS SURGERY      repair, s/p punture during another surgery   ??? GASTROSTOMY W/ FEEDING TUBE  1992   ??? HIATAL HERNIA REPAIR     ??? INGUINAL HERNIA REPAIR     ??? KIDNEY STONE SURGERY     ??? PR CATH PLACE/CORON ANGIO, IMG SUPER/INTERP,W LEFT HEART VENTRICULOGRAPHY N/A 10/14/2020 Procedure: Left Heart Catheterization W Intervention;  Surgeon: Oleta Mouse, MD;  Location: [facility] CATH;  Service: Cardiology   ??? PR INS INTRVAS VC FILTR W/WO VAS ACS VSL SELXN RS&I Right 01/27/2019    Procedure: INS OF IVC FILTER, ENDOVASC APPROACH INC VASCULAR ACC, VESSEL SELECTION, AND RADIOLOGICAL SUPERVISION AND INTERPRETATION, INTRAPROCEDURAL ROADMAPPING, AND IMG GUIDANCE ([place] AND FLUORO), WHEN PERFORMED;  Surgeon: Trey Sailors, MD;  Location: [facility];  Service: Vascular   ??? PR PRQ TRLUML CORONARY STENT W/ANGIO ONE ART/BRNCH N/A 10/14/2020    Procedure: Percutaneous Coronary Intervention;  Surgeon: Job Founds, MD;  Location: [facility] CATH;  Service: Cardiology   ??? PR REPAIR ING HERNIA,5+Y/O,REDUCIBL Right 12/27/2014    Procedure: open repair of lumbar hernia--right--@ 1:00;  Surgeon: Bea Laura, MD;  Location: [facility];  Service: General Surgery   ??? TONSILLECTOMY     ??? UMBILICAL HERNIA REPAIR           Current Outpatient Medications:   ???  alirocumab (PRALUENT PEN) 75 mg/mL PnIj, Inject the contents of one pen (75 mg) under the skin every fourteen (14) days., Disp: 2 mL, Rfl: 11  ???  amLODIPine (NORVASC) 5 MG tablet, Take 1 tablet (5 mg total) by mouth daily., Disp: 30 tablet, Rfl: 0  ???  baclofen (LIORESAL) 10 MG tablet, Take 10 mg by mouth Three (3) times a day., Disp: , Rfl:   ???  cetirizine (ZYRTEC) 10 MG tablet, Take 10 mg by mouth daily., Disp: , Rfl:   ???  clopidogreL (PLAVIX) 75 mg tablet, TAKE ONE TABLET BY MOUTH EVERY MORNING, Disp: 90 tablet, Rfl: 1  ???  empty container (SHARPS-A-GATOR DISPOSAL SYSTEM) Misc, Use as directed for sharps disposal, Disp: 1 each, Rfl: 2  ???  ezetimibe (ZETIA) 10 mg tablet, Take 1 tablet (10 mg total) by mouth daily., Disp: 90 tablet, Rfl: 3  ???  folic acid (FOLVITE) 1 MG tablet, Take 1,000 mcg by mouth daily., Disp: , Rfl:   ???  HYDROcodone-acetaminophen (NORCO) 5-325 mg per tablet, Take 1 tablet by mouth every four (4) hours as needed for pain. for up to 30 doses (Patient taking differently: Take 1 tablet by mouth every eight (8) hours as needed for pain.), Disp: 30 tablet, Rfl: 0  ???  isosorbide mononitrate (IMDUR) 30 MG 24 hr tablet, Take 1 tablet (30 mg total) by mouth in the morning., Disp: 30 tablet, Rfl: 11  ???  losartan (COZAAR) 50 MG tablet, Take 50 mg by mouth Two (2) times a day., Disp: , Rfl:   ???  metoprolol succinate (TOPROL-XL) 50 MG 24 hr tablet, TAKE ONE TABLET BY MOUTH EVERY DAY, Disp: 90 tablet, Rfl: 3  ???  nitroglycerin (NITROSTAT) 0.4 MG SL tablet, Place 1 tablet (0.4 mg total) under the tongue every five (5) minutes as needed for chest pain. Maximum of 3 doses in 15 minutes., Disp: 25 tablet, Rfl: 3  ???  omeprazole (PRILOSEC) 20 MG capsule, Take 20 mg by mouth daily., Disp: , Rfl:   ???  pregabalin (LYRICA) 100 MG capsule, Take 100 mg by mouth 3 (three) times a day., Disp: , Rfl:   ???  ranolazine (RANEXA) 500 MG 12 hr tablet, Take 1 tablet (500 mg total) by mouth Two (2) times a day., Disp: 180 tablet, Rfl: 3  ???  sulfaSALAzine (AZULFIDINE) 500 mg tablet, Take 1,000 mg by mouth Three (3) times a day. , Disp: , Rfl:   ???  XARELTO 20 mg tablet, Take 20 mg by mouth daily with evening meal., Disp: , Rfl:     Allergies   Allergen Reactions   ??? Dilaudid [Hydromorphone] Nausea And Vomiting   ??? Atorvastatin      myalgias   ??? Rosuvastatin      myalgias   ??? Zanaflex [Tizanidine]        Family History   Problem Relation Age of Onset   ??? Heart disease Mother    ??? Heart attack Mother    ??? Heart failure Father    ??? Heart disease Sister        Social History     Socioeconomic History   ??? Marital status: Married     Spouse name: None   ??? Number of children: None   ??? Years of education: None   ??? Highest education level: None   Tobacco Use   ??? Smoking status: Never   ??? Smokeless tobacco: Never   Vaping Use   ??? Vaping Use: Never used  Substance and Sexual Activity   ??? Alcohol use: No     Alcohol/week: 0.0 standard drinks   ??? Drug use: No   ??? Sexual activity: Not Currently       I have reviewed past medical, surgical, social and family history, medications and allergies as documented in the EMR.      Review of Systems:  A comprehensive 12+ review of systems was negative unless otherwise stated in the HPI.     Physical Exam  Vitals:    07/29/21 0917   BP: 124/75   Pulse: 78     Vitals:    07/29/21 0917   Weight: 94.8 kg (209 lb)     Body mass index is 26.83 kg/m??.     Constitutional: In no acute distress  Respiratory: No labored breathing and no audible wheezing     Right Knee Exam:   Ambulates with an antalgic gait  Back non tender to palpation   Hip with pain free ROM  Skin intact  No swelling, ecchymosis, or erythema  Tender to palpation medial and lateral joint lines   ROM: 10-100  5/5 Hamsting and quad strength  Stable to anterior, posterior, varus and valgus stress  Lower extremity motor and neuro exam:  gastroc-soleus complex/tibialis anterior/ and extensor hallucis longus motor intact; sensation intact to light touch saphenous/sural/ superficial peroneal/ deep peroneal/ and tibial nerve distribution, warm and well perfused  DP palpable    XR right knee 3 views AP, lateral and bilateral merchant: shows No fracture, dislocation, or masses noted. Narrowing of medial compartment joint space to 4 mm.     Lg Joint Inj: R knee on 07/29/2021 9:00 AM  Indications: pain  Details: 22 G needle, anterolateral approach  Medications: 40 mg triamcinolone acetonide 40 mg/mL; 2 mL ropivacaine 5 mg/mL (0.5 %)  Procedure, treatment alternatives, risks and benefits explained, specific risks discussed. Consent was given by the patient. Immediately prior to procedure a time out was called to verify the correct patient, procedure, equipment, support staff and site/side marked as required. Patient was prepped and draped in the usual sterile fashion.     Medical Care Team Attestation: All ProcDoc orders were read back and verbally confirmed with the procedure provider, including but not limited to patient name, medication name, dose, and route, before any actions were taken.  Provider Attestation: The information documented by members of my medical care team was reviewed and verified for accuracy by me.              A/P: Patient 71 y/o male with right knee pain, mild DJD versus concern for meniscus tear. Due to the patient's right knee pain, we believed an injection would provide relief, which was completed today. Other alternatives were discussed, including daily exercise, proper nutrition, and various analgesics. List of physician directed home exercises provided today. Follow up in 6 weeks for reassessment with consideration of ordering an MRI upon return if not improved.       I attest that I, Jonetta Speak, personally documented this note while acting as scribe for Annitta Jersey, MD.      Jonetta Speak, Scribe.  07/29/2021     The documentation recorded by the scribe accurately reflects the service I personally performed and the decisions made by me.    Annitta Jersey, MD

## 2021-07-29 NOTE — Unmapped (Signed)
Patient 71 y/o male with right knee pain, mild DJD versus concern for meniscus tear. Due to the patient's right knee pain, we believed an injection would provide relief, which was completed today. Other alternatives were discussed, including daily exercise, proper nutrition, and various analgesics. List of physician directed home exercises provided today. Follow up in 6 weeks for reassessment with consideration of ordering an MRI upon return if not improved.

## 2021-08-21 NOTE — Unmapped (Signed)
[facility] Refill Coordination Note    Specialty Medication(s) to be Shipped:   General Specialty: Praluent    Other medication(s) to be shipped: No additional medications requested for fill at this time     Nathaniel Page, DOB: 08-13-1950  Phone: [phone] (home)       All above HIPAA information was verified with patient's family member, Wife.     Was a Nurse, learning disability used for this call? No    Completed refill call assessment today to schedule patient's medication shipment from the [facility] [phone]).  All relevant notes have been reviewed.     Specialty medication(s) and dose(s) confirmed: Regimen is correct and unchanged.   Changes to medications: Keysean reports no changes at this time.  Changes to insurance: No  New side effects reported not previously addressed with a pharmacist or physician: None reported  Questions for the pharmacist: No    Confirmed patient received a Conservation officer, historic buildings and a Surveyor, mining with first shipment. The patient will receive a drug information handout for each medication shipped and additional FDA Medication Guides as required.       DISEASE/MEDICATION-SPECIFIC INFORMATION        For patients on injectable medications: Patient currently has 0 doses left.  Next injection is scheduled for 09/01/21.    SPECIALTY MEDICATION ADHERENCE     Medication Adherence    Patient reported X missed doses in the last month: 0  Specialty Medication: Praluent 75mg /ml  Patient is on additional specialty medications: No  Patient is on more than two specialty medications: No              Were doses missed due to medication being on hold? No    Praluent 75 mg/ml: 0 days of medicine on hand       REFERRAL TO PHARMACIST     Referral to the pharmacist: Not needed      [facility]     Shipping address confirmed in Epic.     Delivery Scheduled: Yes, Expected medication delivery date: 08/27/21.     Medication will be delivered via UPS to the prescription address in Epic WAM.    Nancy Nordmann [facility] Specialty Technician

## 2021-08-25 ENCOUNTER — Ambulatory Visit
Admit: 2021-08-25 | Discharge: 2021-08-26 | Payer: MEDICARE | Attending: Pharmacist Clinician (PhC)/ Clinical Pharmacy Specialist | Primary: Pharmacist Clinician (PhC)/ Clinical Pharmacy Specialist

## 2021-08-25 DIAGNOSIS — E782 Mixed hyperlipidemia: Principal | ICD-10-CM

## 2021-08-25 NOTE — Unmapped (Signed)
Ambulatory Care Team Clinical Pharmacist Consult  Lipids Management    HPI:   Nathaniel Page is a 71 y.o. male who presents to clinic today for lipid management.    Secondary prevention of ASCVD event  -- PCI to RCA    Current cholesterol-lowering regimen: Praluent 75mg  every 14 days    Primary Physician: Pieter Partridge, MD  Referring Cardiologist: Marcelline Deist, MD  Primary supervising MD for CPP:  ERIC Cherylann Ratel, MD  The supervising physician in office today is: Lorenda Ishihara, MD    Subjective / Objective:     Diet:  He has changed his eating habits and eating much healthier.  Cut out sodas and sugary drinks.  He has lost about 14 pounds in the past couple of months.  Encouraged to continue.    Exercise:  Stays active .  Recommend that patient Continue exercise as tolerated.    Tobacco and Alcohol:  Patient  reports no history of alcohol use.  Patient  reports that he has never smoked. He has never used smokeless tobacco.    Vitals:  Wt Readings from Last 3 Encounters:   08/25/21 95.7 kg (211 lb)   07/29/21 94.8 kg (209 lb)   06/10/21 (!) 102.1 kg (225 lb)     BP Readings from Last 3 Encounters:   07/29/21 124/75   02/14/21 128/65   11/27/20 120/80     Allergies:  Allergies   Allergen Reactions    Dilaudid [Hydromorphone] Nausea And Vomiting    Atorvastatin      myalgias    Rosuvastatin      myalgias    Zanaflex [Tizanidine]       Medication History:  Prior to Admission medications    Medication Dose, Route, Frequency   alirocumab (PRALUENT PEN) 75 mg/mL PnIj Inject the contents of one pen (75 mg) under the skin every fourteen (14) days.   amLODIPine (NORVASC) 5 MG tablet 5 mg, Oral, Daily (standard)   baclofen (LIORESAL) 10 MG tablet 10 mg, Oral, 3 times a day (standard)   cetirizine (ZYRTEC) 10 MG tablet 10 mg, Oral, Daily (standard)   clopidogreL (PLAVIX) 75 mg tablet TAKE ONE TABLET BY MOUTH EVERY MORNING   empty container (SHARPS-A-GATOR DISPOSAL SYSTEM) Misc Use as directed for sharps disposal   folic acid (FOLVITE) 1 MG tablet 1,000 mcg, Oral, Daily (standard)   HYDROcodone-acetaminophen (NORCO) 5-325 mg per tablet 1 tablet, Oral, Every 4 hours PRN  Patient taking differently: Take 1 tablet by mouth every eight (8) hours as needed for pain.   losartan (COZAAR) 50 MG tablet 50 mg, Oral, 2 times a day (standard)   metoprolol succinate (TOPROL-XL) 50 MG 24 hr tablet TAKE ONE TABLET BY MOUTH EVERY DAY   nitroglycerin (NITROSTAT) 0.4 MG SL tablet 0.4 mg, Sublingual, Every 5 min PRN, Maximum of 3 doses in 15 minutes.   omeprazole (PRILOSEC) 20 MG capsule 20 mg, Oral, Daily (standard)   pregabalin (LYRICA) 100 MG capsule 100 mg, 3 times daily (RT)   ranolazine (RANEXA) 500 MG 12 hr tablet 500 mg, Oral, 2 times a day (standard)   sulfaSALAzine (AZULFIDINE) 500 mg tablet 1,000 mg, Oral, 3 times a day (standard)   XARELTO 20 mg tablet 20 mg, Oral, Daily      Lipid results:  Lab Results   Component Value Date    CHOL 102 08/25/2021    CHOL 172 06/10/2021    CHOL 185 10/14/2020     Lab Results  Component Value Date    LDL 37 08/25/2021    LDL 102 06/10/2021    LDL 96 10/14/2020     Lab Results   Component Value Date    HDL 37 08/25/2021    HDL 31 06/10/2021    HDL 29 (L) 10/14/2020     Lab Results   Component Value Date    TRIG 211 08/25/2021    TRIG 295 06/10/2021    TRIG 299 (H) 10/14/2020     Lab Results   Component Value Date    NONHDL 65 08/25/2021    NONHDL 141 06/10/2021    NONHDL 156 (H) 10/14/2020       Assessment:   Personal and family history have been reviewed.  Patient's medications have been reviewed.    The patient has no complaints of muscle aches, pain and weakness.    Lipid panel reviewed face-to-face with patient.  Patient educated on cholesterol and components.  HDL-C is below goal  Triglycerides are above goal  LDL-C is at goal  Non-HDL-C is at goal    Plan:   Plan of care per established protocol:  History of cholesterol-lowering medications: atorvastatin, rosuvastatin = myalgias.  Zetia = ineffective for lowering LDL to goal  Patient's LDL and non-HDL are much improved since starting the Praluent and are now within optimal goals.  He is very pleased with results today.  Will continue Praluent.  Positive reinforcement given for healthy lifestyle changes and weight loss.  Follow-up appointment recommended for 6 to 12 months due to lipids at goal levels or maximum tolerated therapy      Total time spent with patient and coordinating care:  30 minutes    Rosalin Hawking PharmD, Patsy Baltimore, CLS, CPP  Clinical Pharmacist Practitioner  08/25/2021  10:13 AM

## 2021-08-27 MED FILL — PRALUENT PEN 75 MG/ML SUBCUTANEOUS PEN INJECTOR: SUBCUTANEOUS | 28 days supply | Qty: 2 | Fill #2

## 2021-09-28 MED ORDER — METOPROLOL SUCCINATE ER 50 MG TABLET,EXTENDED RELEASE 24 HR
ORAL_TABLET | 1 refills | 0 days | Status: CP
Start: 2021-09-28 — End: ?

## 2021-09-28 NOTE — Unmapped (Signed)
The [facility] has made a second and final attempt to reach this patient to refill the following medication:Praluent.      We have left voicemails on the following phone numbers: [phone] and have sent a MyChart message.    Dates contacted: 09/17/21, 09/28/21  Last scheduled delivery: 08/27/21    The patient may be at risk of non-compliance with this medication. The patient should call the [facility] at [phone]  Option 4, then Option 2 (all other specialty patients) to refill medication.    Nathaniel Page   [facility]

## 2021-09-29 NOTE — Unmapped (Signed)
[facility] Refill Coordination Note    Specialty Medication(s) to be Shipped:   General Specialty: Praluent    Other medication(s) to be shipped: No additional medications requested for fill at this time     Nathaniel Page, DOB: 14-Jul-1950  Phone: [phone] (home)       All above HIPAA information was verified with patient's family member, Wife.     Was a Nurse, learning disability used for this call? No    Completed refill call assessment today to schedule patient's medication shipment from the [facility] [phone]).  All relevant notes have been reviewed.     Specialty medication(s) and dose(s) confirmed: Regimen is correct and unchanged.   Changes to medications: Nieves reports no changes at this time.  Changes to insurance: No  New side effects reported not previously addressed with a pharmacist or physician: None reported  Questions for the pharmacist: No    Confirmed patient received a Conservation officer, historic buildings and a Surveyor, mining with first shipment. The patient will receive a drug information handout for each medication shipped and additional FDA Medication Guides as required.       DISEASE/MEDICATION-SPECIFIC INFORMATION        For patients on injectable medications: Patient currently has 0 doses left.  Next injection is scheduled for 10/02/21.    SPECIALTY MEDICATION ADHERENCE     Medication Adherence    Patient reported X missed doses in the last month: 0  Specialty Medication: Praluent  Patient is on additional specialty medications: No  Any gaps in refill history greater than 2 weeks in the last 3 months: no  Demonstrates understanding of importance of adherence: yes  Informant: spouse  Reliability of informant: reliable  Confirmed plan for next specialty medication refill: delivery by pharmacy  Refills needed for supportive medications: not needed              Were doses missed due to medication being on hold? No    Praluent 75 mg/ml: 0 days of medicine on hand REFERRAL TO PHARMACIST     Referral to the pharmacist: Not needed      [facility]     Shipping address confirmed in Epic.     Delivery Scheduled: Yes, Expected medication delivery date: 10/01/21.     Medication will be delivered via UPS to the prescription address in Epic WAM.    Syona Wroblewski D Marten Iles   [facility] Specialty Technician

## 2021-09-30 MED FILL — PRALUENT PEN 75 MG/ML SUBCUTANEOUS PEN INJECTOR: SUBCUTANEOUS | 28 days supply | Qty: 2 | Fill #3

## 2021-10-02 DIAGNOSIS — T1592XA Foreign body on external eye, part unspecified, left eye, initial encounter: Principal | ICD-10-CM

## 2021-10-02 MED ORDER — ERYTHROMYCIN 5 MG/GRAM (0.5 %) EYE OINTMENT
Freq: Three times a day (TID) | OPHTHALMIC | 0 refills | 10 days | Status: CP
Start: 2021-10-02 — End: 2021-10-12

## 2021-10-03 ENCOUNTER — Ambulatory Visit: Admit: 2021-10-03 | Discharge: 2021-10-03 | Disposition: A | Discharge status: Home / Self Care | Payer: MEDICARE

## 2021-10-03 MED ADMIN — erythromycin (ROMYCIN) 5 mg/gram (0.5 %) ophthalmic ointment 1 application.: 1 | OPHTHALMIC | @ 02:00:00 | Stop: 2021-10-02

## 2021-10-03 MED ADMIN — tetracaine HCl (PF) (ALTACAINE) 0.5 % 1 drop: 1 [drp] | OPHTHALMIC | @ 01:00:00 | Stop: 2021-10-02

## 2021-10-03 MED ADMIN — fluorescein ophthalmic strip 1 strip: 1 | OPHTHALMIC | @ 01:00:00 | Stop: 2021-10-02

## 2021-10-03 NOTE — Unmapped (Signed)
Pt seen for foreign body in eye; VSS, NAD, A&Ox4.Pt discharged to home with 1 new rx and referral to Dr Gershon Crane. Medication and follow-up education completed, pt verbalized understanding. Pt left amb with SO, gait steady.

## 2021-10-03 NOTE — Unmapped (Signed)
ED Procedure Note    Foreign Body    Date/Time: 10/02/2021 9:33 PM  Performed by: Kayen Grabel Yvonne Kendall, PA  Authorized by: Dorthy Cooler, DO     Consent:     Consent obtained:  Verbal    Consent given by:  Patient    Risks, benefits, and alternatives were discussed: yes      Risks discussed:  Incomplete removal, bleeding, nerve damage, infection, pain, poor cosmetic result and worsening of condition  Universal protocol:     Procedure explained and questions answered to patient or proxy's satisfaction: yes      Relevant documents present and verified: no      Test results available: no      Imaging studies available: no      Required blood products, implants, devices, and special equipment available: no      Site/side marked: no      Immediately prior to procedure, a time out was called: no      Patient identity confirmed:  Arm band  Location:     Location:  Face (left eye)  Pre-procedure details:     Imaging:  None    Neurovascular status: intact    Anesthesia:     Anesthesia method:  Topical application    Topical anesthetic:  Tetracaine gel  Procedure details:     Removal mechanism: Qtip.    Foreign bodies recovered:  1    Description:  Dirt/wood    Intact foreign body removal: yes    Post-procedure details:     Neurovascular status: intact      Confirmation:  No additional foreign bodies on visualization    Skin closure:  None    Procedure completion:  Tolerated well, no immediate complications

## 2021-10-03 NOTE — Unmapped (Signed)
Emergency Department Provider Note  Room: MT03/MT03    Medical Decision Making     Duel Conrad is a 71 y.o. male who presents emergency department with visible foreign body to left eye.  Will anesthetize eye and attempt removal.  Do not feel further emergent work-up indicated at this time.  Patient feels comfortable's plan.    DDX: Foreign body left eye, corneal abrasion left eye    Progress Notes     21:30 FB removed from left eye with Q-tip, 1 attempt.  Small area of fluorescein uptake after removal of foreign body.  Will apply erythromycin ointment.  Patient has an optometrist that he will follow-up with.  We will also give Dr. Jonathon Jordan phone number.  Strict return precautions given.  Patient feels comfortable with this  plan.       ___    Discussion of Management with other Physicians, QHP or Appropriate Source: None  Independent Interpretation of Studies: None  External Records Reviewed: None  Escalation of Care, Consideration of Admission/Observation/Transfer: However, patient was determined to be appropriate for outpatient management. See progress note for additional detail.    Social determinants that significantly affected care: None   Prescription Medications Considered But Not Prescribed: None  Diagnostic Tests Considered But Not Performed: None    Disposition     Clinical Impression:   Final diagnoses:   Foreign body of left eye, initial encounter (Primary)       Final Disposition: Discharge      History     Chief Complaint:  Eye Redness       HPI:  Kolter Reaver is a 71 y.o. male who presents emergency department with visible foreign body to left eye.  Patient states that he was outside mowing the lawn when he felt the foreign body fly into his eye.  They attempted removal of the foreign body at home including with a Q-tip but were unable to do so so they came to the emergency department for further evaluation.  Injury occurred this evening.  Last tetanus less than 5 years.  Patient wears glasses.  Patient does not wear contacts.  Patient denies visual changes.    Outside Historian(s): None    MEDICATIONS:   Discharge Medication List as of 10/02/2021  9:36 PM        START taking these medications    Details   erythromycin (ROMYCIN) 5 mg/gram (0.5 %) ophthalmic ointment Administer into the left eye Three (3) times a day for 10 days., Starting Fri 10/02/2021, Until Mon 10/12/2021, Normal           CONTINUE these medications which have NOT CHANGED    Details   alirocumab (PRALUENT PEN) 75 mg/mL PnIj Inject the contents of one pen (75 mg) under the skin every fourteen (14) days., Starting Thu 06/25/2021, Normal      amLODIPine (NORVASC) 5 MG tablet Take 1 tablet (5 mg total) by mouth daily., Starting 04/21/2015, Until Discontinued, Print      baclofen (LIORESAL) 10 MG tablet Take 10 mg by mouth Three (3) times a day., Historical Med      cetirizine (ZYRTEC) 10 MG tablet Take 10 mg by mouth daily., Historical Med      clopidogreL (PLAVIX) 75 mg tablet TAKE ONE TABLET BY MOUTH EVERY MORNING, Normal      empty container (SHARPS-A-GATOR DISPOSAL SYSTEM) Misc Use as directed for sharps disposal, Starting Thu 07/02/2021, Normal      folic acid (FOLVITE) 1 MG tablet  Take 1,000 mcg by mouth daily., Starting Wed 12/13/2018, Historical Med      HYDROcodone-acetaminophen (NORCO) 5-325 mg per tablet Take 1 tablet by mouth every four (4) hours as needed for pain. for up to 30 doses, Starting 12/28/2014, Until Discontinued, Print      losartan (COZAAR) 50 MG tablet Take 50 mg by mouth Two (2) times a day., Starting Sat 09/20/2020, Historical Med      metoprolol succinate (TOPROL-XL) 50 MG 24 hr tablet TAKE ONE TABLET BY MOUTH EVERY DAY, Normal      nitroglycerin (NITROSTAT) 0.4 MG SL tablet Place 1 tablet (0.4 mg total) under the tongue every five (5) minutes as needed for chest pain. Maximum of 3 doses in 15 minutes., Starting Thu 11/13/2020, Until Fri 11/13/2021 at 2359, Normal      omeprazole (PRILOSEC) 20 MG capsule Take 20 mg by mouth daily., Historical Med      pregabalin (LYRICA) 100 MG capsule Take 100 mg by mouth 3 (three) times a day., Historical Med      ranolazine (RANEXA) 500 MG 12 hr tablet Take 1 tablet (500 mg total) by mouth Two (2) times a day., Starting Tue 06/09/2021, Normal      sulfaSALAzine (AZULFIDINE) 500 mg tablet Take 1,000 mg by mouth Three (3) times a day. , Historical Med      XARELTO 20 mg tablet Take 20 mg by mouth daily with evening meal., Starting Fri 09/19/2020, Historical Med             ALLERGIES:   Allergies   Allergen Reactions    Dilaudid [Hydromorphone] Nausea And Vomiting    Atorvastatin      myalgias    Rosuvastatin      myalgias    Zanaflex [Tizanidine]        PAST MEDICAL HISTORY:    Past Medical History:   Diagnosis Date    Anemia     not recently    Angina pectoris (CMS-HCC)     DR Cherylann Ratel last seen may 2016    Arthritis     shoulders bilaterally    CAD (coronary artery disease)     CHF (congestive heart failure) (CMS-HCC)     Colon polyp     Diverticulitis of colon     Fibromyalgia     GERD (gastroesophageal reflux disease)     Hard to intubate     perforated mucousa in back of throat 1992    Hernia     Hiatal hernia     repaired    Hyperlipidemia     Hypertension     Kidney stones     Lupus (CMS-HCC)     Pneumonia     last year 2015    Rectal bleeding     Ulcerative colitis (CMS-HCC)     recently diagnosed       PAST SURGICAL HISTORY:   Past Surgical History:   Procedure Laterality Date    ABDOMINAL SURGERY      CARDIAC CATHETERIZATION      CARDIAC CATHETERIZATION  01/2013    Nonsignificant CAD, with 25-30% disease of the left anterior descending and circumflex.    CHOLECYSTECTOMY      ESOPHAGUS SURGERY      repair, s/p punture during another surgery    GASTROSTOMY W/ FEEDING TUBE  1992    HIATAL HERNIA REPAIR      INGUINAL HERNIA REPAIR      KIDNEY STONE SURGERY      PR  CATH PLACE/CORON ANGIO, IMG SUPER/INTERP,W LEFT HEART VENTRICULOGRAPHY N/A 10/14/2020    Procedure: Left Heart Catheterization W Intervention;  Surgeon: Oleta Mouse, MD;  Location: [facility] CATH;  Service: Cardiology    PR INS INTRVAS VC FILTR W/WO VAS ACS VSL SELXN RS&I Right 01/27/2019    Procedure: INS OF IVC FILTER, ENDOVASC APPROACH INC VASCULAR ACC, VESSEL SELECTION, AND RADIOLOGICAL SUPERVISION AND INTERPRETATION, INTRAPROCEDURAL ROADMAPPING, AND IMG GUIDANCE ([place] AND FLUORO), WHEN PERFORMED;  Surgeon: Trey Sailors, MD;  Location: [facility];  Service: Vascular    PR PRQ TRLUML CORONARY Francine Graven Eather Colas ONE ART/BRNCH N/A 10/14/2020    Procedure: Percutaneous Coronary Intervention;  Surgeon: Job Founds, MD;  Location: [facility] CATH;  Service: Cardiology    PR REPAIR ING HERNIA,5+Y/O,REDUCIBL Right 12/27/2014    Procedure: open repair of lumbar hernia--right--@ 1:00;  Surgeon: Bea Laura, MD;  Location: [facility];  Service: General Surgery    TONSILLECTOMY      UMBILICAL HERNIA REPAIR         SOCIAL HISTORY:   Social History     Tobacco Use    Smoking status: Never    Smokeless tobacco: Never   Substance Use Topics    Alcohol use: No     Alcohol/week: 0.0 standard drinks       FAMILY HISTORY:    Family History   Problem Relation Age of Onset    Heart disease Mother     Heart attack Mother     Heart failure Father     Heart disease Sister          Physical Exam     Vitals:    10/02/21 2022   BP: 130/73   Pulse: 63   Resp: 18   Temp: 36.5 ??C (97.7 ??F)   SpO2: 97%       Reviewed vital signs and nursing note as charted by RN.    CONSTITUTIONAL: Alert and oriented and responds appropriately to questions. Well-appearing, NAD   HEAD: Normocephalic  EYES: PERRL; right Conjunctivae clear, left conjunctiva erythematous, + FB at 6 o'clock position of left ey, sclerae non-icteric.  + After foreign body removed, small area of fluorescein uptake at the 6 o'clock position of left eye.  Visual acuity noted.  ENT: normal nose; no rhinorrhea; moist mucous membranes  NECK:  no masses  SKIN: Normal color for age and race; warm; dry; good turgor; capillary refill < 2 seconds; no acute lesions noted   NEURO: speech clear, steady gait   PSYCH: The patient's mood and manner are appropriate. Grooming and personal hygiene are appropriate.      Results     Pertinent labs & imaging results that were available during my care of the patient were reviewed by me and considered in my medical decision making (see chart for details).    No results found for this visit on 10/02/21.  No orders to display     No results found for this visit on 10/02/21 (from the past 4464 hour(s)).              Jerriyah Louis Yvonne Kendall, [place]  10/02/21 2231

## 2021-10-03 NOTE — Unmapped (Signed)
While cutting grass, something flew into pts left eye, trying to rinse it out without relief.

## 2021-10-17 ENCOUNTER — Emergency Department: Admit: 2021-10-17 | Discharge: 2021-10-17 | Disposition: A | Discharge status: Home / Self Care | Payer: MEDICARE

## 2021-10-17 ENCOUNTER — Ambulatory Visit: Admit: 2021-10-17 | Discharge: 2021-10-17 | Disposition: A | Discharge status: Home / Self Care | Payer: MEDICARE

## 2021-10-17 DIAGNOSIS — M79604 Pain in right leg: Principal | ICD-10-CM

## 2021-10-17 NOTE — Unmapped (Cosign Needed)
Emergency Department Provider Note        ED Clinical Impression     Final diagnoses:   Right leg pain (Primary)       ED Assessment/Plan     Nathaniel Page is a 71 y.o. male who presents with right leg pain for 4 days. No recent immobilization or surgery to suggest DVT and inspection of the area does not reveal erythema or swelling, however will order [place] to rule out. No acute injury or fall and patient is able to walk without issue, patient is agreeable with deferring knee XR for now. Pulses and sensation are intact distally so do not suspect acute arterial occlusion. Ddx includes neuropathy, osteoarthritis, muscle strain, DVT, less likely claudication, septic arthritis, lumbar radiculopathy, primary trauma among other etiologies.      1345  Patient seen and evaluated. DVT study negative for thrombosis.  Results discussed.   Offered x-ray, patient declined.  Discussed symptoms may be related to meniscal tear as patient has had this previously and has had corticosteroid injection into the right knee about 3 months ago.  Plan for discharge home.  Recommended to continue all regular medications.  Take Tylenol for pain.  Referred to PCP/Ortho for follow up in 3-5 days if not improving.  We discussed signs and symptoms that would warrant reevaluation in the emergency department.  The patient verbalized understanding of and is in agreement with the care plan.    ___    Discussion of Management with other Physicians, QHP or Appropriate Source:   Independent Interpretation of Studies: DVT study reviewed by me. No thrombosis  External Records Reviewed:   Escalation of Care including OBS/Admission/Transfer was considered:     Social determinants that significantly affected care:   Prescription Medications Considered But Not Prescribed:   Diagnostic Tests Considered But Not Performed: considered right knee xray, patient declined      History     Chief Complaint   Patient presents with    Leg Pain     Nathaniel Page is a 71 y.o. male with a history of unprovoked DVT status post IVC filter placement and on Xarelto, lupus, fibromyalgia CAD s/p multiple PCI who presents with 4 days of pain in his right leg. Patient is concerned he may have a blood clot and states this feels similar to past episodes. Pain is located behind the right knee and extends up along his  medial thigh. He has not noticed any redness or swelling. The pain does prohibit movement somewhat however he has noted weakness in his right leg requiring him to hike his leg up manually when flexing his hip. This weakness started around the same time as the leg pain. He is able to walk without issue. He states he has some reduced sensory function in his feet secondary to neuropathy however no recent changes from baseline. Previous history of right meniscal tear. Endorses recent diarrheal illness. No fever, chills, chest pain, shortness of breath.      Leg Pain  Associated symptoms: no fever      Past Medical History:   Diagnosis Date    Anemia     not recently    Angina pectoris (CMS-HCC)     DR Cherylann Ratel last seen may 2016    Arthritis     shoulders bilaterally    CAD (coronary artery disease)     CHF (congestive heart failure) (CMS-HCC)     Colon polyp     Diverticulitis of colon  Fibromyalgia     GERD (gastroesophageal reflux disease)     Hard to intubate     perforated mucousa in back of throat 1992    Hernia     Hiatal hernia     repaired    Hyperlipidemia     Hypertension     Kidney stones     Lupus (CMS-HCC)     Pneumonia     last year 2015    Rectal bleeding     Ulcerative colitis (CMS-HCC)     recently diagnosed       Past Surgical History:   Procedure Laterality Date    ABDOMINAL SURGERY      CARDIAC CATHETERIZATION      CARDIAC CATHETERIZATION  01/2013    Nonsignificant CAD, with 25-30% disease of the left anterior descending and circumflex.    CHOLECYSTECTOMY      ESOPHAGUS SURGERY      repair, s/p punture during another surgery    GASTROSTOMY W/ FEEDING TUBE 1992    HIATAL HERNIA REPAIR      INGUINAL HERNIA REPAIR      KIDNEY STONE SURGERY      PR CATH PLACE/CORON ANGIO, IMG SUPER/INTERP,W LEFT HEART VENTRICULOGRAPHY N/A 10/14/2020    Procedure: Left Heart Catheterization W Intervention;  Surgeon: Oleta Mouse, MD;  Location: [facility] CATH;  Service: Cardiology    PR INS INTRVAS VC FILTR W/WO VAS ACS VSL SELXN RS&I Right 01/27/2019    Procedure: INS OF IVC FILTER, ENDOVASC APPROACH INC VASCULAR ACC, VESSEL SELECTION, AND RADIOLOGICAL SUPERVISION AND INTERPRETATION, INTRAPROCEDURAL ROADMAPPING, AND IMG GUIDANCE ([place] AND FLUORO), WHEN PERFORMED;  Surgeon: Trey Sailors, MD;  Location: [facility];  Service: Vascular    PR PRQ TRLUML CORONARY Francine Graven Eather Colas ONE ART/BRNCH N/A 10/14/2020    Procedure: Percutaneous Coronary Intervention;  Surgeon: Job Founds, MD;  Location[facility];  Service: Cardiology    PR REPAIR ING HERNIA,5+Y/O,REDUCIBL Right 12/27/2014    Procedure: open repair of lumbar hernia--right--@ 1:00;  Surgeon: Bea Laura, MD;  Location: [facility];  Service: General Surgery    TONSILLECTOMY      UMBILICAL HERNIA REPAIR         Family History   Problem Relation Age of Onset    Heart disease Mother     Heart attack Mother     Heart failure Father     Heart disease Sister        Social History     Socioeconomic History    Marital status: Married     Spouse name: None    Number of children: None    Years of education: None    Highest education level: None   Tobacco Use    Smoking status: Never    Smokeless tobacco: Never   Vaping Use    Vaping Use: Never used   Substance and Sexual Activity    Alcohol use: No     Alcohol/week: 0.0 standard drinks    Drug use: No    Sexual activity: Not Currently       Review of Systems   Constitutional:  Negative for diaphoresis and fever.   Respiratory:  Negative for cough and shortness of breath.    Cardiovascular:  Negative for chest pain.     Physical Exam     BP 150/93  - Pulse 60  - Temp 36.5 ??C (97.7 ??F) (Oral)  - Resp 17  - Ht 188 cm (6' 2)  -  Wt 91.6 kg (202 lb)  - SpO2 99%  - BMI 25.94 kg/m??     Physical Exam  Vitals and nursing note reviewed.   Constitutional:       General: He is not in acute distress.     Appearance: Normal appearance. He is well-developed. He is not toxic-appearing or diaphoretic.   HENT:      Head: Normocephalic and atraumatic.      Right Ear: External ear normal.      Left Ear: External ear normal.      Nose: No rhinorrhea.   Eyes:      General: No scleral icterus.     Conjunctiva/sclera: Conjunctivae normal.   Cardiovascular:      Rate and Rhythm: Normal rate and regular rhythm.      Pulses:           Dorsalis pedis pulses are 2+ on the right side and 2+ on the left side.        Posterior tibial pulses are 2+ on the right side and 2+ on the left side.      Heart sounds: Normal heart sounds.   Pulmonary:      Effort: Pulmonary effort is normal.      Breath sounds: Normal breath sounds.   Abdominal:      General: Abdomen is flat.      Palpations: Abdomen is soft.      Tenderness: There is no abdominal tenderness.   Musculoskeletal:         General: No deformity. Normal range of motion.      Cervical back: Normal range of motion and neck supple.      Right knee: No swelling, deformity, effusion, erythema, ecchymosis or bony tenderness. Normal range of motion.      Left knee: No swelling, deformity, effusion, erythema, ecchymosis or bony tenderness. Normal range of motion.      Right lower leg: No swelling. No edema.      Left lower leg: No swelling. No edema.   Feet:      Right foot:      Skin integrity: Skin integrity normal.      Left foot:      Skin integrity: Skin integrity normal.   Lymphadenopathy:      Cervical: No cervical adenopathy.   Skin:     General: Skin is warm and dry.      Capillary Refill: Capillary refill takes less than 2 seconds.      Findings: No erythema or rash.   Neurological:      Mental Status: He is alert and oriented to person, place, and time. Sensory: Sensation is intact.      Gait: Gait is intact.   Psychiatric:         Thought Content: Thought content normal.       ED Course     No results found for this visit on 10/17/21.  PVL Venous Duplex Lower Extremity Right    Result Date: 10/17/2021  Patient Info Name:     GALE HULSE Age:     20 years DOB:     03/15/1951 Gender:     Male MRN:     [id number] Accession #:     [id number] [facility] Exam Date:     10/17/2021 12:38 PM Exam Location:     [facility] Patient Status:     Emergency Staff Ordering Physician:     Egbert Garibaldi Sonographer:     Julianne Rice RVT, RDCS  Attending Physician:     Darla Lesches Referring Physician:     Darla Lesches ; [phone] - Exam Type:     PVL VENOUS DUPLEX LOWER EXTREMITY RIGHT Study Info Indications      - pain,  hx of clots Final Impression   * There was no evidence of acute deep vein thrombosis on this right lower extremity venous duplex exam. Lower Extremity Findings   1. Normal compressibility was demonstrated in the right common femoral vein, saphenofemoral junction, femoral vein and popliteal segments in the transverse plane.   2. Phasic flow with good augmentation was demonstrated in the longitudinal plane in the right common femoral vein, saphenofemoral junction, femoral vein and popliteal segments.   3. The right posterior tibial veins and peroneal veins demonstrated color Doppler filling with augmentation in the longitudinal plane and complete compression in the transverse plane.   4. The contralateral left common femoral vein demonstrates spontaneous and phasic flow. Report Signatures Finalized by Lanetta Inch  MD on 10/17/2021 01:57 PM         Medical Decision Making          Egbert Garibaldi, [place]  10/17/21 1359

## 2021-10-17 NOTE — Unmapped (Signed)
PATIENT GIVEN DISCHARGE INSTRUCTIONS AND REVIEWED FOLLOW-UP CARE. PATIENT WITH STABLE VITAL SIGNS AND IS AMBULATORY OUT OF DEPARTMENT WITH ALL BELONGINGS.PATIENT TO FOLLOW-UP WITH PCP AND ORTHO IN 3-5 DAYS.

## 2021-10-17 NOTE — Unmapped (Signed)
Pt to triage with reports of R leg pain. Reports hx of blood clots, on Xarelto & has IVC filter. Reports this pain is very similar.

## 2021-10-28 NOTE — Unmapped (Signed)
[facility] Refill Coordination Note    Specialty Medication(s) to be Shipped:   General Specialty: Praluent    Other medication(s) to be shipped: No additional medications requested for fill at this time     Breyon Sigg, DOB: March 11, 1951  Phone: [phone] (home)       All above HIPAA information was verified with patient's family member, wife.     Was a Nurse, learning disability used for this call? No    Completed refill call assessment today to schedule patient's medication shipment from the [facility] [phone]).  All relevant notes have been reviewed.     Specialty medication(s) and dose(s) confirmed: Regimen is correct and unchanged.   Changes to medications: Maison reports no changes at this time.  Changes to insurance: No  New side effects reported not previously addressed with a pharmacist or physician: None reported  Questions for the pharmacist: No    Confirmed patient received a Conservation officer, historic buildings and a Surveyor, mining with first shipment. The patient will receive a drug information handout for each medication shipped and additional FDA Medication Guides as required.       DISEASE/MEDICATION-SPECIFIC INFORMATION        For patients on injectable medications: Patient currently has 0 doses left.  Next injection is scheduled for 6/27.    SPECIALTY MEDICATION ADHERENCE     Medication Adherence    Patient reported X missed doses in the last month: 0  Specialty Medication: Praluent 75mg /ml  Patient is on additional specialty medications: No  Patient is on more than two specialty medications: No              Were doses missed due to medication being on hold? No        REFERRAL TO PHARMACIST     Referral to the pharmacist: Not needed      [facility]     Shipping address confirmed in Epic.     Delivery Scheduled: Yes, Expected medication delivery date: 6/27.     Medication will be delivered via UPS to the prescription address in Epic WAM.    Westley Gambles   [facility] Specialty Technician

## 2021-11-02 MED FILL — PRALUENT PEN 75 MG/ML SUBCUTANEOUS PEN INJECTOR: SUBCUTANEOUS | 28 days supply | Qty: 2 | Fill #4

## 2021-11-11 NOTE — Unmapped (Unsigned)
[facility] ESTABLISHED PATIENT VISIT    Patient Name: Nathaniel Page Oct 02, 1950 70 y.o.  Date of Encounter: 05/06/2021    PCP: Pieter Partridge, MD   Primary Cardiologist: Dr. Marcelline Deist        Reason for Visit  This patient is a 71 y.o. male returning today for follow up of CAD.  Chief Complaint   Patient presents with    Coronary Artery Disease     Patient denies new or worsening chest pain, shortness of breath, dizziness, palpitations or swelling.        ASSESSMENT/PLAN   Diagnosis ICD-10-CM Associated Orders   1. Coronary artery disease involving native coronary artery of native heart with angina pectoris (CMS-HCC)  I25.119       2. Essential hypertension  I10       3. Shortness of breath  R06.02       4. Mixed hyperlipidemia  E78.2       5. S/P IVC filter  Z95.828           1. Coronary artery disease involving native coronary artery of native heart with angina pectoris (CMS-HCC)  S/P PCI to RCA with residual LAD/diag/marginal disease.  He has no major cp, improved from prior.    2. Essential hypertension  Reports under control, improved over time.    3. Shortness of breath  Doing well, improved over time with weight loss and exercise    4. Mixed hyperlipidemia  Had nHDL 156 in 6/22, intolerant of low dose Crestor.   Placed on Praluent, with lipids 4/23 with TC 102, nHDL 65    5. S/P IVC filter  Remains on Xarelto for recurrent DVT 01/2019  No major bleeding    History of Present Illness    He was seen in 09/2020 with c/o CP.  Stress echo showed no evidence of ischemia.  He had ongoing CP, with subsequent cath and stenting of the RCA.    He reports feeling well overall.  He is getting praluent with good lipid control  He has no major chest pain, now off of imdur with no recurrence. He has no major sob.  He has lost a good deal of weight with attention to diet.    He is doing well, more active, with no major cp and improved sob.  He does need to stay on  [facility] with his recurrent DVTs.    Pertinent visit. He began to experience myalgia. He then tried Crestor 10 mg with no change in myalgia. He notes that he is supposed to see the lipid clinic to discuss starting Praluent or Repatha.   He denies major bruising or bleeding on Plavix and Xarelto.   His BP is well controlled.      Pertinent studies  Cardiac cath- 10/2020- 50% LAD, 75% Diagonal 1, 60% Marginal 1, 80% distal RCA before takeoff of PDAs and PLBs- stent to RCA  Stress echo- 09/2020- Negative stress echo for ischemia, walked 8' with no major symptoms or ekg changes, echo shows no definite ischemia.  Echo- 09/2020- EF 55-60%, grade I DD, trivial MR/TR      Past Medical History  Past Medical History:   Diagnosis Date    Anemia     not recently    Angina pectoris (CMS-HCC)     DR Cherylann Ratel last seen may 2016    Arthritis     shoulders bilaterally    CAD (coronary artery disease)     CHF (congestive heart failure) (CMS-HCC)  Colon polyp     Diverticulitis of colon     Fibromyalgia     GERD (gastroesophageal reflux disease)     Hard to intubate     perforated mucousa in back of throat 1992    Hernia     Hiatal hernia     repaired    Hyperlipidemia     Hypertension     Kidney stones     Lupus (CMS-HCC)     Pneumonia     last year 2015    Rectal bleeding     Ulcerative colitis (CMS-HCC)     recently diagnosed     Past Surgical History:   Procedure Laterality Date    ABDOMINAL SURGERY      CARDIAC CATHETERIZATION      CARDIAC CATHETERIZATION  01/2013    Nonsignificant CAD, with 25-30% disease of the left anterior descending and circumflex.    CHOLECYSTECTOMY      ESOPHAGUS SURGERY      repair, s/p punture during another surgery    GASTROSTOMY W/ FEEDING TUBE  1992    HIATAL HERNIA REPAIR      INGUINAL HERNIA REPAIR      KIDNEY STONE SURGERY      PR CATH PLACE/CORON ANGIO, IMG SUPER/INTERP,W LEFT HEART VENTRICULOGRAPHY N/A 10/14/2020    Procedure: Left Heart Catheterization W Intervention;  Surgeon: Oleta Mouse, MD;  Location: [facility] CATH;  Service: Cardiology PR INS INTRVAS VC FILTR W/WO VAS ACS VSL SELXN RS&I Right 01/27/2019    Procedure: INS OF IVC FILTER, ENDOVASC APPROACH INC VASCULAR ACC, VESSEL SELECTION, AND RADIOLOGICAL SUPERVISION AND INTERPRETATION, INTRAPROCEDURAL ROADMAPPING, AND IMG GUIDANCE ([place] AND FLUORO), WHEN PERFORMED;  Surgeon: Trey Sailors, MD;  Location: [facility];  Service: Vascular    PR PRQ TRLUML CORONARY Francine Graven Eather Colas ONE ART/BRNCH N/A 10/14/2020    Procedure: Percutaneous Coronary Intervention;  Surgeon: Job Founds, MD;  Location: [facility] CATH;  Service: Cardiology    PR REPAIR ING HERNIA,5+Y/O,REDUCIBL Right 12/27/2014    Procedure: open repair of lumbar hernia--right--@ 1:00;  Surgeon: Bea Laura, MD;  Location: [facility];  Service: General Surgery    TONSILLECTOMY      UMBILICAL HERNIA REPAIR         Allergies  Allergies   Allergen Reactions    Dilaudid [Hydromorphone] Nausea And Vomiting    Atorvastatin      myalgias    Rosuvastatin      myalgias    Zanaflex [Tizanidine]        Medications  Current Outpatient Medications   Medication Sig Dispense Refill    alirocumab (PRALUENT PEN) 75 mg/mL PnIj Inject the contents of one pen (75 mg) under the skin every fourteen (14) days. 2 mL 11    amLODIPine (NORVASC) 5 MG tablet Take 1 tablet (5 mg total) by mouth daily. 30 tablet 0    baclofen (LIORESAL) 10 MG tablet Take 10 mg by mouth Three (3) times a day.      cetirizine (ZYRTEC) 10 MG tablet Take 10 mg by mouth daily.      clopidogreL (PLAVIX) 75 mg tablet TAKE ONE TABLET BY MOUTH EVERY MORNING 90 tablet 1    empty container (SHARPS-A-GATOR DISPOSAL SYSTEM) Misc Use as directed for sharps disposal 1 each 2    folic acid (FOLVITE) 1 MG tablet Take 1,000 mcg by mouth daily.      HYDROcodone-acetaminophen (NORCO) 5-325 mg per tablet Take 1 tablet by mouth every four (4)  hours as needed for pain. for up to 30 doses (Patient taking differently: Take 1 tablet by mouth every eight (8) hours as needed for pain.) 30 tablet 0    losartan (COZAAR) 50 MG tablet Take 50 mg by mouth Two (2) times a day.      metoprolol succinate (TOPROL-XL) 50 MG 24 hr tablet TAKE ONE TABLET BY MOUTH EVERY DAY 90 tablet 1    nitroglycerin (NITROSTAT) 0.4 MG SL tablet Place 1 tablet (0.4 mg total) under the tongue every five (5) minutes as needed for chest pain. Maximum of 3 doses in 15 minutes. 25 tablet 3    omeprazole (PRILOSEC) 20 MG capsule Take 20 mg by mouth daily.      pregabalin (LYRICA) 100 MG capsule Take 100 mg by mouth 3 (three) times a day.      ranolazine (RANEXA) 500 MG 12 hr tablet Take 1 tablet (500 mg total) by mouth Two (2) times a day. 180 tablet 3    sulfaSALAzine (AZULFIDINE) 500 mg tablet Take 1,000 mg by mouth Three (3) times a day.       XARELTO 20 mg tablet Take 20 mg by mouth daily with evening meal.       No current facility-administered medications for this visit.       Social History  Social History     Tobacco Use   Smoking Status Never   Smokeless Tobacco Never     Social History     Substance and Sexual Activity   Alcohol Use No    Alcohol/week: 0.0 standard drinks       Family History  Family History   Problem Relation Age of Onset    Heart disease Mother     Heart attack Mother     Heart failure Father     Heart disease Sister        Review of Systems  As noted in the HPI and as follows:    General: Negative for chills, fever, night sweats, or weight changes.   Cardiovascular: Negative for orthopnea, paroxysmal nocturnal dyspnea.  Respiratory: Negative for cough, hemoptysis or wheezing.  Hematological/Lymphatic: No bleeding or clotting difficulties, does not bruise easily.  Neurological: Negative for hemiplegia, dysphasia, visual changes.  Psychiatric: Denies depressive symptoms.    Physical Exam  There were no vitals taken for this visit.     Constitutional: Well developed, well nourished, in no acute distress.  Cardiovascular:  S1 and S2. Regular rate and rhythm. PMI not displaced. No thrills, lifts, or heaves. No jugular vein distension. No carotid bruits. DP/PT/Radials 2+ and equal bilaterally. No edema noted.  Respiratory: Normal respiratory effort with symmetrical lung expansion. Diminished bilaterally.  Neurologic: Alert and oriented X 3.   Psychiatric: Mood and affect appropriate.  Hematologic/Lymphatic/Immunologic: No signs of bleeding or excessive bruising.    I have independently reviewed any recent and prior hospitalizations, notes by other providers, test results ordered by other providers, and reviewed my prior notes.    I have independently interpreted tests performed by cardiologists and agree with their interpretation. I have reviewed these tests with the patient and discussed these findings.     I independently reviewed the results of the cardiology studies that I previously interpreted and reported. I have reviewed these results with the patient and discussed these findings.    I have independently reviewed the images(s) and agree with radiologists interpretation. I have reviewed these results with the patient and discussed these findings.    I  independently reviewed the pathologists laboratory finding(s) and agree with their interpretation. I have reviewed the results with the patient and agree with their findings.    The level of medical decision making during this visit was moderate to high.    This document serves as a record of the services and decisions performed by Marcelline Deist, MD, [facility] on 05/06/2021. It was created on his behalf by De Blanch, a trained medical scribe. The creation of this document is based on the provider's statements and observations that were conveyed to the medical scribe during the patient's encounter.    (The information in this document, created by the medical scribe for me, accurately reflects the services I personally performed and the decisions made by me. I have reviewed and approved this document for accuracy.)     Thank you for the opportunity to participate in Petra Kuba care.     Marcelline Deist, MD, [facility]   [facility])  Electronically signed 11/11/2021 8:23 AM

## 2021-11-12 ENCOUNTER — Ambulatory Visit: Admit: 2021-11-12 | Discharge: 2021-11-13 | Payer: MEDICARE | Attending: Internal Medicine | Primary: Internal Medicine

## 2021-11-12 DIAGNOSIS — Z95828 Presence of other vascular implants and grafts: Principal | ICD-10-CM

## 2021-11-12 DIAGNOSIS — R0602 Shortness of breath: Principal | ICD-10-CM

## 2021-11-12 DIAGNOSIS — I10 Essential (primary) hypertension: Principal | ICD-10-CM

## 2021-11-12 DIAGNOSIS — E782 Mixed hyperlipidemia: Principal | ICD-10-CM

## 2021-11-12 DIAGNOSIS — I25119 Atherosclerotic heart disease of native coronary artery with unspecified angina pectoris: Principal | ICD-10-CM

## 2021-11-12 MED ORDER — METOPROLOL SUCCINATE ER 50 MG TABLET,EXTENDED RELEASE 24 HR
ORAL_TABLET | Freq: Every day | ORAL | 3 refills | 90 days | Status: CP
Start: 2021-11-12 — End: ?

## 2021-11-12 MED ORDER — NITROGLYCERIN 0.4 MG SUBLINGUAL TABLET
ORAL_TABLET | SUBLINGUAL | 3 refills | 1 days | Status: CP | PRN
Start: 2021-11-12 — End: 2022-11-12

## 2021-11-12 MED ORDER — CLOPIDOGREL 75 MG TABLET
ORAL_TABLET | Freq: Every morning | ORAL | 3 refills | 90 days | Status: CP
Start: 2021-11-12 — End: ?

## 2021-11-30 NOTE — Unmapped (Signed)
[facility] Refill Coordination Note    Specialty Medication(s) to be Shipped:   General Specialty: Praluent    Other medication(s) to be shipped: No additional medications requested for fill at this time     Nathaniel Page, DOB: 06-Feb-1951  Phone: [phone] (home)       All above HIPAA information was verified with patient's family member, wife.     Was a Nurse, learning disability used for this call? No    Completed refill call assessment today to schedule patient's medication shipment from the [facility] [phone]).  All relevant notes have been reviewed.     Specialty medication(s) and dose(s) confirmed: Regimen is correct and unchanged.   Changes to medications: Airam reports no changes at this time.  Changes to insurance: No  New side effects reported not previously addressed with a pharmacist or physician: None reported  Questions for the pharmacist: No    Confirmed patient received a Conservation officer, historic buildings and a Surveyor, mining with first shipment. The patient will receive a drug information handout for each medication shipped and additional FDA Medication Guides as required.       DISEASE/MEDICATION-SPECIFIC INFORMATION        For patients on injectable medications: Patient currently has 0 doses left.  Next injection is scheduled for 12/02/21.    SPECIALTY MEDICATION ADHERENCE     Medication Adherence    Patient reported X missed doses in the last month: 0  Specialty Medication: Praluent 75mg /ml  Patient is on additional specialty medications: No  Patient is on more than two specialty medications: No  Any gaps in refill history greater than 2 weeks in the last 3 months: no              Were doses missed due to medication being on hold? No     Praulent  75mg /ml-  -0 days  Supply on  Hand          REFERRAL TO PHARMACIST     Referral to the pharmacist: Not needed      [facility]     Shipping address confirmed in Epic.     Delivery Scheduled: Yes, Expected medication delivery date:  12/02/21.     Medication will be delivered via UPS to the prescription address in Epic WAM.    Ricci Barker   [facility] Specialty Technician

## 2021-12-01 MED FILL — PRALUENT PEN 75 MG/ML SUBCUTANEOUS PEN INJECTOR: SUBCUTANEOUS | 28 days supply | Qty: 2 | Fill #5

## 2021-12-11 ENCOUNTER — Ambulatory Visit
Admit: 2021-12-11 | Discharge: 2021-12-11 | Disposition: A | Discharge status: Home / Self Care | Payer: MEDICARE | Attending: Student in an Organized Health Care Education/Training Program

## 2021-12-11 ENCOUNTER — Emergency Department
Admit: 2021-12-11 | Discharge: 2021-12-11 | Disposition: A | Discharge status: Home / Self Care | Payer: MEDICARE | Attending: Student in an Organized Health Care Education/Training Program

## 2021-12-11 DIAGNOSIS — M25421 Effusion, right elbow: Principal | ICD-10-CM

## 2021-12-11 LAB — CBC W/ AUTO DIFF
BASOPHILS ABSOLUTE COUNT: 0.1 10*9/L (ref 0.0–0.1)
BASOPHILS RELATIVE PERCENT: 0.5 %
EOSINOPHILS ABSOLUTE COUNT: 0.1 10*9/L (ref 0.0–0.5)
EOSINOPHILS RELATIVE PERCENT: 1.4 %
HEMATOCRIT: 46.6 % (ref 40.1–51.0)
HEMOGLOBIN: 15.6 g/dL (ref 13.7–17.5)
IMMATURE GRANULOCYTES ABSOLUTE COUNT: 0.1 10*9/L (ref 0.0–1.0)
IMMATURE GRANULOCYTES RELATIVE PERCENT: 1.4 %
LYMPHOCYTES ABSOLUTE COUNT: 3.5 10*9/L (ref 1.3–3.6)
LYMPHOCYTES RELATIVE PERCENT: 34.6 %
MEAN CORPUSCULAR HEMOGLOBIN CONC: 33.5 g/dL (ref 32.3–36.5)
MEAN CORPUSCULAR HEMOGLOBIN: 32.8 pg — ABNORMAL HIGH (ref 25.7–32.2)
MEAN CORPUSCULAR VOLUME: 98.1 fL — ABNORMAL HIGH (ref 79.0–92.2)
MEAN PLATELET VOLUME: 9.9 fL (ref 9.4–12.4)
MONOCYTES ABSOLUTE COUNT: 1.1 10*9/L — ABNORMAL HIGH (ref 0.3–0.8)
MONOCYTES RELATIVE PERCENT: 10.9 %
NEUTROPHILS ABSOLUTE COUNT: 5.1 10*9/L (ref 1.8–5.4)
NEUTROPHILS RELATIVE PERCENT: 51.2 %
PLATELET COUNT: 270 10*9/L (ref 163–337)
RED BLOOD CELL COUNT: 4.75 10*12/L (ref 4.63–6.08)
RED CELL DISTRIBUTION WIDTH: 13.4 % (ref 11.6–14.4)
WBC ADJUSTED: 10 10*9/L — ABNORMAL HIGH (ref 4.2–9.1)

## 2021-12-11 LAB — BASIC METABOLIC PANEL
ANION GAP: 5 mmol/L — ABNORMAL LOW (ref 9–15)
BLOOD UREA NITROGEN: 15 mg/dL (ref 9–23)
BUN / CREAT RATIO: 19
CALCIUM: 9.5 mg/dL (ref 8.3–10.6)
CHLORIDE: 106 mmol/L (ref 98–107)
CO2: 30.2 mmol/L (ref 20.0–31.0)
CREATININE: 0.8 mg/dL (ref 0.70–1.30)
EGFR CKD-EPI (2021) MALE: >90 mL/min/{1.73_m2} (ref >=60)
GLUCOSE RANDOM: 107 mg/dL — ABNORMAL HIGH (ref 70–99)
POTASSIUM: 3.9 mmol/L (ref 3.5–5.1)
SODIUM: 141 mmol/L (ref 136–145)

## 2021-12-11 LAB — PROTIME-INR
INR: 1.31 — ABNORMAL HIGH (ref 0.89–1.15)
PROTIME: 15.4 s — ABNORMAL HIGH (ref 10.3–13.4)

## 2021-12-11 LAB — APTT: APTT: 32.5 s (ref 24.9–36.9)

## 2021-12-11 LAB — SEDIMENTATION RATE: ERYTHROCYTE SEDIMENTATION RATE: 2 mm/h (ref 0–15)

## 2021-12-11 LAB — C-REACTIVE PROTEIN: C-REACTIVE PROTEIN: <4 mg/L (ref <10.0)

## 2021-12-11 MED ORDER — CEPHALEXIN 500 MG CAPSULE
ORAL_CAPSULE | Freq: Four times a day (QID) | ORAL | 0 refills | 7 days | Status: CP
Start: 2021-12-11 — End: 2021-12-18

## 2021-12-11 MED ADMIN — HYDROcodone-acetaminophen (NORCO) 5-325 mg per tablet 1 tablet: 1 | ORAL | @ 13:00:00 | Stop: 2021-12-11

## 2021-12-11 MED ADMIN — iohexoL (OMNIPAQUE) 300 mg iodine/mL solution 100 mL: 100 mL | INTRAVENOUS | @ 14:00:00 | Stop: 2021-12-11

## 2021-12-11 NOTE — Unmapped (Signed)
Pt arrived ambulatory c/o right elbow swelling since 0400; PT denies any injury but does take xarelto with last 12/10/21.  Right elbow has a significant amount of swelling.

## 2021-12-11 NOTE — Unmapped (Signed)
Ace wrap applied to R elbow / with sling

## 2021-12-11 NOTE — Unmapped (Signed)
Emergency Department Provider Note  Room: 09/09    Medical Decision Making     Nathaniel Page is a 71 y.o. male with a history of blood clots on Xarelto presenting with atraumatic right elbow swelling.  The swelling is outside the joint of the elbow.  No warmth or erythema doubt infection.  Has reasonable range of motion of the elbow giving the extent of the swelling, see picture below.  Doubt septic arthritis.  No history of gout and again this is more surrounding the elbow not necessarily inside the joint itself with no signs of inflammation.  Likely hematoma.  We will get CT scan to further characterize.      Progress Notes     11:32 AM I have reviewed CT scan which looks like subcutaneous hematoma with no joint effusion.  Do not think there is anything to drain currently right now.  I have reviewed this with Dr. Roselie Awkward of orthopedic surgery who has looked at the picture.  Inflammatory markers are low.  Again this is not red or hot, doubt septic bursitis.  Also CT scan showing more blood than infectious looking density so doubt this is septic bursitis.  His elbow is not stiff and motion given the extent of the swelling.  Out of abundance of caution we will put him on Keflex and have him follow-up with orthopedic surgery.  We will have him wrap this and elevate it.  He has hydrocodone at home.    ___    Discussion of Management with other Physicians, QHP or Appropriate Source: Orthopedic surgery  Independent Interpretation of Studies: Tissue swelling on CT scan by my read    Disposition     Clinical Impression:   Final diagnoses:   Elbow swelling, right (Primary)       Final Disposition: dc      History     Chief Complaint:  Elbow Swelling       HPI:  Nathaniel Page is a 71 y.o. male on Xarelto for DVT presenting with right elbow swelling.  Patient reports no trauma.  Went to bed normal.  Woke up with swollen right elbow.  No systemic symptoms.  No fever.  No history of gout    Outside Historian(s): Wife at bedside    MEDICATIONS:   Patient's Medications   New Prescriptions    CEPHALEXIN (KEFLEX) 500 MG CAPSULE    Take 1 capsule (500 mg total) by mouth four (4) times a day for 7 days.   Previous Medications    ALIROCUMAB (PRALUENT PEN) 75 MG/ML PNIJ    Inject the contents of one pen (75 mg) under the skin every fourteen (14) days.    AMLODIPINE (NORVASC) 5 MG TABLET    Take 1 tablet (5 mg total) by mouth daily.    BACLOFEN (LIORESAL) 10 MG TABLET    Take 1 tablet (10 mg total) by mouth Three (3) times a day.    CETIRIZINE (ZYRTEC) 10 MG TABLET    Take 1 tablet (10 mg total) by mouth daily.    CLOPIDOGREL (PLAVIX) 75 MG TABLET    Take 1 tablet (75 mg total) by mouth every morning.    EMPTY CONTAINER (SHARPS-A-GATOR DISPOSAL SYSTEM) MISC    Use as directed for sharps disposal    ESOMEPRAZOLE (NEXIUM) 20 MG CAPSULE    Take 1 capsule (20 mg total) by mouth daily before breakfast.    FOLIC ACID (FOLVITE) 1 MG TABLET    Take 1 tablet (  1,000 mcg total) by mouth daily.    HYDROCODONE-ACETAMINOPHEN (NORCO) 5-325 MG PER TABLET    Take 1 tablet by mouth every four (4) hours as needed for pain. for up to 30 doses    LOSARTAN (COZAAR) 50 MG TABLET    Take 1 tablet (50 mg total) by mouth Two (2) times a day.    METOPROLOL SUCCINATE (TOPROL-XL) 50 MG 24 HR TABLET    Take 1 tablet (50 mg total) by mouth daily.    NITROGLYCERIN (NITROSTAT) 0.4 MG SL TABLET    Place 1 tablet (0.4 mg total) under the tongue every five (5) minutes as needed for chest pain. Maximum of 3 doses in 15 minutes.    OMEPRAZOLE (PRILOSEC) 20 MG CAPSULE    Take 1 capsule (20 mg total) by mouth daily.    PREGABALIN (LYRICA) 100 MG CAPSULE    Take 1 capsule (100 mg total) by mouth in the morning and 1 capsule (100 mg total) at noon and 1 capsule (100 mg total) in the evening.    SULFASALAZINE (AZULFIDINE) 500 MG TABLET    Take 2 tablets (1,000 mg total) by mouth Three (3) times a day.    XARELTO 20 MG TABLET    Take 1 tablet (20 mg total) by mouth daily with evening meal.   Modified Medications    No medications on file   Discontinued Medications    No medications on file       ALLERGIES:   Allergies   Allergen Reactions    Dilaudid [Hydromorphone] Nausea And Vomiting    Atorvastatin      myalgias    Rosuvastatin      myalgias    Zanaflex [Tizanidine]        PAST MEDICAL HISTORY:    Past Medical History:   Diagnosis Date    Anemia     not recently    Angina pectoris (CMS-HCC)     DR Cherylann Ratel last seen may 2016    Arthritis     shoulders bilaterally    CAD (coronary artery disease)     CHF (congestive heart failure) (CMS-HCC)     Colon polyp     Diverticulitis of colon     Fibromyalgia     GERD (gastroesophageal reflux disease)     Hard to intubate     perforated mucousa in back of throat 1992    Hernia     Hiatal hernia     repaired    Hyperlipidemia     Hypertension     Kidney stones     Lupus (CMS-HCC)     Pneumonia     last year 2015    Rectal bleeding     Ulcerative colitis (CMS-HCC)     recently diagnosed       PAST SURGICAL HISTORY:   Past Surgical History:   Procedure Laterality Date    ABDOMINAL SURGERY      CARDIAC CATHETERIZATION      CARDIAC CATHETERIZATION  01/2013    Nonsignificant CAD, with 25-30% disease of the left anterior descending and circumflex.    CHOLECYSTECTOMY      ESOPHAGUS SURGERY      repair, s/p punture during another surgery    GASTROSTOMY W/ FEEDING TUBE  1992    HIATAL HERNIA REPAIR      INGUINAL HERNIA REPAIR      KIDNEY STONE SURGERY      PR CATH PLACE/CORON ANGIO, IMG SUPER/INTERP,W LEFT HEART VENTRICULOGRAPHY N/A 10/14/2020  Procedure: Left Heart Catheterization W Intervention;  Surgeon: Oleta Mouse, MD;  Location: [facility] CATH;  Service: Cardiology    PR INS INTRVAS VC FILTR W/WO VAS ACS VSL SELXN RS&I Right 01/27/2019    Procedure: INS OF IVC FILTER, ENDOVASC APPROACH INC VASCULAR ACC, VESSEL SELECTION, AND RADIOLOGICAL SUPERVISION AND INTERPRETATION, INTRAPROCEDURAL ROADMAPPING, AND IMG GUIDANCE ([place] AND FLUORO), WHEN PERFORMED; Surgeon: Trey Sailors, MD;  Location: [facility];  Service: Vascular    PR PRQ TRLUML CORONARY Francine Graven Eather Colas ONE ART/BRNCH N/A 10/14/2020    Procedure: Percutaneous Coronary Intervention;  Surgeon: Job Founds, MD;  Location: [facility] CATH;  Service: Cardiology    PR REPAIR ING HERNIA,5+Y/O,REDUCIBL Right 12/27/2014    Procedure: open repair of lumbar hernia--right--@ 1:00;  Surgeon: Bea Laura, MD;  Location: [facility];  Service: General Surgery    TONSILLECTOMY      UMBILICAL HERNIA REPAIR         SOCIAL HISTORY:   Social History     Tobacco Use    Smoking status: Never    Smokeless tobacco: Never   Substance Use Topics    Alcohol use: No     Alcohol/week: 0.0 standard drinks       FAMILY HISTORY:    Family History   Problem Relation Age of Onset    Heart disease Mother     Heart attack Mother     Heart failure Father     Heart disease Sister          Physical Exam     Vitals:    12/11/21 1026   BP: 114/63   Pulse: 57   Resp: 12   Temp:    SpO2: 96%       Reviewed vital signs and nursing note as charted by RN.   CONSTITUTIONAL: Alert and oriented and responds appropriately to questions. Well-appearing; in no acute distress  HEAD: Normocephalic; atraumatic  EYES: Conjunctivae clear, sclerae non-icteric  ENT: Moist mucous membranes  NECK: Supple without meningismus  CARD: Skin is well perfused, no JVD  RESP: Normal chest excursion without splinting or tachypnea.  ABD/GI: Nondistended  EXT: Significant swelling on the dorsal aspect of the right elbow, fluctuant with no induration, no erythema or increased warmth.  Able to range the elbow reasonably well without significant pain  SKIN: Normal color for age and race; warm; dry; no acute lesions noted  NEURO: Alert, oriented, Moves all extremities  PSYCH: The patient's mood and manner are appropriate. Grooming and personal hygiene are appropriate       Results     Pertinent labs & imaging results that were available during my care of the patient were reviewed by me and considered in my medical decision making (see chart for details).    Results for orders placed or performed during the hospital encounter of 12/11/21   PTT   Result Value Ref Range    APTT 32.5 24.9 - 36.9 sec   PT-INR   Result Value Ref Range    PT 15.4 (H) 10.3 - 13.4 sec    INR 1.31 (H) 0.89 - 1.15   Basic Metabolic Panel   Result Value Ref Range    Sodium 141 136 - 145 mmol/L    Potassium 3.9 3.5 - 5.1 mmol/L    Chloride 106 98 - 107 mmol/L    CO2 30.2 20.0 - 31.0 mmol/L    Anion Gap 5 (L) 9 - 15 mmol/L    BUN 15  9 - 23 mg/dL    Creatinine 4.54 0.98 - 1.30 mg/dL    BUN/Creatinine Ratio 19     eGFR CKD-EPI (2021) Male >90 >=60 mL/min/1.34m2    Glucose 107 (H) 70 - 99 mg/dL    Calcium 9.5 8.3 - 11.9 mg/dL   Sedimentation Rate   Result Value Ref Range    Sed Rate 2 0 - 15 mm/h   C-reactive protein   Result Value Ref Range    CRP <4.0 <10.0 mg/L   CBC w/ Differential   Result Value Ref Range    WBC 10.0 (H) 4.2 - 9.1 10*9/L    RBC 4.75 4.63 - 6.08 10*12/L    HGB 15.6 13.7 - 17.5 g/dL    HCT 14.7 82.9 - 56.2 %    MCV 98.1 (H) 79.0 - 92.2 fL    MCH 32.8 (H) 25.7 - 32.2 pg    MCHC 33.5 32.3 - 36.5 g/dL    RDW 13.0 86.5 - 78.4 %    MPV 9.9 9.4 - 12.4 fL    Platelet 270 163 - 337 10*9/L    Neutrophils % 51.2 %    Immature Granulocytes Relative Percent 1.4 %    Lymphocytes % 34.6 %    Monocytes % 10.9 %    Eosinophils % 1.4 %    Basophils % 0.5 %    Absolute Neutrophils 5.1 1.8 - 5.4 10*9/L    Immature Granulocytes Absolute Count 0.1 0.0 - 1.0 10*9/L    Absolute Lymphocytes 3.5 1.3 - 3.6 10*9/L    Absolute Monocytes 1.1 (H) 0.3 - 0.8 10*9/L    Absolute Eosinophils 0.1 0.0 - 0.5 10*9/L    Absolute Basophils 0.1 0.0 - 0.1 10*9/L     CT Upper Extremity Right W Contrast   Final Result   1.    Imaging compromised by patient positioning No acute osseous abnormalities   2.    Large posterior elbow subcutaneous hematoma   3.    No significant elbow joint effusion      Signed (Electronic Signature): 12/11/2021 11:03 AM    Signed By: Nino Glow, MD        No results found for this visit on 12/11/21 (from the past 4464 hour(s)).              Druscilla Brownie, MD  12/11/21 1135

## 2021-12-11 NOTE — Unmapped (Signed)
A/ox4 / discharged home with instructions/prescription / verbalized understanding of same / ambulatory to pov / ace wrap and sling intact r elbow / + r radial pulse palpable

## 2022-01-01 NOTE — Unmapped (Signed)
[facility] Specialty Pharmacy Refill Coordination Note    Specialty Medication(s) to be Shipped:   General Specialty: Praluent    Other medication(s) to be shipped: No additional medications requested for fill at this time     Nathaniel Page, DOB: 11/02/1950  Phone: [phone] (home)       All above HIPAA information was verified with patient's family member, wife.     Was a Nurse, learning disability used for this call? No    Completed refill call assessment today to schedule patient's medication shipment from the [facility] [phone]).  All relevant notes have been reviewed.     Specialty medication(s) and dose(s) confirmed: Regimen is correct and unchanged.   Changes to medications: Gearld reports no changes at this time.  Changes to insurance: No  New side effects reported not previously addressed with a pharmacist or physician: None reported  Questions for the pharmacist: No    Confirmed patient received a Conservation officer, historic buildings and a Surveyor, mining with first shipment. The patient will receive a drug information handout for each medication shipped and additional FDA Medication Guides as required.       DISEASE/MEDICATION-SPECIFIC INFORMATION        For patients on injectable medications: Patient currently has 0 doses left.  Next injection is scheduled for 01/05/22.    SPECIALTY MEDICATION ADHERENCE     Medication Adherence    Patient reported X missed doses in the last month: 0  Specialty Medication: Praluent 75mg /ml  Patient is on additional specialty medications: No  Patient is on more than two specialty medications: No  Any gaps in refill history greater than 2 weeks in the last 3 months: no  Demonstrates understanding of importance of adherence: yes                          Were doses missed due to medication being on hold? No     Praulent  75mg /ml-  -0 days  Supply on  Hand          REFERRAL TO PHARMACIST     Referral to the pharmacist: Not needed      [facility]     Shipping address confirmed in Epic.     Delivery Scheduled: Yes, Expected medication delivery date: 01/05/22  .     Medication will be delivered via UPS to the prescription address in Epic WAM.    Ricci Barker   [facility] Specialty Technician

## 2022-01-04 MED FILL — PRALUENT PEN 75 MG/ML SUBCUTANEOUS PEN INJECTOR: SUBCUTANEOUS | 28 days supply | Qty: 2 | Fill #6

## 2022-02-08 DIAGNOSIS — I25119 Atherosclerotic heart disease of native coronary artery with unspecified angina pectoris: Principal | ICD-10-CM

## 2022-02-08 MED FILL — PRALUENT PEN 75 MG/ML SUBCUTANEOUS PEN INJECTOR: SUBCUTANEOUS | 28 days supply | Qty: 2 | Fill #7

## 2022-02-08 NOTE — Unmapped (Signed)
[facility] Specialty Pharmacy Refill Coordination Note    Specialty Medication(s) to be Shipped:   General Specialty: Praluent    Other medication(s) to be shipped: No additional medications requested for fill at this time     Nathaniel Page, DOB: 27-Dec-1950  Phone: [phone] (home)       All above HIPAA information was verified with patient.     Was a Nurse, learning disability used for this call? No    Completed refill call assessment today to schedule patient's medication shipment from the [facility] [phone]).  All relevant notes have been reviewed.     Specialty medication(s) and dose(s) confirmed: Regimen is correct and unchanged.   Changes to medications: [name] reports no changes at this time.  Changes to insurance: No  New side effects reported not previously addressed with a pharmacist or physician: None reported  Questions for the pharmacist: No    Confirmed patient received a Conservation officer, historic buildings and a Surveyor, mining with first shipment. The patient will receive a drug information handout for each medication shipped and additional FDA Medication Guides as required.       DISEASE/MEDICATION-SPECIFIC INFORMATION        For patients on injectable medications: Patient currently has 0 doses left.  Next injection is scheduled for 02/07/22.    SPECIALTY MEDICATION ADHERENCE     Medication Adherence    Patient reported X missed doses in the last month: 0  Specialty Medication: Praluent 75mg /ml  Patient is on additional specialty medications: No  Patient is on more than two specialty medications: No                          Were doses missed due to medication being on hold? No    Praluent 75 mg/ml: 0 days of medicine on hand       REFERRAL TO PHARMACIST     Referral to the pharmacist: Not needed      [facility]     Shipping address confirmed in Epic.     Delivery Scheduled: Yes, Expected medication delivery date: 02/09/22.     Medication will be delivered via UPS to the prescription address in Epic WAM.    Nancy Nordmann [facility] Specialty Technician

## 2022-02-23 ENCOUNTER — Ambulatory Visit: Admit: 2022-02-23 | Payer: MEDICARE

## 2022-02-24 MED ORDER — METOPROLOL SUCCINATE ER 50 MG TABLET,EXTENDED RELEASE 24 HR
ORAL_TABLET | Freq: Every day | ORAL | 1 refills | 0 days
Start: 2022-02-24 — End: ?

## 2022-02-25 MED ORDER — METOPROLOL SUCCINATE ER 50 MG TABLET,EXTENDED RELEASE 24 HR
ORAL_TABLET | Freq: Every day | ORAL | 1 refills | 90 days | Status: CP
Start: 2022-02-25 — End: ?

## 2022-03-11 NOTE — Unmapped (Signed)
[facility] Special[facility] Pharm[facility] Refill Coordination Note    Specialty Medication(s) to be Shipped:   General Specialty: Praluent    Other medication(s) to be shipped: No additional medications requested for fill at this time     Nathaniel Page, DOB: Mar 07, 1951  Phone: [phone] (home)       All above HIPAA information was verified with patient's family member, Wife.     Was a Nurse, learning disability used for this call? No    Completed refill call assessment today to schedule patient's medication shipment from the [facility] [phone]).  All relevant notes have been reviewed.     Specialty medication(s) and dose(s) confirmed: Regimen is correct and unchanged.   Changes to medications: Nathaniel Page reports no changes at this time.  Changes to insurance: No  New side effects reported not previously addressed with a pharmacist or physician: None reported  Questions for the pharmacist: No    Confirmed patient received a Conservation officer, historic buildings and a Surveyor, mining with first shipment. The patient will receive a drug information handout for each medication shipped and additional FDA Medication Guides as required.       DISEASE/MEDICATION-SPECIFIC INFORMATION        For patients on injectable medications: Patient currently has 0 doses left.  Next injection is scheduled for 03/16/22.    SPECIALTY MEDICATION ADHERENCE     Medication Adherence    Patient reported X missed doses in the last month: 0  Specialty Medication: Praluent 75mg /ml  Patient is on additional specialty medications: No  Patient is on more than two specialty medications: No                          Were doses missed due to medication being on hold? No    Praluent 75 mg/ml: 0 days of medicine on hand       REFERRAL TO PHARMACIST     Referral to the pharmacist: Not needed      [facility]     Shipping address confirmed in Epic.     Delivery Scheduled: Yes, Expected medication delivery date: 03/16/22.     Medication will be delivered via UPS to the prescription address in Epic WAM.    Nathaniel Page [facility] Specialty Technician

## 2022-03-15 MED FILL — PRALUENT PEN 75 MG/ML SUBCUTANEOUS PEN INJECTOR: SUBCUTANEOUS | 28 days supply | Qty: 2 | Fill #8

## 2022-04-15 NOTE — Unmapped (Signed)
[facility] Special[facility] Pharm[facility] Refill Coordination Note    Specialty Medication(s) to be Shipped:   General Specialty: Praluent 75mg /ml    Other medication(s) to be shipped: No additional medications requested for fill at this time     Nathaniel Page, DOB: [birth year]  Phone: [phone] (home)       All above HIPAA information was verified with patient's spouse     Was a translator used for this call? No    Completed refill call assessment today to schedule patient's medication shipment from the [facility] [phone]).  All relevant notes have been reviewed.     Specialty medication(s) and dose(s) confirmed: Regimen is correct and unchanged.   Changes to medications: [name] reports no changes at this time.  Changes to insurance: No  New side effects reported not previously addressed with a pharmacist or physician: None reported  Questions for the pharmacist: No    Confirmed patient received a Conservation officer, historic buildings and a Surveyor, mining with first shipment. The patient will receive a drug information handout for each medication shipped and additional FDA Medication Guides as required.       DISEASE/MEDICATION-SPECIFIC INFORMATION        For patients on injectable medications: Patient currently has 0 doses left.  Next injection is scheduled for 04/20/22.    SPECIALTY MEDICATION ADHERENCE     Medication Adherence    Patient reported X missed doses in the last month: 1  Specialty Medication: Praluent 75mg /ml  Patient is on additional specialty medications: No  Informant: spouse                          Were doses missed due to medication being on hold? No    Praluent 75 mg/ml: 0 days of medicine on hand       REFERRAL TO PHARMACIST     Referral to the pharmacist: Not needed      [facility]     Shipping address confirmed in Epic.     Delivery Scheduled: Yes, Expected medication delivery date: 04/20/22.     Medication will be delivered via UPS to the prescription address in Epic WAM.    Jasper Loser   [facility] Specialty Technician

## 2022-04-19 MED FILL — PRALUENT PEN 75 MG/ML SUBCUTANEOUS PEN INJECTOR: SUBCUTANEOUS | 28 days supply | Qty: 2 | Fill #9

## 2022-04-28 NOTE — Unmapped (Signed)
Specialty Medication(s): Praluent    Mr.Kleinschmidt has been dis-enrolled from the [facility] specialty pharmacy services due to patient is deceased.    Additional information provided to the patient: Per message from family, pt passes away on 04-May-2022    Camillo Flaming, PharmD  [facility] Specialty Pharmacist

## 2022-05-10 DEATH — deceased

## 2022-05-31 ENCOUNTER — Emergency Department (HOSPITAL_BASED_OUTPATIENT_CLINIC_OR_DEPARTMENT_OTHER): Payer: Medicare Other | Admitting: Radiology

## 2022-05-31 ENCOUNTER — Other Ambulatory Visit: Payer: Self-pay

## 2022-05-31 ENCOUNTER — Emergency Department (HOSPITAL_BASED_OUTPATIENT_CLINIC_OR_DEPARTMENT_OTHER)
Admission: EM | Admit: 2022-05-31 | Discharge: 2022-05-31 | Disposition: A | Discharge status: Home / Self Care | Payer: Medicare Other | Attending: Emergency Medicine | Admitting: Emergency Medicine

## 2022-05-31 DIAGNOSIS — Z1152 Encounter for screening for COVID-19: Secondary | ICD-10-CM | POA: Diagnosis not present

## 2022-05-31 DIAGNOSIS — I480 Paroxysmal atrial fibrillation: Secondary | ICD-10-CM | POA: Diagnosis not present

## 2022-05-31 DIAGNOSIS — R0602 Shortness of breath: Secondary | ICD-10-CM | POA: Diagnosis present

## 2022-05-31 LAB — CBC WITH DIFFERENTIAL/PLATELET
Abs Immature Granulocytes: 0.04 10*3/uL (ref 0.00–0.07)
Basophils Absolute: 0 10*3/uL (ref 0.0–0.1)
Basophils Relative: 0 %
Eosinophils Absolute: 0.1 10*3/uL (ref 0.0–0.5)
Eosinophils Relative: 1 %
HCT: 53.3 % — ABNORMAL HIGH (ref 39.0–52.0)
Hemoglobin: 17.9 g/dL — ABNORMAL HIGH (ref 13.0–17.0)
Immature Granulocytes: 1 %
Lymphocytes Relative: 21 %
Lymphs Abs: 1.7 10*3/uL (ref 0.7–4.0)
MCH: 31.4 pg (ref 26.0–34.0)
MCHC: 33.6 g/dL (ref 30.0–36.0)
MCV: 93.5 fL (ref 80.0–100.0)
Monocytes Absolute: 0.6 10*3/uL (ref 0.1–1.0)
Monocytes Relative: 7 %
Neutro Abs: 6 10*3/uL (ref 1.7–7.7)
Neutrophils Relative %: 70 %
Platelets: 291 10*3/uL (ref 150–400)
RBC: 5.7 MIL/uL (ref 4.22–5.81)
RDW: 14.6 % (ref 11.5–15.5)
WBC: 8.4 10*3/uL (ref 4.0–10.5)
nRBC: 0 % (ref 0.0–0.2)

## 2022-05-31 LAB — BASIC METABOLIC PANEL
Anion gap: 9 (ref 5–15)
BUN: 20 mg/dL (ref 8–23)
CO2: 24 mmol/L (ref 22–32)
Calcium: 9.2 mg/dL (ref 8.9–10.3)
Chloride: 106 mmol/L (ref 98–111)
Creatinine, Ser: 1.02 mg/dL (ref 0.61–1.24)
GFR, Estimated: >60 mL/min (ref >60)
Glucose, Bld: 97 mg/dL (ref 70–99)
Potassium: 4.3 mmol/L (ref 3.5–5.1)
Sodium: 139 mmol/L (ref 135–145)

## 2022-05-31 LAB — TROPONIN I (HIGH SENSITIVITY)
Troponin I (High Sensitivity): 9 ng/L (ref <18)
Troponin I (High Sensitivity): 9 ng/L (ref <18)

## 2022-05-31 LAB — RESP PANEL BY RT-PCR (RSV, FLU A&B, COVID)  RVPGX2
Influenza A by PCR: NEGATIVE
Influenza B by PCR: NEGATIVE
Resp Syncytial Virus by PCR: NEGATIVE
SARS Coronavirus 2 by RT PCR: NEGATIVE

## 2022-05-31 LAB — BRAIN NATRIURETIC PEPTIDE: B Natriuretic Peptide: 167.8 pg/mL — ABNORMAL HIGH (ref 0.0–100.0)

## 2022-05-31 LAB — D-DIMER, QUANTITATIVE: D-Dimer, Quant: 0.29 ug/mL-FEU (ref 0.00–0.50)

## 2022-05-31 MED ORDER — DILTIAZEM HCL ER 240 MG PO TB24: 240.0000 mg | ORAL_TABLET | Freq: Every day | ORAL | 0 refills | Status: DC

## 2022-05-31 MED ORDER — APIXABAN 2.5 MG PO TABS
5.0000 mg | ORAL_TABLET | Freq: Two times a day (BID) | ORAL | Status: DC
  Administered 2022-05-31: 5 mg via ORAL
  Filled 2022-05-31: qty 2

## 2022-05-31 MED ORDER — APIXABAN 2.5 MG PO TABS: 10.0000 mg | ORAL_TABLET | Freq: Two times a day (BID) | ORAL | Status: DC

## 2022-05-31 MED ORDER — APIXABAN 2.5 MG PO TABS: 5.0000 mg | ORAL_TABLET | Freq: Two times a day (BID) | ORAL | Status: DC

## 2022-05-31 MED ORDER — APIXABAN 5 MG PO TABS: 5.0000 mg | ORAL_TABLET | Freq: Two times a day (BID) | ORAL | 0 refills | Status: DC

## 2022-05-31 MED ORDER — APIXABAN (ELIQUIS) EDUCATION KIT FOR DVT/PE PATIENTS: PACK | Freq: Once | Status: AC

## 2022-05-31 MED ORDER — DILTIAZEM HCL 25 MG/5ML IV SOLN
10.0000 mg | Freq: Once | INTRAVENOUS | Status: AC
  Administered 2022-05-31: 10 mg via INTRAVENOUS
  Filled 2022-05-31: qty 5

## 2022-05-31 MED ORDER — ALBUTEROL SULFATE HFA 108 (90 BASE) MCG/ACT IN AERS: 2.0000 | INHALATION_SPRAY | RESPIRATORY_TRACT | Status: DC | PRN

## 2022-05-31 NOTE — Discharge Instructions (Signed)
Take the medication to control your heart rate as prescribed as well as the blood thinner.  As we discussed the risk of bleeding is increased while you are on the blood thinner and should come to the hospital to be evaluated if you injure your head or any other serious injury.  He may bleed more than expected with the blood thinner.  Follow-up with atrial fibrillation clinic we should call you for an appointment the next several days.  Return to the ED with chest pain, shortness of breath, dizziness or other concerns.

## 2022-05-31 NOTE — ED Provider Notes (Signed)
[facility] Provider Note   CSN: [id number] Arrival date & time: 05/31/22  1128     History  Chief Complaint  Patient presents with   Shortness of Breath   Fatigue    [name] Le is a 72 y.o. male.  Patient sent from urgent care with a 1 week history of cough, fatigue and shortness of breath.  He was found to be in atrial fibrillation which she has no history of.  He is also hypertensive.  States has been coughing for the past week or so has noticed increased difficulty breathing and fatigue.  He has been using over-the-counter remedies for his cough without relief.  He denies any chest pain but does feel some shortness of breath especially with exertion.  Denies fever.  Denies coughing up any mucus.  He does feel fatigued and weak all over.  Denies chest pain, abdominal pain, nausea or vomiting.  No leg pain or leg swelling.  Denies any focal dizziness or lightheadedness. No history of atrial fibrillation or no history of hypertension  The history is provided by the patient.  Shortness of Breath Associated symptoms: no abdominal pain, no fever, no headaches, no rash and no vomiting        Home Medications Prior to Admission medications   Medication Sig Start Date End Date Taking? Authorizing Provider  Anastrozole (ARIMIDEX PO) anastrozole    [provider]  azelastine (ASTELIN) 0.1 % nasal spray Place 2 sprays into both nostrils 2 (two) times daily. 12/02/19   Tasia Catchings, Amy V, PA-C  fluticasone (FLONASE) 50 MCG/ACT nasal spray Place 2 sprays into both nostrils daily. 12/02/19   Tasia Catchings, Amy V, PA-C  omeprazole (PRILOSEC) 20 MG capsule Take 20 mg by mouth daily.    [provider]  predniSONE (DELTASONE) 50 MG tablet Take 1 tablet (50 mg total) by mouth daily with breakfast. 01/25/21   Jaynee Eagles, PA-C  sildenafil (VIAGRA) 100 MG tablet Take 100 mg by mouth daily. 04/19/22   [provider]  tiZANidine (ZANAFLEX) 4 MG  tablet Take 1 tablet (4 mg total) by mouth at bedtime. 01/25/21   Jaynee Eagles, PA-C      Allergies    Patient has no known allergies.    Review of Systems   Review of Systems  Constitutional:  Negative for activity change, appetite change and fever.  HENT:  Negative for congestion and rhinorrhea.   Respiratory:  Positive for shortness of breath. Negative for chest tightness.   Cardiovascular:  Positive for palpitations.  Gastrointestinal:  Negative for abdominal pain, nausea and vomiting.  Genitourinary:  Negative for dysuria and hematuria.  Musculoskeletal:  Negative for arthralgias and myalgias.  Skin:  Negative for rash.  Neurological:  Negative for weakness and headaches.   all other systems are negative except as noted in the HPI and PMH.    Physical Exam Updated Vital Signs BP (!) 166/114 (BP Location: Right Arm)   Pulse (!) 55   Temp 98 F (36.7 C) (Oral)   Resp 16   Ht 5\' 6"  (1.676 m)   Wt 83.5 kg   SpO2 96%   BMI 29.70 kg/m  Physical Exam Vitals and nursing note reviewed.  Constitutional:      General: He is not in acute distress.    Appearance: He is well-developed.  HENT:     Head: Normocephalic and atraumatic.     Mouth/Throat:     Pharynx: No oropharyngeal exudate.  Eyes:  Conjunctiva/sclera: Conjunctivae normal.     Pupils: Pupils are equal, round, and reactive to light.  Neck:     Comments: No meningismus. Cardiovascular:     Rate and Rhythm: Normal rate. Rhythm irregular.     Heart sounds: Normal heart sounds. No murmur heard.    Comments: irregular tachycardia 80s to 120s Pulmonary:     Effort: Pulmonary effort is normal. No respiratory distress.     Breath sounds: Normal breath sounds.  Chest:     Chest wall: No tenderness.  Abdominal:     Palpations: Abdomen is soft.     Tenderness: There is no abdominal tenderness. There is no guarding or rebound.  Musculoskeletal:        General: No tenderness. Normal range of motion.     Cervical  back: Normal range of motion and neck supple.  Skin:    General: Skin is warm.  Neurological:     Mental Status: He is alert and oriented to person, place, and time.     Cranial Nerves: No cranial nerve deficit.     Motor: No abnormal muscle tone.     Coordination: Coordination normal.     Comments: No ataxia on finger to nose bilaterally. No pronator drift. 5/5 strength throughout. CN 2-12 intact.Equal grip strength. Sensation intact.   Psychiatric:        Behavior: Behavior normal.    ED Results / Procedures / Treatments   Labs (all labs ordered are listed, but only abnormal results are displayed) Labs Reviewed  CBC WITH DIFFERENTIAL/PLATELET - Abnormal; Notable for the following components:      Result Value   Hemoglobin 17.9 (*)    HCT 53.3 (*)    All other components within normal limits  BRAIN NATRIURETIC PEPTIDE - Abnormal; Notable for the following components:   B Natriuretic Peptide 167.8 (*)    All other components within normal limits  RESP PANEL BY RT-PCR (RSV, FLU A&B, COVID)  RVPGX2  BASIC METABOLIC PANEL  D-DIMER, QUANTITATIVE  TROPONIN I (HIGH SENSITIVITY)  TROPONIN I (HIGH SENSITIVITY)    EKG EKG Interpretation  Date/Time:  Monday May 31 2022 11:36:47 EST Ventricular Rate:  108 PR Interval:    QRS Duration: 78 QT Interval:  284 QTC Calculation: 380 R Axis:   40 Text Interpretation: Atrial fibrillation with rapid ventricular response Anteroseptal infarct , age undetermined Abnormal ECG No previous ECGs available No previous ECGs available Confirmed by Ezequiel Essex [phone]) on 05/31/2022 11:39:47 AM  Radiology DG Chest 2 View  Result Date: 05/31/2022 CLINICAL DATA:  Shortness of breath EXAM: CHEST - 2 VIEW COMPARISON:  None Available. FINDINGS: The heart size and mediastinal contours are within normal limits. Aortic atherosclerosis. Both lungs are clear. The visualized skeletal structures are unremarkable. IMPRESSION: No active cardiopulmonary  disease. Electronically Signed   By: Davina Poke D.O.   On: 05/31/2022 12:24    Procedures Procedures    Medications Ordered in ED Medications  albuterol (VENTOLIN HFA) 108 (90 Base) MCG/ACT inhaler 2 puff (has no administration in time range)  diltiazem (CARDIZEM) injection 10 mg (has no administration in time range)    ED Course/ Medical Decision Making/ A&P                             Medical Decision Making Amount and/or Complexity of Data Reviewed Labs: ordered. Decision-making details documented in ED Course. Radiology: ordered and independent interpretation performed. Decision-making details documented in  ED Course. ECG/medicine tests: ordered and independent interpretation performed. Decision-making details documented in ED Course.  Risk Prescription drug management.   Sent from urgent care with shortness of breath and cough for the past 1 week found to be in A-fib with RVR with no history of same.  Also hypertensive.  No hypoxia or increased work of breathing.  Not cardioversion candidate as unknown onset of arrhythmia.    Elevated blood pressure and atrial fibrillation with RVR.  New onset.  Patient denies any history of hypertension.  Chest x-ray negative for infiltrate.  Results reviewed and interpreted by me.  Troponin negative, D-dimer negative.  Low suspicion for ACS or pulmonary embolism.  Discussed with Dr. Shari Prows of cardiology.  She agrees with 240 mg of Cardizem and Eliquis and outpatient follow-up.  CHA2DS2-VASc  is 2.  Blood pressure has improved to 164/99.  Heart rate in the 80s and 90s.  Patient denies any chest pain or shortness of breath.  Troponin negative x 2 with low suspicion for ACS.  Heart rate is well-controlled  Will proceed with plan for discharge home with Cardizem as well as Eliquis.  Follow-up with A-fib clinic this week.  Return to the ED with exertional chest pain, pain associate with shortness of breath, nausea, vomiting, sweating,  other concerns.        Final Clinical Impression(s) / ED Diagnoses Final diagnoses:  Paroxysmal atrial fibrillation [facility])    Rx / DC Orders ED Discharge Orders     None         Cassius Cullinane, Jeannett Senior, MD 05/31/22 1621

## 2022-05-31 NOTE — ED Triage Notes (Signed)
Patient arrives with complaints of shortness of breath and fatigue x1 week. Patient went to UC earlier and was noted to be in Atrial Fibrillation on the EKG. Sent here for further evaluation due to new onset a. Fib.   Reports no pain.

## 2022-06-10 ENCOUNTER — Encounter (HOSPITAL_COMMUNITY): Payer: Self-pay | Admitting: Nurse Practitioner

## 2022-06-10 ENCOUNTER — Ambulatory Visit (HOSPITAL_COMMUNITY)
Admission: RE | Admit: 2022-06-10 | Discharge: 2022-06-10 | Disposition: A | Discharge status: Home / Self Care | Payer: Medicare Other | Source: Ambulatory Visit | Attending: Nurse Practitioner | Admitting: Nurse Practitioner

## 2022-06-10 VITALS — BP 120/90 | HR 74 | Ht 66.0 in | Wt 180.0 lb

## 2022-06-10 DIAGNOSIS — Z79899 Other long term (current) drug therapy: Secondary | ICD-10-CM | POA: Diagnosis not present

## 2022-06-10 DIAGNOSIS — Z7901 Long term (current) use of anticoagulants: Secondary | ICD-10-CM | POA: Diagnosis not present

## 2022-06-10 DIAGNOSIS — I4891 Unspecified atrial fibrillation: Secondary | ICD-10-CM

## 2022-06-10 DIAGNOSIS — I10 Essential (primary) hypertension: Secondary | ICD-10-CM | POA: Insufficient documentation

## 2022-06-10 MED ORDER — APIXABAN 5 MG PO TABS: 5.0000 mg | ORAL_TABLET | Freq: Two times a day (BID) | ORAL | 3 refills | Status: DC

## 2022-06-10 MED ORDER — DILTIAZEM HCL ER 240 MG PO TB24: 240.0000 mg | ORAL_TABLET | Freq: Every day | ORAL | 3 refills | Status: DC

## 2022-06-10 NOTE — Progress Notes (Signed)
Primary Care Physician: Patient, No Pcp Per Referring Physician: ED f/u    [name] Le is a 72 y.o. male with h/o HTN, that presented to urgent care for symptoms of URI, and was found to have new onset afib, asymptomatic. He was also found to be hypertensive. He was discharged on 240 mg Cardizem and eliquis for a CHA2DS2VASc  score of 2.  He was asked to  f/u with afib clinic. EKG today shows afib rate controlled. He  feels that he may have been out of rhythm for several months.  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 He has noted sone dizziness working on a ladder so his boss has him working on jobs that do not require a Civil Service fast streamer. He is taking the meds that were rx'ed thru the ED. He is rate controlled.   Today, he denies symptoms of palpitations, chest pain, shortness of breath, orthopnea, PND, lower extremity edema, dizziness, presyncope, syncope, or neurologic sequela. The patient is tolerating medications without difficulties and is otherwise without complaint today.   Past Medical History:  Diagnosis Date   Closed fracture of tuft of distal phalanx of finger 90's   left   Closed fracture of tuft of distal phalanx of right thumb with malunion May '14   ORIF with pin Veronia Beets)   ED (erectile dysfunction)    GERD (gastroesophageal reflux disease)    Lumbar degenerative disc disease    no problems that limit activity   MRSA (methicillin resistant staph aureus) culture positive 06/12/2012   left axilla   Past Surgical History:  Procedure Laterality Date   LACERATION REPAIR Left    chain laceration left forearm - 13 stitches.   TONSILLECTOMY     tuft fracture Left may '14   ORIF with PIN ( [facility]ammig)    Current Outpatient Medications  Medication Sig Dispense Refill   Anastrozole (ARIMIDEX PO) anastrozole     apixaban (ELIQUIS) 5 MG TABS tablet Take 1 tablet (5 mg total) by mouth 2 (two) times daily. 60 tablet 0   azelastine (ASTELIN) 0.1 %  nasal spray Place 2 sprays into both nostrils 2 (two) times daily. 30 mL 0   diltiazem (CARDIZEM LA) 240 MG 24 hr tablet Take 1 tablet (240 mg total) by mouth daily. 30 tablet 0   fluticasone (FLONASE) 50 MCG/ACT nasal spray Place 2 sprays into both nostrils daily. 1 g 0   omeprazole (PRILOSEC) 20 MG capsule Take 20 mg by mouth daily.     predniSONE (DELTASONE) 50 MG tablet Take 1 tablet (50 mg total) by mouth daily with breakfast. 5 tablet 0   sildenafil (VIAGRA) 100 MG tablet Take 100 mg by mouth daily.     tiZANidine (ZANAFLEX) 4 MG tablet Take 1 tablet (4 mg total) by mouth at bedtime. 30 tablet 0   No current facility-administered medications for this encounter.    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 Married '78, 1 dtr - '81, 1 son - '79, 1 grandchild. Work - Oceanographer. Marriage in good health.   Social Determinants of Health   Financial Resource Strain: Not on file  Food Insecurity: Not on file  Transportation Needs: Not on file  Physical Activity: Not on file  Stress: Not on file  Social Connections: Not on file  Intimate Partner Violence: Not on file    Family History  Problem Relation Age of Onset   Lung cancer Mother    Cancer Mother        lung with mets    ROS- All systems are reviewed and negative except as per the  HPI above  Physical Exam: Vitals:   06/10/22 0913  Weight: 81.6 kg  Height: 5\' 6"  (1.676 m)   Wt Readings from Last 3 Encounters:  06/10/22 81.6 kg  05/31/22 83.5 kg  11/08/16 77 kg    Labs: Lab Results  Component Value Date   NA 139 05/31/2022   K 4.3 05/31/2022   CL 106 05/31/2022   CO2 24 05/31/2022   GLUCOSE 97 05/31/2022   BUN 20 05/31/2022   CREATININE 1.02 05/31/2022   CALCIUM 9.2 05/31/2022   No results found for: "INR" Lab Results  Component Value Date   CHOL 183 11/08/2016   HDL 52.40 11/08/2016   LDLCALC 114 (H) 11/08/2016   TRIG 82.0 11/08/2016     GEN- The patient is well appearing, alert and oriented x 3 today.   Head- normocephalic, atraumatic Eyes-  Sclera clear, conjunctiva pink Ears- hearing intact Oropharynx- clear Neck- supple, no JVP Lymph- no cervical lymphadenopathy Lungs- Clear to ausculation bilaterally, normal work of breathing Heart- irregular rate and rhythm, no murmurs, rubs or gallops, PMI not laterally displaced GI- soft, NT, ND, + BS Extremities- no clubbing, cyanosis, or edema MS- no significant deformity or atrophy Skin- no rash or lesion Psych- euthymic mood, full affect Neuro- strength and sensation are intact  EKG-Vent. rate 74 BPM PR interval * ms QRS duration 72 ms QT/QTcB 352/390 ms P-R-T axes * -31 39 Atrial fibrillation Left axis deviation Anteroseptal infarct , age undetermined Abnormal ECG When compared with ECG of 22-Gaudencio-2024 11:36, PREVIOUS ECG IS PRESENT    Assessment and Plan:  1. New onset Afib General education re afib  Triggers discussed  Minimal alcohol  Energy drinks to be avoided  Does not drink coffee, occasional soft drink  Wife states he snores with apnea noted Sleep study ordered  Will order an echo when in SR  Continue Cardizem  240 mg daily   2. CHA2DS2VASc  score of 2(htn/age) Continue eliquis 5 mg bid  Emphasized compliance  with taking drug with a possible cardioversion in  the future  Bleeding precautions discussed    3. HTN Stable today   4. Lack of PCP Encouraged to obtain a PCP  I will see back in  2 weeks   Butch Penny C. Veatrice Eckstein, [facility] [address] [place], [place] [address] [phone]

## 2022-06-11 ENCOUNTER — Telehealth (HOSPITAL_COMMUNITY): Payer: Self-pay | Admitting: *Deleted

## 2022-06-11 NOTE — Telephone Encounter (Signed)
Pt called to inform he missed his evening dose of eliquis on 2/1

## 2022-06-17 ENCOUNTER — Encounter (HOSPITAL_COMMUNITY): Payer: Self-pay | Admitting: *Deleted

## 2022-06-23 ENCOUNTER — Ambulatory Visit (HOSPITAL_COMMUNITY)
Admission: RE | Admit: 2022-06-23 | Discharge: 2022-06-23 | Disposition: A | Discharge status: Home / Self Care | Payer: Medicare Other | Source: Ambulatory Visit | Attending: Nurse Practitioner | Admitting: Nurse Practitioner

## 2022-06-23 VITALS — BP 130/72 | HR 66 | Ht 66.0 in | Wt 180.4 lb

## 2022-06-23 DIAGNOSIS — I10 Essential (primary) hypertension: Secondary | ICD-10-CM | POA: Diagnosis not present

## 2022-06-23 DIAGNOSIS — Z7901 Long term (current) use of anticoagulants: Secondary | ICD-10-CM | POA: Insufficient documentation

## 2022-06-23 DIAGNOSIS — I4891 Unspecified atrial fibrillation: Secondary | ICD-10-CM

## 2022-06-23 DIAGNOSIS — Z79899 Other long term (current) drug therapy: Secondary | ICD-10-CM | POA: Insufficient documentation

## 2022-06-23 DIAGNOSIS — I4819 Other persistent atrial fibrillation: Secondary | ICD-10-CM | POA: Diagnosis not present

## 2022-06-23 DIAGNOSIS — D6869 Other thrombophilia: Secondary | ICD-10-CM

## 2022-06-23 DIAGNOSIS — Z006 Encounter for examination for normal comparison and control in clinical research program: Secondary | ICD-10-CM

## 2022-06-23 NOTE — Research (Signed)
[facility] Informed Consent   Subject Name: [name] Le  Subject met inclusion and exclusion criteria.  The informed consent form, study requirements and expectations were reviewed with the subject and questions and concerns were addressed prior to the signing of the consent form.  The subject verbalized understanding of the trial requirements.  The subject agreed to participate in the [facility] trial and signed the informed consent on 14/Feb/2024.  The informed consent was obtained prior to performance of any protocol-specific procedures for the subject.  A copy of the signed informed consent was given to the subject and a copy was placed in the subject's medical record.   Tori Milks [name]

## 2022-06-23 NOTE — Patient Instructions (Signed)
Cardioversion scheduled for: Thursday, March 7th   - Arrive at the [facility] and go to admitting at [facility] not eat or drink anything after midnight the night prior to your procedure.   - Take all your morning medication (except diabetic medications) with a sip of water prior to arrival.  - You will not be able to drive home after your procedure.    - Do NOT miss any doses of your blood thinner - if you should miss a dose please notify our office immediately.   - If you feel as if you go back into normal rhythm prior to scheduled cardioversion, please notify our office immediately.  If your procedure is canceled in the cardioversion suite you will be charged a cancellation fee.

## 2022-06-23 NOTE — Progress Notes (Addendum)
Primary Care Physician: Patient, No Pcp Per Referring Physician: ED f/u    Trueman A Bunda is a 72 y.o. male with h/o HTN, that presented to urgent care for symptoms of URI, and was found to have new onset afib, asymptomatic. He was also found to be hypertensive. He was discharged on 240 mg Cardizem and eliquis for a CHA2DS2VASc  score of 2.  He was asked to  f/u with afib clinic. EKG today shows afib rate controlled. He  feels that he may have been out of rhythm for several months.  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 He has noted sone dizziness working on a ladder so his boss has him working on jobs that do not require a Civil Service fast streamer. He is taking the meds that were rx'ed thru the ED. He is rate controlled.   F/u in afib clinic, 06/23/22. He remains in rate controlled afib. We discussed pursing cardioversion and he is in agreement. Compliant with anticoagulation since 06/11/22.   Today, he denies symptoms of palpitations, chest pain, shortness of breath, orthopnea, PND, lower extremity edema, dizziness, presyncope, syncope, or neurologic sequela. The patient is tolerating medications without difficulties and is otherwise without complaint today.   Past Medical History:  Diagnosis Date   Closed fracture of tuft of distal phalanx of finger 90's   left   Closed fracture of tuft of distal phalanx of right thumb with malunion May '14   ORIF with pin Veronia Beets)   ED (erectile dysfunction)    GERD (gastroesophageal reflux disease)    Lumbar degenerative disc disease    no problems that limit activity   MRSA (methicillin resistant staph aureus) culture positive 06/12/2012   left axilla   Past Surgical History:  Procedure Laterality Date   LACERATION REPAIR Left    chain laceration left forearm - 13 stitches.   TONSILLECTOMY     tuft fracture Left may '14   ORIF with PIN ( [facility]ig)    Current Outpatient Medications  Medication Sig Dispense Refill    Anastrozole (ARIMIDEX PO) Take 0.5 tablets by mouth every 14 (fourteen) days.     apixaban (ELIQUIS) 5 MG TABS tablet Take 1 tablet (5 mg total) by mouth 2 (two) times daily. 60 tablet 3   diltiazem (CARDIZEM LA) 240 MG 24 hr tablet Take 1 tablet (240 mg total) by mouth daily. 30 tablet 3   famotidine (PEPCID) 20 MG tablet Take 2 tablets by mouth every morning.     fexofenadine (ALLEGRA) 180 MG tablet Take 180 mg by mouth as needed for allergies or rhinitis.     sildenafil (VIAGRA) 100 MG tablet Take 100 mg by mouth as needed.     No current facility-administered medications for this encounter.    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 Married '78, 1 dtr - '81, 1 son - '79, 1 grandchild. Work - Oceanographer. Marriage in good health.   Social Determinants of Health   Financial Resource Strain: Not on file  Food Insecurity: Not on file  Transportation Needs: Not on file  Physical Activity: Not on file  Stress: Not on file  Social Connections: Not on file  Intimate Partner Violence: Not on file    Family History  Problem Relation Age of Onset   Lung cancer Mother    Cancer Mother        lung with mets    ROS- All systems are reviewed and negative except as per the HPI above  Physical Exam: Vitals:   06/23/22 0830  BP: 130/72  Pulse:  66  Weight: 81.8 kg  Height: 5' 6"$  (1.676 m)   Wt Readings from Last 3 Encounters:  06/23/22 81.8 kg  06/10/22 81.6 kg  05/31/22 83.5 kg    Labs: Lab Results  Component Value Date   NA 139 05/31/2022   K 4.3 05/31/2022   CL 106 05/31/2022   CO2 24 05/31/2022   GLUCOSE 97 05/31/2022   BUN 20 05/31/2022   CREATININE 1.02 05/31/2022   CALCIUM 9.2 05/31/2022   No results found for: "INR" Lab Results  Component Value Date   CHOL 183 11/08/2016   HDL 52.40 11/08/2016   LDLCALC 114 (H) 11/08/2016   TRIG 82.0 11/08/2016     GEN- The patient is well appearing, alert and oriented x 3 today.   Head- normocephalic, atraumatic Eyes-  Sclera clear, conjunctiva pink Ears- hearing intact Oropharynx- clear Neck- supple, no JVP Lymph- no cervical lymphadenopathy Lungs- Clear to ausculation bilaterally, normal work of breathing Heart- irregular rate and rhythm, no murmurs, rubs or gallops, PMI not laterally displaced GI- soft, NT, ND, + BS Extremities- no clubbing, cyanosis, or edema MS- no significant deformity or atrophy Skin- no rash or lesion Psych- euthymic mood, full affect Neuro- strength and sensation are intact  EKG- Vent. rate 66 BPM PR interval * ms QRS duration 82 ms QT/QTcB 370/387 ms P-R-T axes * -25 37 Atrial fibrillation Anteroseptal infarct , age undetermined Abnormal ECG When compared with ECG of 10-Jun-2022 09:41, PREVIOUS ECG IS PRESENT    Assessment and Plan:  1. New onset persistent  Afib, dx  January 2024 General education re afib  Triggers discussed  Minimal alcohol  Energy drinks to be avoided  Does not drink coffee, occasional soft drink  Wife states he snores with apnea noted Sleep study ordered  Will order an echo when in SR  Continue Cardizem  240 mg daily  Will schedule for cardioversion, risk vrs benefit discussed, pt and wife wish to proceed  Cbc/bmet   2. CHA2DS2VASc  score of 2(htn/age) Continue eliquis 5 mg bid  Emphasized  compliance  with taking drug with   cardioversion in the future    3. HTN Stable today   4. Lack of PCP Encouraged to obtain a PCP  Return to afib clinic one week after cardioversion   Butch Penny C. Aylan Bayona, [facility] [address] [place], [place] [address] [phone]

## 2022-07-06 ENCOUNTER — Encounter (HOSPITAL_COMMUNITY): Payer: Self-pay

## 2022-07-06 ENCOUNTER — Other Ambulatory Visit (HOSPITAL_COMMUNITY): Payer: Self-pay | Admitting: *Deleted

## 2022-07-08 ENCOUNTER — Other Ambulatory Visit (HOSPITAL_COMMUNITY): Payer: Medicare Other | Admitting: Nurse Practitioner

## 2022-07-20 ENCOUNTER — Encounter (HOSPITAL_COMMUNITY): Payer: Self-pay | Admitting: Nurse Practitioner

## 2022-07-20 ENCOUNTER — Ambulatory Visit (HOSPITAL_COMMUNITY)
Admission: RE | Admit: 2022-07-20 | Discharge: 2022-07-20 | Disposition: A | Discharge status: Home / Self Care | Payer: Medicare Other | Source: Ambulatory Visit | Attending: Nurse Practitioner | Admitting: Nurse Practitioner

## 2022-07-20 VITALS — BP 118/82 | HR 80 | Ht 66.0 in | Wt 180.2 lb

## 2022-07-20 DIAGNOSIS — Z79899 Other long term (current) drug therapy: Secondary | ICD-10-CM | POA: Diagnosis not present

## 2022-07-20 DIAGNOSIS — I4891 Unspecified atrial fibrillation: Secondary | ICD-10-CM

## 2022-07-20 DIAGNOSIS — D6869 Other thrombophilia: Secondary | ICD-10-CM | POA: Diagnosis not present

## 2022-07-20 DIAGNOSIS — I4819 Other persistent atrial fibrillation: Secondary | ICD-10-CM | POA: Insufficient documentation

## 2022-07-20 DIAGNOSIS — Z7901 Long term (current) use of anticoagulants: Secondary | ICD-10-CM | POA: Insufficient documentation

## 2022-07-20 LAB — BASIC METABOLIC PANEL
Anion gap: 13 (ref 5–15)
BUN: 16 mg/dL (ref 8–23)
CO2: 22 mmol/L (ref 22–32)
Calcium: 9.1 mg/dL (ref 8.9–10.3)
Chloride: 104 mmol/L (ref 98–111)
Creatinine, Ser: 1.41 mg/dL — ABNORMAL HIGH (ref 0.61–1.24)
GFR, Estimated: 54 mL/min — ABNORMAL LOW (ref >60)
Glucose, Bld: 104 mg/dL — ABNORMAL HIGH (ref 70–99)
Potassium: 4.3 mmol/L (ref 3.5–5.1)
Sodium: 139 mmol/L (ref 135–145)

## 2022-07-20 LAB — CBC
HCT: 55.3 % — ABNORMAL HIGH (ref 39.0–52.0)
Hemoglobin: 18.6 g/dL — ABNORMAL HIGH (ref 13.0–17.0)
MCH: 30.1 pg (ref 26.0–34.0)
MCHC: 33.6 g/dL (ref 30.0–36.0)
MCV: 89.5 fL (ref 80.0–100.0)
Platelets: 325 10*3/uL (ref 150–400)
RBC: 6.18 MIL/uL — ABNORMAL HIGH (ref 4.22–5.81)
RDW: 13.6 % (ref 11.5–15.5)
WBC: 7.1 10*3/uL (ref 4.0–10.5)
nRBC: 0 % (ref 0.0–0.2)

## 2022-07-20 NOTE — Progress Notes (Signed)
Primary Care Physician: Patient, No Pcp Per Referring Physician: ED f/u    Purcell A Cheyney is a 72 y.o. male with h/o HTN, that presented to urgent care for symptoms of URI, and was found to have new onset afib, asymptomatic. He was also found to be hypertensive. He was discharged on 240 mg Cardizem and eliquis for a CHA2DS2VASc  score of 2.  He was asked to  f/u with afib clinic. EKG today shows afib rate controlled. He  feels that he may have been out of rhythm for several months.  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 He has noted sone dizziness working on a ladder so his boss has him working on jobs that do not require a Civil Service fast streamer. He is taking the meds that were rx'ed thru the ED. He is rate controlled.   F/u in afib clinic, 06/23/22. He remains in rate controlled afib. We discussed pursing cardioversion and he is in agreement. Compliant with anticoagulation since 06/11/22.   F/u in afib clinic, 07/20/22. Pt is scheduled for cardioversion 3/21. It had to be delayed from  my visit on 2/14 for missed doses of DOAC. He has now not missed any doses for the last 3 weeks. His CV is scheduled for 3/21. He remains in rate controlled afib.   Today, he denies symptoms of palpitations, chest pain, shortness of breath, orthopnea, PND, lower extremity edema, dizziness, presyncope, syncope, or neurologic sequela. The patient is tolerating medications without difficulties and is otherwise without complaint today.   Past Medical History:  Diagnosis Date   Closed fracture of tuft of distal phalanx of finger 90's   left   Closed fracture of tuft of distal phalanx of right thumb with malunion May '14   ORIF with pin Veronia Beets)   ED (erectile dysfunction)    GERD (gastroesophageal reflux disease)    Lumbar degenerative disc disease    no problems that limit activity   MRSA (methicillin resistant staph aureus) culture positive 06/12/2012   left axilla   Past Surgical  History:  Procedure Laterality Date   LACERATION REPAIR Left    chain laceration left forearm - 13 stitches.   TONSILLECTOMY     tuft fracture Left may '14   ORIF with PIN ( Grammig)    Current Outpatient Medications  Medication Sig Dispense Refill   Anastrozole (ARIMIDEX PO) Take 0.5 tablets by mouth every 14 (fourteen) days.     apixaban (ELIQUIS) 5 MG TABS tablet Take 1 tablet (5 mg total) by mouth 2 (two) times daily. 60 tablet 3   diltiazem (CARDIZEM LA) 240 MG 24 hr tablet Take 1 tablet (240 mg total) by mouth daily. 30 tablet 3   famotidine (PEPCID) 20 MG tablet Take 2 tablets by mouth every morning.     fexofenadine (ALLEGRA) 180 MG tablet Take 180 mg by mouth as needed for allergies or rhinitis.     sildenafil (VIAGRA) 100 MG tablet Take 100 mg by mouth as needed.     No current facility-administered medications for this encounter.    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 Married '78, 1 dtr - '81, 1 son - '79, 1 grandchild. Work - Oceanographer. Marriage in good health.   Social Determinants of Health   Financial Resource Strain: Not on file  Food Insecurity: Not on file  Transportation Needs: Not on file  Physical Activity: Not on file  Stress: Not on file  Social Connections: Not on file  Intimate Partner Violence: Not on  file    Family History  Problem Relation Age of Onset   Lung cancer Mother    Cancer Mother        lung with mets    ROS- All systems are reviewed and negative except as per the HPI above  Physical Exam: Vitals:   07/20/22 0829  BP: 118/82  Pulse: 80  Weight: 81.7 kg  Height: '5\' 6"'$  (1.676 m)   Wt Readings from Last 3 Encounters:  07/20/22 81.7 kg  06/23/22 81.8 kg  06/10/22 81.6 kg    Labs: Lab Results  Component Value Date   NA 139 05/31/2022   K 4.3 05/31/2022   CL 106 05/31/2022   CO2 24 05/31/2022   GLUCOSE 97 05/31/2022   BUN 20 05/31/2022   CREATININE 1.02 05/31/2022   CALCIUM 9.2 05/31/2022   No results found for: "INR" Lab Results  Component Value Date   CHOL 183 11/08/2016   HDL 52.40 11/08/2016   LDLCALC 114 (H) 11/08/2016   TRIG 82.0 11/08/2016     GEN- The patient is well appearing, alert and oriented x 3 today.   Head- normocephalic, atraumatic Eyes-  Sclera clear, conjunctiva pink Ears- hearing intact Oropharynx- clear Neck- supple, no JVP Lymph- no cervical lymphadenopathy Lungs- Clear to ausculation bilaterally, normal work of breathing Heart- irregular rate and rhythm, no murmurs, rubs or gallops, PMI not laterally displaced GI- soft, NT, ND, + BS Extremities- no clubbing, cyanosis, or edema MS- no significant deformity or atrophy Skin- no rash or lesion Psych- euthymic mood, full affect Neuro- strength and sensation are intact  EKG- Vent. rate 80 BPM PR interval * ms QRS duration 78 ms QT/QTcB 350/403 ms P-R-T axes * 2 55 Atrial fibrillation Anteroseptal infarct , age undetermined Abnormal ECG When compared with ECG of 23-Jun-2022 08:42, PREVIOUS ECG IS PRESENT    Assessment and Plan:  1. New onset persistent  Afib, dx  January 2024 Sleep study pending  Will order an echo when in SR  Continue Cardizem  240 mg daily  Scheduled for cardioversion, risk vrs benefit discussed, pt and wife wish to proceed  Cbc/bmet   2.  CHA2DS2VASc  score of 2(htn/age) Continue eliquis 5 mg bid  Emphasized compliance with taking drug with  cardioversion pending     3. HTN Stable today   4. Lack of PCP Encouraged to obtain a PCP  Return to afib clinic one week after cardioversion   Butch Penny C. Manasvini Whatley, [facility] [address] [place], [place] [address] [phone]

## 2022-07-20 NOTE — H&P (View-Only) (Signed)
 Primary Care Physician: Patient, No Pcp Per Referring Physician: ED f/u    [name] Le is a 72 y.o. male with h/o HTN, that presented to urgent care for symptoms of URI, and was found to have new onset afib, asymptomatic. He was also found to be hypertensive. He was discharged on 240 mg Cardizem and eliquis for a CHA2DS2VASc  score of 2.  He was asked to  f/u with afib clinic. EKG today shows afib rate controlled. He  feels that he may have been out of rhythm for several months.  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 He has noted sone dizziness working on a ladder so his boss has him working on jobs that do not require a ladder. He is taking the meds that were rx'ed thru the ED. He is rate controlled.   F/u in afib clinic, 06/23/22. He remains in rate controlled afib. We discussed pursing cardioversion and he is in agreement. Compliant with anticoagulation since 06/11/22.   F/u in afib clinic, 07/20/22. Pt is scheduled for cardioversion 3/21. It had to be delayed from  my visit on 2/14 for missed doses of DOAC. He has now not missed any doses for the last 3 weeks. His CV is scheduled for 3/21. He remains in rate controlled afib.   Today, he denies symptoms of palpitations, chest pain, shortness of breath, orthopnea, PND, lower extremity edema, dizziness, presyncope, syncope, or neurologic sequela. The patient is tolerating medications without difficulties and is otherwise without complaint today.   Past Medical History:  Diagnosis Date   Closed fracture of tuft of distal phalanx of finger 90's   left   Closed fracture of tuft of distal phalanx of right thumb with malunion May '14   ORIF with pin (Grammig)   ED (erectile dysfunction)    GERD (gastroesophageal reflux disease)    Lumbar degenerative disc disease    no problems that limit activity   MRSA (methicillin resistant staph aureus) culture positive 06/12/2012   left axilla   Past Surgical  History:  Procedure Laterality Date   LACERATION REPAIR Left    chain laceration left forearm - 13 stitches.   TONSILLECTOMY     tuft fracture Left may '14   ORIF with PIN ( Grammig)    Current Outpatient Medications  Medication Sig Dispense Refill   Anastrozole (ARIMIDEX PO) Take 0.5 tablets by mouth every 14 (fourteen) days.     apixaban (ELIQUIS) 5 MG TABS tablet Take 1 tablet (5 mg total) by mouth 2 (two) times daily. 60 tablet 3   diltiazem (CARDIZEM LA) 240 MG 24 hr tablet Take 1 tablet (240 mg total) by mouth daily. 30 tablet 3   famotidine (PEPCID) 20 MG tablet Take 2 tablets by mouth every morning.     fexofenadine (ALLEGRA) 180 MG tablet Take 180 mg by mouth as needed for allergies or rhinitis.     sildenafil (VIAGRA) 100 MG tablet Take 100 mg by mouth as needed.     No current facility-administered medications for this encounter.    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 Married '78, 1 dtr - '81, 1 son - '79, 1 grandchild. 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 Marriage in good health.   Social Determinants of Health   Financial Resource Strain: Not on file  Food Insecurity: Not on file  Transportation Needs: Not on file  Physical Activity: Not on file  Stress: Not on file  Social Connections: Not on file  Intimate Partner Violence: Not on  file    Family History  Problem Relation Age of Onset   Lung cancer Mother    Cancer Mother        lung with mets    ROS- All systems are reviewed and negative except as per the HPI above  Physical Exam: Vitals:   07/20/22 0829  BP: 118/82  Pulse: 80  Weight: 81.7 kg  Height: 5' 6" (1.676 m)   Wt Readings from Last 3 Encounters:  07/20/22 81.7 kg  06/23/22 81.8 kg  06/10/22 81.6 kg    Labs: Lab Results  Component Value Date   NA 139 05/31/2022   K 4.3 05/31/2022   CL 106 05/31/2022   CO2 24 05/31/2022   GLUCOSE 97 05/31/2022   BUN 20 05/31/2022   CREATININE 1.02 05/31/2022   CALCIUM 9.2 05/31/2022   No results found for: "INR" Lab Results  Component Value Date   CHOL 183 11/08/2016   HDL 52.40 11/08/2016   LDLCALC 114 (H) 11/08/2016   TRIG 82.0 11/08/2016     GEN- The patient is well appearing, alert and oriented x 3 today.   Head- normocephalic, atraumatic Eyes-  Sclera clear, conjunctiva pink Ears- hearing intact Oropharynx- clear Neck- supple, no JVP Lymph- no cervical lymphadenopathy Lungs- Clear to ausculation bilaterally, normal work of breathing Heart- irregular rate and rhythm, no murmurs, rubs or gallops, PMI not laterally displaced GI- soft, NT, ND, + BS Extremities- no clubbing, cyanosis, or edema MS- no significant deformity or atrophy Skin- no rash or lesion Psych- euthymic mood, full affect Neuro- strength and sensation are intact  EKG- Vent. rate 80 BPM PR interval * ms QRS duration 78 ms QT/QTcB 350/403 ms P-R-T axes * 2 55 Atrial fibrillation Anteroseptal infarct , age undetermined Abnormal ECG When compared with ECG of 23-Jun-2022 08:42, PREVIOUS ECG IS PRESENT    Assessment and Plan:  1. New onset persistent  Afib, dx  January 2024 Sleep study pending  Will order an echo when in SR  Continue Cardizem  240 mg daily  Scheduled for cardioversion, risk vrs benefit discussed, pt and wife wish to proceed  Cbc/bmet   2.  CHA2DS2VASc  score of 2(htn/age) Continue eliquis 5 mg bid  Emphasized compliance with taking drug with  cardioversion pending     3. HTN Stable today   4. Lack of PCP Encouraged to obtain a PCP  Return to afib clinic one week after cardioversion   Captain Blucher C. Sundance Moise, ANP-C Af[facility] [address] [place], [place] [address] [phone]  

## 2022-07-20 NOTE — Patient Instructions (Signed)
Cardioversion scheduled for: Thursday, March 21st               - Arrive at the [facility] and go to admitting at [place] not eat or drink anything after midnight the night prior to your procedure.               - Take all your morning medication (except diabetic medications) with a sip of water prior to arrival.   - You will not be able to drive home after your procedure.                - Do NOT miss any doses of your blood thinner - if you should miss a dose please notify our office immediately.               - If you feel as if you go back into normal rhythm prior to scheduled cardioversion, please notify our office immediately.    If your procedure is canceled in the cardioversion suite you will be charged a cancellation fee.     Follow up appointment - March 29th at 830am.

## 2022-07-21 ENCOUNTER — Other Ambulatory Visit (HOSPITAL_COMMUNITY): Payer: Self-pay | Admitting: *Deleted

## 2022-07-21 DIAGNOSIS — I4891 Unspecified atrial fibrillation: Secondary | ICD-10-CM

## 2022-07-27 ENCOUNTER — Ambulatory Visit: Payer: Medicare Other | Admitting: Nurse Practitioner

## 2022-07-28 NOTE — Anesthesia Preprocedure Evaluation (Signed)
Anesthesia Evaluation  Patient identified by MRN, date of birth, ID band Patient awake    Reviewed: Allergy & Precautions, NPO status , Patient's Chart, lab work & pertinent test results  Airway Mallampati: II  TM Distance: >3 FB Neck ROM: Full    Dental no notable dental hx. (+) Missing, Teeth Intact   Pulmonary    Pulmonary exam normal breath sounds clear to auscultation       Cardiovascular hypertension, + dysrhythmias Atrial Fibrillation  Rhythm:Regular Rate:Normal     Neuro/Psych    GI/Hepatic ,GERD  ,,  Endo/Other    Renal/GU Lab Results      Component                Value               Date                          K                        4.3                 07/20/2022                    Musculoskeletal  (+) Arthritis ,    Abdominal   Peds  Hematology   Anesthesia Other Findings   Reproductive/Obstetrics                             Anesthesia Physical Anesthesia Plan  ASA: 2  Anesthesia Plan: General   Post-op Pain Management:    Induction: Intravenous  PONV Risk Score and Plan: Treatment may vary due to age or medical condition  Airway Management Planned:   Additional Equipment: None  Intra-op Plan:   Post-operative Plan:   Informed Consent: I have reviewed the patients History and Physical, chart, labs and discussed the procedure including the risks, benefits and alternatives for the proposed anesthesia with the patient or authorized representative who has indicated his/her understanding and acceptance.     Dental advisory given  Plan Discussed with: CRNA and Anesthesiologist  Anesthesia Plan Comments:         Anesthesia Quick Evaluation

## 2022-07-29 ENCOUNTER — Ambulatory Visit (HOSPITAL_BASED_OUTPATIENT_CLINIC_OR_DEPARTMENT_OTHER): Payer: Medicare Other | Admitting: Anesthesiology

## 2022-07-29 ENCOUNTER — Ambulatory Visit (HOSPITAL_COMMUNITY): Payer: Medicare Other | Admitting: Anesthesiology

## 2022-07-29 ENCOUNTER — Encounter (HOSPITAL_COMMUNITY)
Admission: RE | Disposition: A | Discharge status: Home / Self Care | Payer: Self-pay | Source: Home / Self Care | Attending: Internal Medicine

## 2022-07-29 ENCOUNTER — Ambulatory Visit (HOSPITAL_COMMUNITY): Payer: Medicare Other | Admitting: Physician Assistant

## 2022-07-29 ENCOUNTER — Ambulatory Visit (HOSPITAL_COMMUNITY)
Admission: RE | Admit: 2022-07-29 | Discharge: 2022-07-29 | Disposition: A | Discharge status: Home / Self Care | Payer: Medicare Other | Attending: Internal Medicine | Admitting: Internal Medicine

## 2022-07-29 ENCOUNTER — Encounter (HOSPITAL_COMMUNITY): Payer: Self-pay | Admitting: Internal Medicine

## 2022-07-29 DIAGNOSIS — Z7901 Long term (current) use of anticoagulants: Secondary | ICD-10-CM | POA: Diagnosis not present

## 2022-07-29 DIAGNOSIS — I10 Essential (primary) hypertension: Secondary | ICD-10-CM | POA: Diagnosis not present

## 2022-07-29 DIAGNOSIS — Z79899 Other long term (current) drug therapy: Secondary | ICD-10-CM | POA: Diagnosis not present

## 2022-07-29 DIAGNOSIS — I4819 Other persistent atrial fibrillation: Secondary | ICD-10-CM | POA: Diagnosis present

## 2022-07-29 DIAGNOSIS — K219 Gastro-esophageal reflux disease without esophagitis: Secondary | ICD-10-CM | POA: Diagnosis not present

## 2022-07-29 DIAGNOSIS — M1990 Unspecified osteoarthritis, unspecified site: Secondary | ICD-10-CM | POA: Insufficient documentation

## 2022-07-29 DIAGNOSIS — I4891 Unspecified atrial fibrillation: Secondary | ICD-10-CM | POA: Diagnosis not present

## 2022-07-29 HISTORY — PX: CARDIOVERSION: SHX1299

## 2022-07-29 SURGERY — CARDIOVERSION
Anesthesia: General

## 2022-07-29 MED ORDER — PROPOFOL 10 MG/ML IV BOLUS
INTRAVENOUS | Status: DC | PRN
  Administered 2022-07-29: 80 mg via INTRAVENOUS

## 2022-07-29 MED ORDER — SODIUM CHLORIDE 0.9 % IV SOLN: INTRAVENOUS | Status: DC

## 2022-07-29 MED ORDER — LIDOCAINE 2% (20 MG/ML) 5 ML SYRINGE
INTRAMUSCULAR | Status: DC | PRN
  Administered 2022-07-29: 80 mg via INTRAVENOUS

## 2022-07-29 MED ORDER — SODIUM CHLORIDE 0.9 % IV SOLN
INTRAVENOUS | Status: DC | PRN
  Administered 2022-07-29: 500 mL via INTRAVENOUS

## 2022-07-29 NOTE — Anesthesia Postprocedure Evaluation (Signed)
Anesthesia Post Note  Patient: [name] Le  Procedure(s) Performed: CARDIOVERSION     Patient location during evaluation: Endoscopy Anesthesia Type: General Level of consciousness: awake and alert Pain management: pain level controlled Vital Signs Assessment: post-procedure vital signs reviewed and stable Respiratory status: spontaneous breathing, nonlabored ventilation, respiratory function stable and patient connected to nasal cannula oxygen Cardiovascular status: blood pressure returned to baseline and stable Postop Assessment: no apparent nausea or vomiting Anesthetic complications: no  No notable events documented.  Last Vitals:  Vitals:   07/29/22 0817 07/29/22 0854  BP: (!) 148/97 (!) 128/98  Pulse: 98 86  Resp: 16 17  Temp: 36.6 C 36.5 C  SpO2: 99% 96%    Last Pain:  Vitals:   07/29/22 0854  TempSrc: Tympanic  PainSc: Elizabeth

## 2022-07-29 NOTE — Anesthesia Procedure Notes (Signed)
Procedure Name: General with mask airway Date/Time: 07/29/2022 8:29 AM  Performed by: Harden Mo, CRNAPre-anesthesia Checklist: Patient identified, Emergency Drugs available, Suction available and Patient being monitored Patient Re-evaluated:Patient Re-evaluated prior to induction Oxygen Delivery Method: Ambu bag Preoxygenation: Pre-oxygenation with 100% oxygen Induction Type: IV induction Placement Confirmation: positive ETCO2 and breath sounds checked- equal and bilateral Dental Injury: Teeth and Oropharynx as per pre-operative assessment

## 2022-07-29 NOTE — CV Procedure (Addendum)
Procedure: Electrical Cardioversion Indications:  Atrial Fibrillation  Procedure Details:  Consent: Risks of procedure as well as the alternatives and risks of each were explained to the (patient/caregiver).  Consent for procedure obtained.  Time Out: Verified patient identification, verified procedure, site/side was marked, verified correct patient position, special equipment/implants available, medications/allergies/relevent history reviewed, required imaging and test results available. PERFORMED.  Patient placed on cardiac monitor, pulse oximetry, supplemental oxygen as necessary.  Sedation given:  propofol per anesthesia Pacer pads placed anterior and posterior chest.  Cardioverted 3 time(s).  Cardioversion with synchronized biphasic 120J, 200J, 200J shock. Unsuccessful cardioversion.  Evaluation: Findings: Post procedure EKG shows: Atrial Fibrillation Complications: None Patient did tolerate procedure well.  Time Spent Directly with the Patient:  30 minutes   [name] Le 07/29/2022, 8:52 AM

## 2022-07-29 NOTE — Discharge Instructions (Signed)

## 2022-07-29 NOTE — Interval H&P Note (Signed)
History and Physical Interval Note:  07/29/2022 8:08 AM  [name] Le  has presented today for surgery, with the diagnosis of AFIB.  The various methods of treatment have been discussed with the patient and family. After consideration of risks, benefits and other options for treatment, the patient has consented to  Procedure(s): CARDIOVERSION (N/A) as a surgical intervention.  The patient's history has been reviewed, patient examined, no change in status, stable for surgery.  I have reviewed the patient's chart and labs.  Questions were answered to the patient's satisfaction.     Elouise Munroe

## 2022-07-29 NOTE — Transfer of Care (Signed)
Immediate Anesthesia Transfer of Care Note  Patient: [name] Le  Procedure(s) Performed: CARDIOVERSION  Patient Location: Endoscopy Unit  Anesthesia Type:General  Level of Consciousness: awake and drowsy  Airway & Oxygen Therapy: Patient Spontanous Breathing  Post-op Assessment: Report given to RN, Post -op Vital signs reviewed and stable, and Patient moving all extremities X 4  Post vital signs: Reviewed and stable  Last Vitals:  Vitals Value Taken Time  BP 127/103   Temp    Pulse 90   Resp 17   SpO2 94     Last Pain:  Vitals:   07/29/22 0817  TempSrc: Tympanic  PainSc: 0-No pain         Complications: No notable events documented.

## 2022-07-30 ENCOUNTER — Encounter (HOSPITAL_COMMUNITY): Payer: Self-pay | Admitting: Internal Medicine

## 2022-08-06 ENCOUNTER — Encounter (HOSPITAL_COMMUNITY): Payer: Self-pay | Admitting: Nurse Practitioner

## 2022-08-06 ENCOUNTER — Ambulatory Visit (HOSPITAL_COMMUNITY)
Admission: RE | Admit: 2022-08-06 | Discharge: 2022-08-06 | Disposition: A | Discharge status: Home / Self Care | Payer: Medicare Other | Source: Ambulatory Visit | Attending: Nurse Practitioner | Admitting: Nurse Practitioner

## 2022-08-06 VITALS — BP 132/92 | HR 82 | Ht 69.5 in | Wt 178.2 lb

## 2022-08-06 DIAGNOSIS — I4891 Unspecified atrial fibrillation: Secondary | ICD-10-CM | POA: Diagnosis present

## 2022-08-06 DIAGNOSIS — Z79899 Other long term (current) drug therapy: Secondary | ICD-10-CM | POA: Insufficient documentation

## 2022-08-06 DIAGNOSIS — I10 Essential (primary) hypertension: Secondary | ICD-10-CM | POA: Diagnosis not present

## 2022-08-06 DIAGNOSIS — Z7901 Long term (current) use of anticoagulants: Secondary | ICD-10-CM | POA: Diagnosis not present

## 2022-08-06 NOTE — Progress Notes (Signed)
Primary Care Physician: Michela Pitcher, NP Referring Physician: ED f/u    [name] Le is a 72 y.o. male with h/o HTN, that presented to urgent care for symptoms of URI, and was found to have new onset afib, asymptomatic. He was also found to be hypertensive. He was discharged on 240 mg Cardizem and eliquis for a CHA2DS2VASc  score of 2.  He was asked to  f/u with afib clinic. EKG today shows afib rate controlled. He  feels that he may have been out of rhythm for several months.  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 He has noted sone dizziness working on a ladder so his boss has him working on jobs that do not require a Civil Service fast streamer. He is taking the meds that were rx'ed thru the ED. He is rate controlled.   F/u in afib clinic, 06/23/22. He remains in rate controlled afib. We discussed pursing cardioversion and he is in agreement. Compliant with anticoagulation since 06/11/22.   F/u in afib clinic, 07/20/22. Pt is scheduled for cardioversion 3/21. It had to be delayed from  my visit on 2/14 for missed doses of DOAC. He has now not missed any doses for the last 3 weeks. His CV is scheduled for 3/21. He remains in rate controlled afib.   F/u in afib clinic, 08/06/22. He had an unsuccessful cardioversion, 07/29/22. He remains  minimally  asymptomatic in rate controlled afib. We discussed means to restore SR.   Today, he denies symptoms of palpitations, chest pain, shortness of breath, orthopnea, PND, lower extremity edema, dizziness, presyncope, syncope, or neurologic sequela. The patient is tolerating medications without difficulties and is otherwise without complaint today.   Past Medical History:  Diagnosis Date   Closed fracture of tuft of distal phalanx of finger 90's   left   Closed fracture of tuft of distal phalanx of right thumb with malunion May '14   ORIF with pin Veronia Beets)   ED (erectile dysfunction)    GERD (gastroesophageal reflux disease)     Lumbar degenerative disc disease    no problems that limit activity   MRSA (methicillin resistant staph aureus) culture positive 06/12/2012   left axilla   Past Surgical History:  Procedure Laterality Date   CARDIOVERSION N/A 07/29/2022   Procedure: CARDIOVERSION;  Surgeon: Elouise Munroe, MD;  Location: [facility] ENDOSCOPY;  Service: Cardiovascular;  Laterality: N/A;   LACERATION REPAIR Left    chain laceration left forearm - 13 stitches.   TONSILLECTOMY     tuft fracture Left may '14   ORIF with PIN ( Grammig)    Current Outpatient Medications  Medication Sig Dispense Refill   anastrozole (ARIMIDEX) 1 MG tablet Take 0.5 tablets by mouth every 14 (fourteen) days.     apixaban (ELIQUIS) 5 MG TABS tablet Take 1 tablet (5 mg total) by mouth 2 (two) times daily. 60 tablet 3   diltiazem (CARDIZEM LA) 240 MG 24 hr tablet Take 1 tablet (240 mg total) by mouth daily. 30 tablet 3   famotidine (PEPCID) 20 MG tablet Take 20-40 mg by mouth daily with supper.     Polyvinyl Alcohol-Povidone (MURINE TEARS FOR DRY EYES) 5-6 MG/ML SOLN Place 1 drop into both eyes daily as needed (eye irritation).     sildenafil (VIAGRA) 100 MG tablet Take 100 mg by mouth as needed for erectile dysfunction.     No current facility-administered medications for this encounter.    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 Married  '78, 1 dtr - '81, 1 son - '79, 1 grandchild. Work - Oceanographer. Marriage in good health.   Social Determinants of Health   Financial Resource Strain: Not on file  Food Insecurity: Not on file  Transportation Needs: Not on file  Physical Activity: Not on file  Stress: Not on file  Social Connections: Not on file  Intimate Partner Violence: Not on file    Family History  Problem Relation Age of Onset   Lung cancer Mother    Cancer Mother        lung with mets    ROS- All systems are reviewed and negative except as per the HPI above  Physical Exam: There were no vitals filed for this visit.  Wt Readings from Last 3 Encounters:  07/29/22 81.6 kg  07/20/22 81.7 kg  06/23/22 81.8 kg    Labs: Lab Results  Component Value Date   NA 139 07/20/2022   K 4.3 07/20/2022   CL 104 07/20/2022   CO2 22 07/20/2022   GLUCOSE 104 (H) 07/20/2022   BUN 16 07/20/2022   CREATININE 1.41 (H) 07/20/2022   CALCIUM 9.1 07/20/2022   No results found for: "INR" Lab Results  Component Value Date   CHOL 183 11/08/2016   HDL 52.40 11/08/2016   LDLCALC 114 (H) 11/08/2016   TRIG 82.0 11/08/2016     GEN- The patient is well appearing, alert and oriented x 3 today.   Head- normocephalic, atraumatic Eyes-  Sclera clear, conjunctiva pink Ears- hearing intact Oropharynx- clear Neck- supple, no JVP Lymph- no cervical lymphadenopathy Lungs- Clear to ausculation bilaterally, normal work of breathing Heart- irregular rate and rhythm, no murmurs, rubs or gallops, PMI not laterally displaced GI- soft, NT, ND, + BS Extremities- no clubbing, cyanosis, or edema MS- no significant deformity or atrophy Skin- no rash or lesion Psych- euthymic mood, full affect Neuro- strength and sensation are intact  EKG-  Vent. rate 82 BPM PR interval * ms QRS duration 74 ms QT/QTcB 352/411 ms P-R-T axes * 24 51 Atrial fibrillation Possible Anteroseptal  infarct , age undetermined Abnormal ECG When compared with ECG of 20-Jul-2022 08:43, PREVIOUS ECG IS PRESENT    Assessment and Plan:  1. New onset persistent  Afib, dx  January 2024 Feel pt may have had afib longer than he appreciates  Failed cardioversion 07/29/22 Will obtain an echo and refer to EP to be considered for ablation  Sleep study pending  Continue Cardizem  240 mg daily   2. CHA2DS2VASc  score of 2(htn/age) Continue eliquis 5 mg bid  Emphasized compliance with taking drug    3. HTN Stable today   4. Lack of PCP Encouraged to obtain  a PCP  To EP  Afib clinic as needed   Butch Penny C. Ha Shannahan, [facility] [address] [place], [place] [address] [phone]

## 2022-08-13 ENCOUNTER — Ambulatory Visit (HOSPITAL_COMMUNITY)
Admission: RE | Admit: 2022-08-13 | Discharge: 2022-08-13 | Disposition: A | Discharge status: Home / Self Care | Payer: Medicare Other | Source: Ambulatory Visit | Attending: Nurse Practitioner | Admitting: Nurse Practitioner

## 2022-08-13 DIAGNOSIS — I10 Essential (primary) hypertension: Secondary | ICD-10-CM | POA: Insufficient documentation

## 2022-08-13 DIAGNOSIS — I4891 Unspecified atrial fibrillation: Secondary | ICD-10-CM | POA: Diagnosis not present

## 2022-08-13 DIAGNOSIS — I081 Rheumatic disorders of both mitral and tricuspid valves: Secondary | ICD-10-CM | POA: Insufficient documentation

## 2022-08-13 LAB — ECHOCARDIOGRAM COMPLETE
AR max vel: 3.25 cm2
AV Area VTI: 3.06 cm2
AV Area mean vel: 3.34 cm2
AV Mean grad: 3 mmHg
AV Peak grad: 4.7 mmHg
Ao pk vel: 1.08 m/s
Area-P 1/2: 5.66 cm2
Calc EF: 60.7 %
Est EF: 55
S' Lateral: 3.2 cm
Single Plane A2C EF: 56 %
Single Plane A4C EF: 65.8 %

## 2022-08-17 ENCOUNTER — Encounter (HOSPITAL_COMMUNITY): Payer: Self-pay | Admitting: *Deleted

## 2022-09-02 ENCOUNTER — Ambulatory Visit: Payer: Medicare Other | Attending: Cardiovascular Disease | Admitting: Cardiovascular Disease

## 2022-09-02 ENCOUNTER — Encounter: Payer: Self-pay | Admitting: Cardiovascular Disease

## 2022-09-02 VITALS — BP 130/68 | HR 67 | Ht 69.5 in | Wt 179.0 lb

## 2022-09-02 DIAGNOSIS — I4819 Other persistent atrial fibrillation: Secondary | ICD-10-CM | POA: Insufficient documentation

## 2022-09-02 MED ORDER — AMIODARONE HCL 200 MG PO TABS: 200.0000 mg | ORAL_TABLET | Freq: Every day | ORAL | 3 refills | Status: DC

## 2022-09-02 NOTE — Progress Notes (Signed)
Electrophysiology Office Note:    Date:  09/02/2022   ID:  Dhruva, Orndoff 1950-07-26, MRN [id number]  PCP:  Eden Emms, NP   [facility] Providers Cardiologist:  None Electrophysiologist:  Maurice Small, MD     Referring MD: Newman Nip, NP   History of Present Illness:    [name] Le is a 72 y.o. male with a hx listed below, significant for HTN, referred for arrhythmia management.  He presented to an urgent care with URI symptoms and found to be in atrial fibrillation. He was given cardizem and eliquis. He was seen at the AF clinic and found to be in rate controlled AF. He underwent cardioversion on 3/21 which was unsuccessful.  He did not have any obvious symptoms of AF when first diagnosed. His has noted an irregular rhythm when she put her head on his chest for about a year. He has not seen a doctor for about three years. The patient does note that he has had increasing fatigue over the past year. He does not have palpitations.  Past Medical History:  Diagnosis Date   Closed fracture of tuft of distal phalanx of finger 90's   left   Closed fracture of tuft of distal phalanx of right thumb with malunion May '14   ORIF with pin Butler Denmark)   ED (erectile dysfunction)    GERD (gastroesophageal reflux disease)    Lumbar degenerative disc disease    no problems that limit activity   MRSA (methicillin resistant staph aureus) culture positive 06/12/2012   left axilla    Past Surgical History:  Procedure Laterality Date   CARDIOVERSION N/A 07/29/2022   Procedure: CARDIOVERSION;  Surgeon: Parke Poisson, MD;  Location: [facility] ENDOSCOPY;  Service: Cardiovascular;  Laterality: N/A;   LACERATION REPAIR Left    chain laceration left forearm - 13 stitches.   TONSILLECTOMY     tuft fracture Left may '14   ORIF with PIN ( Grammig)    Current Medications: Current Meds  Medication Sig   anastrozole (ARIMIDEX) 1 MG tablet Take 0.5 tablets by mouth every 14  (fourteen) days.   diltiazem (CARDIZEM LA) 240 MG 24 hr tablet Take 1 tablet (240 mg total) by mouth daily.   famotidine (PEPCID) 20 MG tablet Take 20-40 mg by mouth daily with supper.   Polyvinyl Alcohol-Povidone (MURINE TEARS FOR DRY EYES) 5-6 MG/ML SOLN Place 1 drop into both eyes daily as needed (eye irritation).   sildenafil (VIAGRA) 100 MG tablet Take 100 mg by mouth as needed for erectile dysfunction.     Allergies:   Patient has no known allergies.   Social and Family History: Reviewed in Epic  ROS:   Please see the history of present illness.    All other systems reviewed and are negative.  EKGs/Labs/Other Studies Reviewed Today:    Echocardiogram:  TTE 08/13/2022 EF 55%, mildly dilated left atrium   Monitors:   Stress testing:   Advanced imaging:   EKG:  Last EKG results: today -- AF   Recent Labs: 05/31/2022: B Natriuretic Peptide 167.8 07/20/2022: BUN 16; Creatinine, Ser 1.41; Hemoglobin 18.6; Platelets 325; Potassium 4.3; Sodium 139     Physical Exam:    VS:  BP 130/68   Pulse 67   Ht 5' 9.5" (1.765 m)   Wt 179 lb (81.2 kg)   SpO2 97%   BMI 26.05 kg/m     Wt Readings from Last 3 Encounters:  09/02/22 179 lb (  81.2 kg)  08/06/22 178 lb 3.2 oz (80.8 kg)  07/29/22 180 lb (81.6 kg)     GEN:  Well nourished, well developed in no acute distress CARDIAC: iRRR, no murmurs, rubs, gallops RESPIRATORY:  Normal work of breathing MUSCULOSKELETAL: no edema    ASSESSMENT & PLAN:    Persistent atrial fibrillation Diagnosed January, 2024 -- likely present for significantly longer, possibly > 1 year He is not aware of any symptoms Rates are controlled AF is not very fine on ECG, and LA is only mildly dilated, so there is a possibility of rhythm control Will make an effort for trial of sinus rhythm -- start amiodarone  BID for 2 weeks, then 200 daily.  Plan DCCV for 3 weeks after his last missed dose of eliquis We discussed atrial fibrillation  ablation and will go ahead and schedule him for the procedure, pending outcome of the DCCV  We discussed the indication, rationale, logistics, anticipated benefits, and potential risks of the ablation procedure including but not limited to -- bleed at the groin access site, chest pain, damage to nearby organs such as the diaphragm, lungs, or esophagus, need for a drainage tube, or prolonged hospitalization. I explained that the risk for stroke, heart attack, need for open chest surgery, or even death is very low but not zero. he  expressed understanding and wishes to proceed.   Secondary hypercoagulable state Continue eliquis  BID            Medication Adjustments/Labs and Tests Ordered: Current medicines are reviewed at length with the patient today.  Concerns regarding medicines are outlined above.  No orders of the defined types were placed in this encounter.  No orders of the defined types were placed in this encounter.    Signed, Maurice Small, MD  09/02/2022 3:08 PM    [facility]

## 2022-09-02 NOTE — H&P (View-Only) (Signed)
Electrophysiology Office Note:    Date:  09/02/2022   ID:  [name] Le, DOB 06/22/1950, MRN [id number]  PCP:  Cable, James M, NP    [facility] Providers Cardiologist:  None Electrophysiologist:  Amzie Sillas E Jett Fukuda, MD     Referring MD: Carroll, Donna C, NP   History of Present Illness:    [name] Le is a 71 y.o. male with a hx listed below, significant for HTN, referred for arrhythmia management.  He presented to an urgent care with URI symptoms and found to be in atrial fibrillation. He was given cardizem and eliquis. He was seen at the AF clinic and found to be in rate controlled AF. He underwent cardioversion on 3/21 which was unsuccessful.  He did not have any obvious symptoms of AF when first diagnosed. His has noted an irregular rhythm when she put her head on his chest for about a year. He has not seen a doctor for about three years. The patient does note that he has had increasing fatigue over the past year. He does not have palpitations.  Past Medical History:  Diagnosis Date   Closed fracture of tuft of distal phalanx of finger 90's   left   Closed fracture of tuft of distal phalanx of right thumb with malunion May '14   ORIF with pin (Grammig)   ED (erectile dysfunction)    GERD (gastroesophageal reflux disease)    Lumbar degenerative disc disease    no problems that limit activity   MRSA (methicillin resistant staph aureus) culture positive 06/12/2012   left axilla    Past Surgical History:  Procedure Laterality Date   CARDIOVERSION N/A 07/29/2022   Procedure: CARDIOVERSION;  Surgeon: Acharya, Gayatri A, MD;  Location: [facility] ENDOSCOPY;  Service: Cardiovascular;  Laterality: N/A;   LACERATION REPAIR Left    chain laceration left forearm - 13 stitches.   TONSILLECTOMY     tuft fracture Left may '14   ORIF with PIN ( Grammig)    Current Medications: Current Meds  Medication Sig   anastrozole (ARIMIDEX) 1 MG tablet Take 0.5 tablets by mouth every 14  (fourteen) days.   diltiazem (CARDIZEM LA) 240 MG 24 hr tablet Take 1 tablet (240 mg total) by mouth daily.   famotidine (PEPCID) 20 MG tablet Take 20-40 mg by mouth daily with supper.   Polyvinyl Alcohol-Povidone (MURINE TEARS FOR DRY EYES) 5-6 MG/ML SOLN Place 1 drop into both eyes daily as needed (eye irritation).   sildenafil (VIAGRA) 100 MG tablet Take 100 mg by mouth as needed for erectile dysfunction.     Allergies:   Patient has no known allergies.   Social and Family History: Reviewed in Epic  ROS:   Please see the history of present illness.    All other systems reviewed and are negative.  EKGs/Labs/Other Studies Reviewed Today:    Echocardiogram:  TTE 08/13/2022 EF 55%, mildly dilated left atrium   Monitors:   Stress testing:   Advanced imaging:   EKG:  Last EKG results: today -- AF   Recent Labs: 05/31/2022: B Natriuretic Peptide 167.8 07/20/2022: BUN 16; Creatinine, Ser 1.41; Hemoglobin 18.6; Platelets 325; Potassium 4.3; Sodium 139     Physical Exam:    VS:  BP 130/68   Pulse 67   Ht 5' 9.5" (1.765 m)   Wt 179 lb (81.2 kg)   SpO2 97%   BMI 26.05 kg/m     Wt Readings from Last 3 Encounters:  09/02/22 179 lb (  81.2 kg)  08/06/22 178 lb 3.2 oz (80.8 kg)  07/29/22 180 lb (81.6 kg)     GEN:  Well nourished, well developed in no acute distress CARDIAC: iRRR, no murmurs, rubs, gallops RESPIRATORY:  Normal work of breathing MUSCULOSKELETAL: no edema    ASSESSMENT & PLAN:    Persistent atrial fibrillation Diagnosed January, 2024 -- likely present for significantly longer, possibly > 1 year He is not aware of any symptoms Rates are controlled AF is not very fine on ECG, and LA is only mildly dilated, so there is a possibility of rhythm control Will make an effort for trial of sinus rhythm -- start amiodarone 200mg BID for 2 weeks, then 200 daily.  Plan DCCV for 3 weeks after his last missed dose of eliquis We discussed atrial fibrillation  ablation and will go ahead and schedule him for the procedure, pending outcome of the DCCV  We discussed the indication, rationale, logistics, anticipated benefits, and potential risks of the ablation procedure including but not limited to -- bleed at the groin access site, chest pain, damage to nearby organs such as the diaphragm, lungs, or esophagus, need for a drainage tube, or prolonged hospitalization. I explained that the risk for stroke, heart attack, need for open chest surgery, or even death is very low but not zero. he  expressed understanding and wishes to proceed.   Secondary hypercoagulable state Continue eliquis 5mg BID            Medication Adjustments/Labs and Tests Ordered: Current medicines are reviewed at length with the patient today.  Concerns regarding medicines are outlined above.  No orders of the defined types were placed in this encounter.  No orders of the defined types were placed in this encounter.    Signed, Davanee Klinkner E Adaeze Better, MD  09/02/2022 3:08 PM    [facility] 

## 2022-09-02 NOTE — Patient Instructions (Addendum)
Medication Instructions:  Your physician has recommended you make the following change in your medication:  1) START taking amiodarone 200 mg twice daily for two weeks, then decrease to 200 mg once daily *If you need a refill on your cardiac medications before your next appointment, please call your pharmacy*  Testing/Procedures: Your physician has recommended that you have a Cardioversion (DCCV). Electrical Cardioversion uses a jolt of electricity to your heart either through paddles or wired patches attached to your chest. This is a controlled, usually prescheduled, procedure. Defibrillation is done under light anesthesia in the hospital, and you usually go home the day of the procedure. This is done to get your heart back into a normal rhythm. You are not awake for the procedure. Please see the instruction sheet given to you today.  Your physician has requested that you have cardiac CT. Cardiac computed tomography (CT) is a painless test that uses an x-ray machine to take clear, detailed pictures of your heart. For further information please visit [email or web address] Please follow instruction sheet as given. We will call you to schedule your CT scan. This will be done one week prior to your ablation.   Your physician has recommended that you have an ablation. Catheter ablation is a medical procedure used to treat some cardiac arrhythmias (irregular heartbeats). During catheter ablation, a long, thin, flexible tube is put into a blood vessel in your groin (upper thigh), or neck. This tube is called an ablation catheter. It is then guided to your heart through the blood vessel. Radio frequency waves destroy small areas of heart tissue where abnormal heartbeats may cause an arrhythmia to start. Please see the instruction sheet given to you today. You are scheduled for Atrial Fibrillation Ablation on Tuesday, October 29 with Dr. Halford Chessman.Please arrive at the Main Entrance A at [facility]: [address] [place], [place] [address] at 5:30 AM    Follow-Up: At [facility], you and your health needs are our priority.  As part of our continuing mission to provide you with exceptional heart care, we have created designated Provider Care Teams.  These Care Teams include your primary Cardiologist (physician) and Advanced Practice Providers (APPs -  Physician Assistants and Nurse Practitioners) who all work together to provide you with the care you need, when you need it.  Your next appointment:   1 month  Provider:   York Pellant, MD    Other Instructions   Dear [name] Le  You are scheduled for a Cardioversion on Thursday, May 16 with Dr. Eden Emms.  Please arrive at the [facility] (Main Entrance A) at [facility]: [address] [place], [place] [address] at 6:30 AM.  DIET:  Nothing to eat or drink after midnight except a sip of water with medications (see medication instructions below)  MEDICATION INSTRUCTIONS: !!IF ANY NEW MEDICATIONS ARE STARTED AFTER TODAY, PLEASE NOTIFY YOUR PROVIDER AS SOON AS POSSIBLE!!  FYI: Medications such as Semaglutide (Ozempic, [place]), Tirzepatide (Mounjaro, Zepbound), Dulaglutide (Trulicity), etc ("GLP1 agonists") must be held around the time of a procedure. Talk to your provider if you take one of these.  Continue taking your anticoagulant (blood thinner): Apixaban (Eliquis).  You will need to continue this after your procedure until you are told by your provider that it is safe to stop.    LABS: Your labs will be done at the hospital prior to your procedure - you will need to arrive 1 and 1/2 hours prior to your  procedure.  FYI:  For your safety, and to allow [place] to monitor your vital signs accurately during the surgery/procedure we request: If you have artificial nails, gel coating, SNS etc, please have those removed prior to your surgery/procedure. Not having the nail coverings /polish removed may result in cancellation or  delay of your surgery/procedure.  You must have a responsible person to drive you home and stay in the waiting area during your procedure. Failure to do so could result in cancellation.  Bring your insurance cards.  *Special Note: Every effort is made to have your procedure done on time. Occasionally there are emergencies that occur at the hospital that may cause delays. Please be patient if a delay does occur.

## 2022-09-10 ENCOUNTER — Ambulatory Visit (HOSPITAL_BASED_OUTPATIENT_CLINIC_OR_DEPARTMENT_OTHER): Payer: Medicare Other | Attending: Nurse Practitioner | Admitting: Cardiology

## 2022-09-10 VITALS — Ht 69.0 in | Wt 177.0 lb

## 2022-09-10 DIAGNOSIS — I480 Paroxysmal atrial fibrillation: Secondary | ICD-10-CM | POA: Diagnosis present

## 2022-09-10 DIAGNOSIS — I4891 Unspecified atrial fibrillation: Secondary | ICD-10-CM

## 2022-09-10 DIAGNOSIS — G4733 Obstructive sleep apnea (adult) (pediatric): Secondary | ICD-10-CM | POA: Diagnosis not present

## 2022-09-14 ENCOUNTER — Ambulatory Visit: Payer: Medicare Other | Admitting: Nurse Practitioner

## 2022-09-20 NOTE — Procedures (Addendum)
Patient Name: [name] [name] Date: 09/10/2022 Gender: Male D.O.B: 07/28/1950 Age (years): 71 Referring Provider: Newman Nip Height (inches): 69 Interpreting Physician: Armanda Magic MD, ABSM Weight (lbs): 177 RPSGT: Cherylann Parr BMI: 26 MRN: [id number] Neck Size: 16.50  CLINICAL INFORMATION Sleep Study Type: Split Night CPAP  Indication for sleep study: Fatigue, OSA, Snoring, Witnessed Apneas  Epworth Sleepiness Score: 8  SLEEP STUDY TECHNIQUE As per the AASM Manual for the Scoring of Sleep and Associated Events v2.3 (April 2016) with a hypopnea requiring 4% desaturations.  The channels recorded and monitored were frontal, central and occipital EEG, electrooculogram (EOG), submentalis EMG (chin), nasal and oral airflow, thoracic and abdominal wall motion, anterior tibialis EMG, snore microphone, electrocardiogram, and pulse oximetry. Continuous positive airway pressure (CPAP) was initiated when the patient met split night criteria and was titrated according to treat sleep-disordered breathing.  MEDICATIONS Medications self-administered by patient taken the night of the study : N/A  RESPIRATORY PARAMETERS Diagnostic Total AHI (/hr): 50.8  RDI (/hr):50.8  OA Index (/hr):32.1  CA Index (/hr): 11.8 REM AHI (/hr):18.3  NREM AHI (/hr):56.8  Supine AHI (/hr):50.8 Non-supine AHI (/hr): 0 Min O2 Sat (%):88.0  Mean O2 (%): 95.2  Time below 88% (min):0.1   Titration Optimal Pressure (cm):14  AHI at Optimal Pressure (/hr):N/A  Min O2 at Optimal Pressure (%):93.0 Supine % at Optimal (%):N/A  Sleep % at Optimal (%):N/A   SLEEP ARCHITECTURE The recording time for the entire night was 361.5 minutes.  During a baseline period of 185.6 minutes, the patient slept for 147.7 minutes in REM and nonREM, yielding a sleep efficiency of 79.6%. Sleep onset after lights out was 5.5 minutes with a REM latency of 55.0 minutes. The patient spent 2.4% of the night in stage N1 sleep,  82.1% in stage N2 sleep, 0.0% in stage N3 and 15.6% in REM.  During the titration period of 173.3 minutes, the patient slept for 128.0 minutes in REM and nonREM, yielding a sleep efficiency of 73.9%. Sleep onset after CPAP initiation was 2.2 minutes with a REM latency of 57.5 minutes. The patient spent 6.6% of the night in stage N1 sleep, 61.7% in stage N2 sleep, 0.0% in stage N3 and 31.6% in REM.  CARDIAC DATA The 2 lead EKG demonstrated atrial fibrillation. The mean heart rate was 100.0 beats per minute.  LEG MOVEMENT DATA The total Periodic Limb Movements of Sleep (PLMS) were 0. The PLMS index was 0.0 .  IMPRESSIONS - Severe obstructive sleep apnea occurred during the diagnostic portion of the study (AHI = 50.8/hour). An optimal PAP pressure was selected for this patient ( 14 cm of water) - Mild central sleep apnea occurred during the diagnostic portion of the study (CAI = 11.8/hour). - The patient had minimal or no oxygen desaturation during the diagnostic portion of the study (Min O2 = 88.0%) - No snoring was audible during the diagnostic portion of the study. - Atrial Fibrillation was noted during this study. - Clinically significant periodic limb movements did not occur during sleep.  DIAGNOSIS - Obstructive Sleep Apnea (G47.33)  RECOMMENDATIONS - Trial of ResMed CPAP therapy on 14 cm H2O with a Large size Fisher&Paykel Full Face Simplus mask and heated humidification. - Avoid alcohol, sedatives and other CNS depressants that may worsen sleep apnea and disrupt normal sleep architecture. - Sleep hygiene should be reviewed to assess factors that may improve sleep quality. - Weight management and regular exercise should be initiated or continued. - Return to Sleep Center  for re-evaluation after 4 weeks of therapy  [Electronically signed] 09/20/2022 09:52 AM  Armanda Magic MD, ABSM Diplomate, American Board of Sleep Medicine

## 2022-09-22 NOTE — Pre-Procedure Instructions (Signed)
Left voicemail for patient regarding procedure (cardioversion) - arrive at 0700, NPO after midnight, ensure ride home and responsible person to stay with patient for 24 hours after procedure, call cardiologist's office if missed any doses of blood thinner and continue taking it, take meds in AM with sip of water

## 2022-09-23 ENCOUNTER — Encounter (HOSPITAL_COMMUNITY)
Admission: RE | Disposition: A | Discharge status: Home / Self Care | Payer: Self-pay | Source: Ambulatory Visit | Attending: Cardiovascular Disease

## 2022-09-23 ENCOUNTER — Ambulatory Visit (HOSPITAL_COMMUNITY)
Admission: RE | Admit: 2022-09-23 | Discharge: 2022-09-23 | Disposition: A | Discharge status: Home / Self Care | Payer: Medicare Other | Source: Ambulatory Visit | Attending: Cardiovascular Disease | Admitting: Cardiovascular Disease

## 2022-09-23 ENCOUNTER — Encounter (HOSPITAL_COMMUNITY): Payer: Self-pay | Admitting: Cardiovascular Disease

## 2022-09-23 ENCOUNTER — Ambulatory Visit (HOSPITAL_COMMUNITY): Payer: Medicare Other | Admitting: Registered Nurse

## 2022-09-23 ENCOUNTER — Other Ambulatory Visit: Payer: Self-pay

## 2022-09-23 ENCOUNTER — Ambulatory Visit (HOSPITAL_BASED_OUTPATIENT_CLINIC_OR_DEPARTMENT_OTHER): Payer: Medicare Other | Admitting: Registered Nurse

## 2022-09-23 DIAGNOSIS — I4891 Unspecified atrial fibrillation: Secondary | ICD-10-CM | POA: Diagnosis not present

## 2022-09-23 DIAGNOSIS — Z7901 Long term (current) use of anticoagulants: Secondary | ICD-10-CM | POA: Diagnosis not present

## 2022-09-23 DIAGNOSIS — I10 Essential (primary) hypertension: Secondary | ICD-10-CM | POA: Diagnosis not present

## 2022-09-23 DIAGNOSIS — Z79899 Other long term (current) drug therapy: Secondary | ICD-10-CM | POA: Diagnosis not present

## 2022-09-23 DIAGNOSIS — N289 Disorder of kidney and ureter, unspecified: Secondary | ICD-10-CM | POA: Diagnosis not present

## 2022-09-23 DIAGNOSIS — D6869 Other thrombophilia: Secondary | ICD-10-CM | POA: Diagnosis not present

## 2022-09-23 DIAGNOSIS — I4819 Other persistent atrial fibrillation: Secondary | ICD-10-CM | POA: Diagnosis present

## 2022-09-23 DIAGNOSIS — I081 Rheumatic disorders of both mitral and tricuspid valves: Secondary | ICD-10-CM

## 2022-09-23 DIAGNOSIS — M1990 Unspecified osteoarthritis, unspecified site: Secondary | ICD-10-CM | POA: Diagnosis not present

## 2022-09-23 HISTORY — PX: CARDIOVERSION: SHX1299

## 2022-09-23 LAB — POCT I-STAT, CHEM 8
BUN: 26 mg/dL — ABNORMAL HIGH (ref 8–23)
Calcium, Ion: 1.2 mmol/L (ref 1.15–1.40)
Chloride: 107 mmol/L (ref 98–111)
Creatinine, Ser: 1.3 mg/dL — ABNORMAL HIGH (ref 0.61–1.24)
Glucose, Bld: 99 mg/dL (ref 70–99)
HCT: 58 % — ABNORMAL HIGH (ref 39.0–52.0)
Hemoglobin: 19.7 g/dL — ABNORMAL HIGH (ref 13.0–17.0)
Potassium: 5.5 mmol/L — ABNORMAL HIGH (ref 3.5–5.1)
Sodium: 139 mmol/L (ref 135–145)
TCO2: 23 mmol/L (ref 22–32)

## 2022-09-23 SURGERY — CARDIOVERSION
Anesthesia: General

## 2022-09-23 MED ORDER — SODIUM CHLORIDE 0.9 % IV SOLN: INTRAVENOUS | Status: DC | PRN

## 2022-09-23 MED ORDER — PROPOFOL 10 MG/ML IV BOLUS
INTRAVENOUS | Status: DC | PRN
  Administered 2022-09-23: 30 mg via INTRAVENOUS
  Administered 2022-09-23: 10 mg via INTRAVENOUS
  Administered 2022-09-23: 60 mg via INTRAVENOUS

## 2022-09-23 MED ORDER — SODIUM CHLORIDE 0.9 % IV SOLN: INTRAVENOUS | Status: DC

## 2022-09-23 SURGICAL SUPPLY — 1 items: ELECT DEFIB PAD ADLT CADENCE (PAD) ×1 IMPLANT

## 2022-09-23 NOTE — Progress Notes (Signed)
Per Dr. Eden Emms no post op EKG needed.

## 2022-09-23 NOTE — Progress Notes (Signed)
Dr. Eden Emms aware pt back in a.fib. no new orders

## 2022-09-23 NOTE — Transfer of Care (Signed)
Immediate Anesthesia Transfer of Care Note  Patient: [name] Le  Procedure(s) Performed: CARDIOVERSION  Patient Location: PACU and Cath Lab  Anesthesia Type:MAC  Level of Consciousness: drowsy and patient cooperative  Airway & Oxygen Therapy: Patient Spontanous Breathing  Post-op Assessment: Report given to RN and Post -op Vital signs reviewed and stable  Post vital signs: Reviewed and stable  Last Vitals:  Vitals Value Taken Time  BP    Temp    Pulse    Resp    SpO2      Last Pain:  Vitals:   09/23/22 0700  TempSrc: Temporal  PainSc: 0-No pain         Complications: There were no known notable events for this encounter.

## 2022-09-23 NOTE — CV Procedure (Signed)
DCC: On Rx DOAC no missed doses Anesthesia:  Dr Maple Hudson Propofol  DCC x 3 200J's biphasic  Each time converted to NSR for short period then reverted back to afib  No neurologic sequelae  F/U with Dr Nelly Laurence for planned ablation  Continue low dose amiodarone  Charlton Haws MD [facility]

## 2022-09-23 NOTE — Anesthesia Preprocedure Evaluation (Addendum)
Anesthesia Evaluation  Patient identified by MRN, date of birth, ID band Patient awake    Reviewed: Allergy & Precautions, NPO status , Patient's Chart, lab work & pertinent test results  History of Anesthesia Complications Negative for: history of anesthetic complications  Airway Mallampati: III  TM Distance: >3 FB Neck ROM: Full    Dental  (+) Dental Advisory Given, Teeth Intact,    Pulmonary neg pulmonary ROS   breath sounds clear to auscultation       Cardiovascular (-) angina (-) Past MI + dysrhythmias Atrial Fibrillation  Rhythm:Irregular  1. Left ventricular ejection fraction, by estimation, is 55% with beat to  beat variability. The left ventricle has normal function. The left  ventricle has no regional wall motion abnormalities. There is moderate  asymmetric left ventricular hypertrophy  of the septal segment. Left ventricular diastolic parameters are  indeterminate.   2. Right ventricular systolic function is mildly reduced. The right  ventricular size is normal. There is normal pulmonary artery systolic  pressure. The estimated right ventricular systolic pressure is 23.8 mmHg.   3. Left atrial size was mildly dilated.   4. Right atrial size was mildly dilated.   5. The mitral valve is grossly normal. Mild mitral valve regurgitation.  No evidence of mitral stenosis.   6. Tricuspid valve regurgitation is mild to moderate.   7. The aortic valve is tricuspid. Aortic valve regurgitation is not  visualized. No aortic stenosis is present.   8. The inferior vena cava is normal in size with greater than 50%  respiratory variability, suggesting right atrial pressure of 3 mmHg.     Neuro/Psych negative neurological ROS  negative psych ROS   GI/Hepatic Neg liver ROS,GERD  Medicated,,  Endo/Other  negative endocrine ROS    Renal/GU Renal InsufficiencyRenal diseaseLab Results      Component                Value                Date                      CREATININE               1.41 (H)            07/20/2022                Musculoskeletal  (+) Arthritis ,    Abdominal   Peds  Hematology  (+) Blood dyscrasia Lab Results      Component                Value               Date                      WBC                      7.1                 07/20/2022                HGB                      18.6 (H)            07/20/2022                HCT  55.3 (H)            07/20/2022                MCV                      89.5                07/20/2022                PLT                      325                 07/20/2022            eliquis   Anesthesia Other Findings   Reproductive/Obstetrics                             Anesthesia Physical Anesthesia Plan  ASA: 2  Anesthesia Plan: General   Post-op Pain Management: Minimal or no pain anticipated   Induction: Intravenous  PONV Risk Score and Plan: 2 and Treatment may vary due to age or medical condition  Airway Management Planned: Nasal Cannula, Natural Airway, Mask and Simple Face Mask  Additional Equipment: None  Intra-op Plan:   Post-operative Plan:   Informed Consent: I have reviewed the patients History and Physical, chart, labs and discussed the procedure including the risks, benefits and alternatives for the proposed anesthesia with the patient or authorized representative who has indicated his/her understanding and acceptance.     Dental advisory given  Plan Discussed with: CRNA  Anesthesia Plan Comments:         Anesthesia Quick Evaluation

## 2022-09-23 NOTE — Interval H&P Note (Signed)
History and Physical Interval Note:  09/23/2022 7:20 AM  [name] Le  has presented today for surgery, with the diagnosis of AFIB.  The various methods of treatment have been discussed with the patient and family. After consideration of risks, benefits and other options for treatment, the patient has consented to  Procedure(s): CARDIOVERSION (N/A) as a surgical intervention.  The patient's history has been reviewed, patient examined, no change in status, stable for surgery.  I have reviewed the patient's chart and labs.  Questions were answered to the patient's satisfaction.     Charlton Haws

## 2022-09-24 NOTE — Anesthesia Postprocedure Evaluation (Signed)
Anesthesia Post Note  Patient: [name] Le  Procedure(s) Performed: CARDIOVERSION     Patient location during evaluation: Cath Lab Anesthesia Type: General Level of consciousness: awake and alert Pain management: pain level controlled Vital Signs Assessment: post-procedure vital signs reviewed and stable Respiratory status: spontaneous breathing, nonlabored ventilation and respiratory function stable Cardiovascular status: stable Postop Assessment: no apparent nausea or vomiting Anesthetic complications: no   There were no known notable events for this encounter.  Last Vitals:  Vitals:   09/23/22 0820 09/23/22 0830  BP: (!) 129/98 (!) 125/104  Pulse: (!) 52 (!) 55  Resp: (!) 7 16  Temp:  36.6 C  SpO2: 98% 97%    Last Pain:  Vitals:   09/23/22 0830  TempSrc: Temporal  PainSc:                  Aneli Zara

## 2022-10-05 ENCOUNTER — Ambulatory Visit: Payer: Medicare Other | Attending: Cardiovascular Disease | Admitting: Cardiovascular Disease

## 2022-10-05 VITALS — BP 118/68 | HR 55 | Ht 69.0 in | Wt 178.8 lb

## 2022-10-05 DIAGNOSIS — I4891 Unspecified atrial fibrillation: Secondary | ICD-10-CM | POA: Insufficient documentation

## 2022-10-05 NOTE — Patient Instructions (Signed)
Medication Instructions:  STOP Amiodarone  *If you need a refill on your cardiac medications before your next appointment, please call your pharmacy*   Follow-Up: At [facility], you and your health needs are our priority.  As part of our continuing mission to provide you with exceptional heart care, we have created designated Provider Care Teams.  These Care Teams include your primary Cardiologist (physician) and Advanced Practice Providers (APPs -  Physician Assistants and Nurse Practitioners) who all work together to provide you with the care you need, when you need it.  We recommend signing up for the patient portal called "MyChart".  Sign up information is provided on this After Visit Summary.  MyChart is used to connect with patients for Virtual Visits (Telemedicine).  Patients are able to view lab/test results, encounter notes, upcoming appointments, etc.  Non-urgent messages can be sent to your provider as well.   To learn more about what you can do with MyChart, go to [email or web address].    Your next appointment:   October prior to ablation   Provider:   York Pellant, MD

## 2022-10-05 NOTE — Progress Notes (Signed)
Electrophysiology Office Note:    Date:  10/05/2022   ID:  [name], Le January 19, 1951, MRN [id number]  PCP:  Eden Emms, NP    HeartCare Providers Cardiologist:  None Electrophysiologist:  Maurice Small, MD     Referring MD: Eden Emms, NP   History of Present Illness:    [name] Le is a 72 y.o. male with a hx listed below, significant for HTN, referred for arrhythmia management.  He presented to an urgent care with URI symptoms and found to be in atrial fibrillation. He was given cardizem and eliquis. He was seen at the AF clinic and found to be in rate controlled AF. He underwent cardioversion on 3/21 which was unsuccessful.  He did not have any obvious symptoms of AF when first diagnosed. His has noted an irregular rhythm when she put her head on his chest for about a year. He has not seen a doctor for about three years. The patient does note that he has had increasing fatigue over the past year. He does not have palpitations.  He was loaded with amiodarone and referred for a DC cardioversion but he had recurrence of sinus rhythm within a few minutes despite three attempts. He notes daytime sleepiness and has a sleep study pending.  Past Medical History:  Diagnosis Date   Closed fracture of tuft of distal phalanx of finger 90's   left   Closed fracture of tuft of distal phalanx of right thumb with malunion May '14   ORIF with pin [place])   ED (erectile dysfunction)    GERD (gastroesophageal reflux disease)    Lumbar degenerative disc disease    no problems that limit activity   MRSA (methicillin resistant staph aureus) culture positive 06/12/2012   left axilla    Past Surgical History:  Procedure Laterality Date   CARDIOVERSION N/A 07/29/2022   Procedure: CARDIOVERSION;  Surgeon: [name] Poisson, MD;  Location: [facility] ENDOSCOPY;  Service: Cardiovascular;  Laterality: N/A;   CARDIOVERSION N/A 09/23/2022   Procedure: CARDIOVERSION;  Surgeon:  [name] Stade, MD;  Location: [facility] INVASIVE CV LAB;  Service: Cardiovascular;  Laterality: N/A;   LACERATION REPAIR Left    chain laceration left forearm - 13 stitches.   TONSILLECTOMY     tuft fracture Left may '14   ORIF with PIN ( [place])    Current Medications: Current Meds  Medication Sig   acetaminophen (TYLENOL) 500 MG tablet Take 500 mg by mouth every 6 (six) hours as needed (pain.). Tylenol Extra Strength Rapid Release Gels   amiodarone (PACERONE) 200 MG tablet Take 1 tablet (200 mg total) by mouth daily. Take 1 tablet (200 mg) twice daily for two weeks, then reduce to 1 tablet (200 mg) once daily   anastrozole (ARIMIDEX) 1 MG tablet Take 0.5 mg by mouth every 14 (fourteen) days.   apixaban (ELIQUIS) 5 MG TABS tablet Take 1 tablet (5 mg total) by mouth 2 (two) times daily.   diltiazem (CARDIZEM LA) 240 MG 24 hr tablet Take 1 tablet (240 mg total) by mouth daily.   famotidine (PEPCID) 20 MG tablet Take 40 mg by mouth every evening.   fexofenadine (ALLEGRA) 180 MG tablet Take 180 mg by mouth daily as needed for allergies.   Polyvinyl Alcohol-Povidone (MURINE TEARS FOR DRY EYES) 5-6 MG/ML SOLN Place 1 drop into both eyes 2 (two) times daily as needed (dry/irritated eyes.).   sildenafil (VIAGRA) 100 MG tablet Take 100 mg by mouth as  needed for erectile dysfunction.     Allergies:   Patient has no known allergies.   Social and Family History: Reviewed in Epic  ROS:   Please see the history of present illness.    All other systems reviewed and are negative.  EKGs/Labs/Other Studies Reviewed Today:    Echocardiogram:  TTE 08/13/2022 EF 55%, mildly dilated left atrium   Monitors:   Stress testing:   Advanced imaging:   EKG:  Last EKG results: today -- AF HR 55 bpm   Recent Labs: 05/31/2022: B Natriuretic Peptide 167.8 07/20/2022: Platelets 325 09/23/2022: BUN 26; Creatinine, Ser 1.30; Hemoglobin 19.7; Potassium 5.5; Sodium 139     Physical Exam:    VS:  BP  118/68   Pulse (!) 55   Ht 5\' 9"  (1.753 m)   Wt 178 lb 12.8 oz (81.1 kg)   SpO2 98%   BMI 26.40 kg/m     Wt Readings from Last 3 Encounters:  10/05/22 178 lb 12.8 oz (81.1 kg)  09/23/22 180 lb (81.6 kg)  09/10/22 177 lb (80.3 kg)     GEN:  Well nourished, well developed in no acute distress CARDIAC: iRRR, no murmurs, rubs, gallops RESPIRATORY:  Normal work of breathing MUSCULOSKELETAL: no edema    ASSESSMENT & PLAN:    Persistent atrial fibrillation Diagnosed January, 2024 -- likely present for significantly longer, possibly > 1 year He is not aware of any symptoms but does note fatigue and sleepiness Rates are controlled AF is not very fine on ECG, and LA is only mildly dilated, so there is a possibility of rhythm control He did not convert so sinus rhythm on amiodarone. We re-evaluated the risk/benefit of ablation. He would like to keep his ablation appointment. His wife appears to be more convinced than he is that he is symptomatic. -- will reassess again a month or so prior to the scheduled ablation date Discontinue amiodarone since he is not maintaining sinus rhythm   Secondary hypercoagulable state Continue eliquis 5mg  BID            Medication Adjustments/Labs and Tests Ordered: Current medicines are reviewed at length with the patient today.  Concerns regarding medicines are outlined above.  No orders of the defined types were placed in this encounter.  No orders of the defined types were placed in this encounter.    Signed, Maurice Small, MD  10/05/2022 4:31 PM    [facility]

## 2022-10-06 ENCOUNTER — Telehealth: Payer: Self-pay | Admitting: Cardiovascular Disease

## 2022-10-06 NOTE — Telephone Encounter (Signed)
Spoke with patient about wanting to get ablation moved forward, informed patient that unfortunately all of our current EP providers are scheduling late into the year. Patient willing to wait but wanted to check. If openings arise patient would like to be moved forward.

## 2022-10-06 NOTE — Telephone Encounter (Signed)
Patient wants to know if there is another provider than can do the ablation sooner than October.  Please advise.

## 2022-10-25 ENCOUNTER — Other Ambulatory Visit (HOSPITAL_COMMUNITY): Payer: Self-pay | Admitting: *Deleted

## 2022-10-25 MED ORDER — APIXABAN 5 MG PO TABS: 5.0000 mg | ORAL_TABLET | Freq: Two times a day (BID) | ORAL | 6 refills | Status: DC

## 2022-10-25 MED ORDER — DILTIAZEM HCL ER 240 MG PO TB24: 240.0000 mg | ORAL_TABLET | Freq: Every day | ORAL | 6 refills | Status: DC

## 2022-11-27 ENCOUNTER — Encounter (HOSPITAL_BASED_OUTPATIENT_CLINIC_OR_DEPARTMENT_OTHER): Payer: Self-pay

## 2022-11-27 ENCOUNTER — Emergency Department (HOSPITAL_BASED_OUTPATIENT_CLINIC_OR_DEPARTMENT_OTHER)
Admission: EM | Admit: 2022-11-27 | Discharge: 2022-11-27 | Disposition: A | Discharge status: Home / Self Care | Payer: Medicare Other | Attending: Emergency Medicine | Admitting: Emergency Medicine

## 2022-11-27 ENCOUNTER — Emergency Department (HOSPITAL_BASED_OUTPATIENT_CLINIC_OR_DEPARTMENT_OTHER): Payer: Medicare Other | Admitting: Radiology

## 2022-11-27 DIAGNOSIS — Z7901 Long term (current) use of anticoagulants: Secondary | ICD-10-CM | POA: Insufficient documentation

## 2022-11-27 DIAGNOSIS — M7052 Other bursitis of knee, left knee: Secondary | ICD-10-CM | POA: Diagnosis not present

## 2022-11-27 DIAGNOSIS — X503XXA Overexertion from repetitive movements, initial encounter: Secondary | ICD-10-CM | POA: Diagnosis not present

## 2022-11-27 DIAGNOSIS — Y9301 Activity, walking, marching and hiking: Secondary | ICD-10-CM | POA: Diagnosis not present

## 2022-11-27 DIAGNOSIS — Z79899 Other long term (current) drug therapy: Secondary | ICD-10-CM | POA: Insufficient documentation

## 2022-11-27 DIAGNOSIS — S8392XA Sprain of unspecified site of left knee, initial encounter: Secondary | ICD-10-CM | POA: Diagnosis not present

## 2022-11-27 DIAGNOSIS — S8992XA Unspecified injury of left lower leg, initial encounter: Secondary | ICD-10-CM | POA: Diagnosis present

## 2022-11-27 MED ORDER — ACETAMINOPHEN 325 MG PO TABS
650.0000 mg | ORAL_TABLET | Freq: Once | ORAL | Status: AC
  Administered 2022-11-27: 650 mg via ORAL

## 2022-11-27 MED ORDER — ACETAMINOPHEN 325 MG PO TABS
ORAL_TABLET | ORAL | Status: AC
  Filled 2022-11-27: qty 2

## 2022-11-27 NOTE — ED Provider Notes (Signed)
[name] Le AT [facility] Provider Note   CSN: [id number] Arrival date & time: 11/27/22  8469     History  Chief Complaint  Patient presents with   Knee Pain    [name] Le is a 72 y.o. male.  Patient is a 72 year old male with a past medical history of A-fib on Eliquis presenting to the emergency Le with left knee pain.  Patient states about 5 days ago he was walking down the steps when he felt a sharp pain in his knee.  He states that he did not fall at that time and was able to continue walking.  He states that throughout the week the pain has gotten worse especially when he is either sitting or standing for long periods of time.  He states that he has been taking Tylenol for pain with some relief.  He states that he started to notice increased swelling around his knee.  He denies any numbness or weakness.  He denies any prior injuries or surgeries to this knee.  The history is provided by the patient.  Knee Pain      Home Medications Prior to Admission medications   Medication Sig Start Date End Date Taking? Authorizing Provider  acetaminophen (TYLENOL) 500 MG tablet Take 500 mg by mouth every 6 (six) hours as needed (pain.). Tylenol Extra Strength Rapid Release Gels    [provider]  anastrozole (ARIMIDEX) 1 MG tablet Take 0.5 mg by mouth every 14 (fourteen) days.    [provider]  apixaban (ELIQUIS) 5 MG TABS tablet Take 1 tablet (5 mg total) by mouth 2 (two) times daily. 10/25/22 11/24/22  Mealor, Roberts Gaudy, MD  diltiazem (CARDIZEM LA) 240 MG 24 hr tablet Take 1 tablet (240 mg total) by mouth daily. 10/25/22   Mealor, Roberts Gaudy, MD  famotidine (PEPCID) 20 MG tablet Take 40 mg by mouth every evening.    [provider]  fexofenadine (ALLEGRA) 180 MG tablet Take 180 mg by mouth daily as needed for allergies.    [provider]  Polyvinyl Alcohol-Povidone (MURINE TEARS FOR DRY EYES) 5-6 MG/ML SOLN  Place 1 drop into both eyes 2 (two) times daily as needed (dry/irritated eyes.).    [provider]  sildenafil (VIAGRA) 100 MG tablet Take 100 mg by mouth as needed for erectile dysfunction. 04/19/22   [provider]      Allergies    Patient has no known allergies.    Review of Systems   Review of Systems  Physical Exam Updated Vital Signs BP (!) 150/108 (BP Location: Left Arm)   Pulse 79   Temp 98 F (36.7 C)   Resp 16   SpO2 96%  Physical Exam Vitals and nursing note reviewed.  Constitutional:      General: He is not in acute distress.    Appearance: Normal appearance.  HENT:     Head: Normocephalic.     Nose: Nose normal.  Eyes:     Extraocular Movements: Extraocular movements intact.  Cardiovascular:     Rate and Rhythm: Normal rate.     Pulses: Normal pulses.  Pulmonary:     Effort: Pulmonary effort is normal.  Musculoskeletal:        General: Normal range of motion.     Cervical back: Normal range of motion.     Comments: No bony tenderness to palpation of bilateral lower extremities, no knee joint laxity, palpable swelling to the infra patellar bursa, no  palpable knee joint effusion, no overlying skin changes  Skin:    General: Skin is warm and dry.  Neurological:     General: No focal deficit present.     Mental Status: He is alert and oriented to person, place, and time.  Psychiatric:        Mood and Affect: Mood normal.        Behavior: Behavior normal.     ED Results / Procedures / Treatments   Labs (all labs ordered are listed, but only abnormal results are displayed) Labs Reviewed - No data to display  EKG None  Radiology No results found.  Procedures Procedures    Medications Ordered in ED Medications  acetaminophen (TYLENOL) 325 MG tablet (has no administration in time range)  acetaminophen (TYLENOL) tablet 650 mg (650 mg Oral Given 11/27/22 2951)    ED Course/ Medical Decision Making/ A&P                              Medical Decision Making This patient presents to the ED with chief complaint(s) of knee pain with pertinent past medical history of A-fib on Eliquis which further complicates the presenting complaint. The complaint involves an extensive differential diagnosis and also carries with it a high risk of complications and morbidity.    The differential diagnosis includes knee sprain, bursitis, no erythema or warmth making a septic joint unlikely, no significant trauma or bony tenderness making fracture dislocation unlikely  Additional history obtained: Additional history obtained from N/A Records reviewed cardiology records  ED Course and Reassessment: On patient's arrival he is hemodynamically stable in no acute distress.  His lower extremity is neurovascularly intact.  He does have swelling to the left infrapatellar bursa with no overlying erythema or warmth or significant tenderness and no palpable knee joint laxity.  Exam appears consistent with a bursitis and he will be given an Ace wrap and Tylenol and recommended ice and heat.  He was recommended follow-up with his primary doctor and orthopedics as needed and was given strict return precautions.  Independent labs interpretation:  N/A  Independent visualization of imaging: - N/A  Consultation: - Consulted or discussed management/test interpretation w/ external professional: N/A  Consideration for admission or further workup: Patient has no emergent conditions requiring admission or further work-up at this time and is stable for discharge home with primary care follow-up  Social Determinants of health: N/A    Risk OTC drugs.          Final Clinical Impression(s) / ED Diagnoses Final diagnoses:  Infrapatellar bursitis of left knee  Sprain of left knee, unspecified ligament, initial encounter    Rx / DC Orders ED Discharge Orders     None         Rexford Maus, DO 11/27/22 [phone]

## 2022-11-27 NOTE — Discharge Instructions (Signed)
You were seen in the emergency department for your knee pain and your swelling.  This appeared to be consistent with bursitis or inflammation of one of the pads of your knee joint, you had no signs of infection or broken bones.  Have given you an Ace wrap to help with the swelling and to support your knee and you can continue to ice it or apply heat and keep your knee elevated to help with the swelling.  You can take Tylenol every 6 hours as needed for pain.  You can follow-up with your primary doctor or with orthopedics as needed if you are having continued pain and swelling.  You should avoid kneeling and putting pressure on your knee until it is healed.  You should return to the emergency department if you have redness of your knee, you are unable to bend your knee, you have fevers or if you have any other new or concerning symptoms.

## 2022-11-27 NOTE — ED Triage Notes (Signed)
He c/o left knee pain which began as he was descending stairs about 5 days ago.

## 2022-11-29 ENCOUNTER — Ambulatory Visit (INDEPENDENT_AMBULATORY_CARE_PROVIDER_SITE_OTHER): Payer: Medicare Other | Admitting: Student

## 2022-11-29 ENCOUNTER — Telehealth: Payer: Self-pay

## 2022-11-29 ENCOUNTER — Telehealth (HOSPITAL_BASED_OUTPATIENT_CLINIC_OR_DEPARTMENT_OTHER): Payer: Self-pay | Admitting: Student

## 2022-11-29 DIAGNOSIS — M7052 Other bursitis of knee, left knee: Secondary | ICD-10-CM

## 2022-11-29 NOTE — Telephone Encounter (Signed)
Patient wants to know if he should go back to work next week? Please call the advise [phone]

## 2022-11-29 NOTE — Transitions of Care (Post Inpatient/ED Visit) (Unsigned)
   11/29/2022  Name: [name] Le MRN: [id number] DOB: 03-23-1951  Today's TOC FU Call Status: Today's TOC FU Call Status:: Unsuccessul Call (1st Attempt) Unsuccessful Call (1st Attempt) Date: 11/29/22  Attempted to reach the patient regarding the most recent Inpatient/ED visit.  Follow Up Plan: Additional outreach attempts will be made to reach the patient to complete the Transitions of Care (Post Inpatient/ED visit) call.   Signature   Woodfin Ganja LPN [facility] Nurse Health Advisor Direct Dial [phone]

## 2022-11-30 DIAGNOSIS — M7052 Other bursitis of knee, left knee: Secondary | ICD-10-CM

## 2022-11-30 NOTE — Telephone Encounter (Signed)
Left message stating that he should be good to return to work next week

## 2022-11-30 NOTE — Progress Notes (Signed)
Chief Complaint: Left knee swelling     History of Present Illness:    [name] Le is a 72 y.o. male presenting for evaluation of swelling his left knee.  This began last week when he felt sharp pain in the knee while walking down the but with no known injury.  He was seen in the ED 2 days ago for this issue and was given an Ace wrap for compression as well as Tylenol for pain.  He reports that since then, the swelling over the front of his knee has gotten much worse.  His pain has continued however this has been mild and overall well-controlled.   Surgical History:   None  PMH/PSH/Family History/Social History/Meds/Allergies:    Past Medical History:  Diagnosis Date   Closed fracture of tuft of distal phalanx of finger 90's   left   Closed fracture of tuft of distal phalanx of right thumb with malunion May '14   ORIF with pin [place])   ED (erectile dysfunction)    GERD (gastroesophageal reflux disease)    Lumbar degenerative disc disease    no problems that limit activity   MRSA (methicillin resistant staph aureus) culture positive 06/12/2012   left axilla   Past Surgical History:  Procedure Laterality Date   CARDIOVERSION N/A 07/29/2022   Procedure: CARDIOVERSION;  Surgeon: Parke Poisson, MD;  Location: [facility] ENDOSCOPY;  Service: Cardiovascular;  Laterality: N/A;   CARDIOVERSION N/A 09/23/2022   Procedure: CARDIOVERSION;  Surgeon: Wendall Stade, MD;  Location: [facility] INVASIVE CV LAB;  Service: Cardiovascular;  Laterality: N/A;   LACERATION REPAIR Left    chain laceration left forearm - 13 stitches.   TONSILLECTOMY     tuft fracture Left may '14   ORIF with PIN ( Grammig)   Social History   Socioeconomic History   Marital status: Married    Spouse name: Not on file   Number of children: 2   Years of education: 12   Highest education level: Not on file  Occupational History   Occupation: hospital gas systems  Tobacco Use    Smoking status: Never   Smokeless tobacco: Never   Tobacco comments:    Never smoke 08/06/22  Vaping Use   Vaping status: Never Used  Substance and Sexual Activity   Alcohol use: Not Currently   Drug use: No   Sexual activity: Yes    Partners: Female  Other Topics Concern   Not on file  Social History Narrative   HSG, 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 Work - Psychiatrist. Marriage in good health.   Social Determinants of Health   Financial Resource Strain: Not on file  Food Insecurity: Not on file  Transportation Needs: Not on file  Physical Activity: Not on file  Stress: Not on file  Social Connections: Not on file   Family History  Problem Relation Age of Onset   Lung cancer Mother    Cancer Mother        lung with mets   No Known Allergies Current Outpatient Medications  Medication Sig Dispense Refill   acetaminophen (TYLENOL) 500 MG tablet Take 500 mg by mouth every 6 (six) hours as needed (pain.). Tylenol Extra  Strength Rapid Release Gels     anastrozole (ARIMIDEX) 1 MG tablet Take 0.5 mg by mouth every 14 (fourteen) days.     apixaban (ELIQUIS) 5 MG TABS tablet Take 1 tablet (5 mg total) by mouth 2 (two) times daily. 60 tablet 6   diltiazem (CARDIZEM LA) 240 MG 24 hr tablet Take 1 tablet (240 mg total) by mouth daily. 30 tablet 6   famotidine (PEPCID) 20 MG tablet Take 40 mg by mouth every evening.     fexofenadine (ALLEGRA) 180 MG tablet Take 180 mg by mouth daily as needed for allergies.     Polyvinyl Alcohol-Povidone (MURINE TEARS FOR DRY EYES) 5-6 MG/ML SOLN Place 1 drop into both eyes 2 (two) times daily as needed (dry/irritated eyes.).     sildenafil (VIAGRA) 100 MG tablet Take 100 mg by mouth as needed for erectile dysfunction.     No current facility-administered medications for this visit.   No results found.  Review of Systems:   A  ROS was performed including pertinent positives and negatives as documented in the HPI.  Physical Exam :   Constitutional: NAD and appears stated age Neurological: Alert and oriented Psych: Appropriate affect and cooperative There were no vitals taken for this visit.   Comprehensive Musculoskeletal Exam:    There is notable swelling over the anterior left knee, inferior to the patella that is fluctuant and mildly tender.  Active range of motion from 0 to 130 degrees.  No laxity with varus or valgus stress.  Negative Lachman.  Distal neurosensory exam intact.  Imaging:     Assessment:   72 y.o. male with anterior left knee swelling consistent with infrapatellar bursitis.  He has been using an Ace wrap for compression but this has continued to worsen over the past few days.  At this point, I do recommend aspiration with continued conservative management.  I was able to pull off 22 mL clear fluid without any complication.  Recommend continuing utilization of Ace wrap as well as ice.  Could also try topical Voltaren as an alternative to oral NSAIDs as he is on Eliquis.  Can return for repeat aspiration should fluid collection return.  Plan :    -Return to clinic as needed     Procedure Note  Patient: [name] Le             Date of Birth: November 02, 1950           MRN: [id number]             Visit Date: 11/29/2022  Procedures: Visit Diagnoses:  1. Infrapatellar bursitis of left knee     Large Joint Inj: L knee on 11/30/2022 7:32 PM Indications: joint swelling Details: 22 G 1.5 in needle, anterior approach Aspirate: 22 mL clear and serous Outcome: tolerated well, no immediate complications Consent was given by the patient. Immediately prior to procedure a time out was called to verify the correct patient, procedure, equipment, support staff and site/side marked as required. Patient was prepped and draped in the usual sterile fashion.      I personally saw and evaluated the  patient, and participated in the management and treatment plan.  Hazle Nordmann, PA-C Orthopedics  This document was dictated using Conservation officer, historic buildings. A reasonable attempt at proof reading has been made to minimize errors.

## 2022-11-30 NOTE — Transitions of Care (Post Inpatient/ED Visit) (Signed)
   11/30/2022  Name: [name] Le MRN: [id number] DOB: Aug 29, 1950  Today's TOC FU Call Status: Today's TOC FU Call Status:: Successful TOC FU Call Competed Unsuccessful Call (1st Attempt) Date: 11/29/22 Unsuccessful Call (2nd Attempt) Date: 11/30/22 [facility] FU Call Complete Date: 11/30/22  Attempted to reach the patient regarding the most recent Inpatient/ED visit.  Follow Up Plan: No further outreach attempts will be made at this time. We have been unable to contact the patient.  Signature   Woodfin Ganja LPN [facility] Nurse Health Advisor Direct Dial [phone]

## 2022-12-02 ENCOUNTER — Encounter (HOSPITAL_BASED_OUTPATIENT_CLINIC_OR_DEPARTMENT_OTHER): Payer: Self-pay | Admitting: Student

## 2022-12-02 ENCOUNTER — Ambulatory Visit (INDEPENDENT_AMBULATORY_CARE_PROVIDER_SITE_OTHER): Payer: Medicare Other | Admitting: Student

## 2022-12-02 DIAGNOSIS — M7052 Other bursitis of knee, left knee: Secondary | ICD-10-CM | POA: Diagnosis not present

## 2022-12-02 DIAGNOSIS — M25562 Pain in left knee: Secondary | ICD-10-CM

## 2022-12-02 NOTE — Progress Notes (Signed)
Chief Complaint: Left knee swelling     History of Present Illness:   12/02/22: [name] Le presents today for follow-up of his left knee.  He was seen in clinic 3 days ago for infrapatellar bursitis and I aspirated 22 mL of fluid.  By yesterday, the fluid had returned and he states that it is the same size as previous.  Still in little to no pain.  Has been keeping a compressive wrap on it and trying to limit activity although he has been walking some while on errands.  Has not tried applying Voltaren yet.  No fever or chills.    11/29/22: [name] Le is a 72 y.o. male presenting for evaluation of swelling his left knee.  This began last week when he felt sharp pain in the knee while walking down the but with no known injury.  He was seen in the ED 2 days ago for this issue and was given an Ace wrap for compression as well as Tylenol for pain.  He reports that since then, the swelling over the front of his knee has gotten much worse.  His pain has continued however this has been mild and overall well-controlled.   Surgical History:   None  PMH/PSH/Family History/Social History/Meds/Allergies:    Past Medical History:  Diagnosis Date   Closed fracture of tuft of distal phalanx of finger 90's   left   Closed fracture of tuft of distal phalanx of right thumb with malunion May '14   ORIF with pin [place])   ED (erectile dysfunction)    GERD (gastroesophageal reflux disease)    Lumbar degenerative disc disease    no problems that limit activity   MRSA (methicillin resistant staph aureus) culture positive 06/12/2012   left axilla   Past Surgical History:  Procedure Laterality Date   CARDIOVERSION N/A 07/29/2022   Procedure: CARDIOVERSION;  Surgeon: Parke Poisson, MD;  Location: [facility] ENDOSCOPY;  Service: Cardiovascular;  Laterality: N/A;   CARDIOVERSION N/A 09/23/2022   Procedure: CARDIOVERSION;  Surgeon: Wendall Stade, MD;  Location: [facility] INVASIVE CV LAB;   Service: Cardiovascular;  Laterality: N/A;   LACERATION REPAIR Left    chain laceration left forearm - 13 stitches.   TONSILLECTOMY     tuft fracture Left may '14   ORIF with PIN ( Grammig)   Social History   Socioeconomic History   Marital status: Married    Spouse name: Not on file   Number of children: 2   Years of education: 12   Highest education level: Not on file  Occupational History   Occupation: hospital gas systems  Tobacco Use   Smoking status: Never   Smokeless tobacco: Never   Tobacco comments:    Never smoke 08/06/22  Vaping Use   Vaping status: Never Used  Substance and Sexual Activity   Alcohol use: Not Currently   Drug use: No   Sexual activity: Yes    Partners: Female  Other Topics Concern   Not on file  Social History Narrative   HSG, 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 Work - Psychiatrist. Marriage in good health.   Social Determinants of Health   Financial Resource Strain: Not on file  Food Insecurity: Not on file  Transportation Needs: Not on file  Physical Activity: Not on file  Stress: Not on file  Social Connections: Not on file   Family History  Problem Relation Age of Onset   Lung cancer Mother    Cancer Mother        lung with mets   No Known Allergies Current Outpatient Medications  Medication Sig Dispense Refill   acetaminophen (TYLENOL) 500 MG tablet Take 500 mg by mouth every 6 (six) hours as needed (pain.). Tylenol Extra Strength Rapid Release Gels     anastrozole (ARIMIDEX) 1 MG tablet Take 0.5 mg by mouth every 14 (fourteen) days.     apixaban (ELIQUIS) 5 MG TABS tablet Take 1 tablet (5 mg total) by mouth 2 (two) times daily. 60 tablet 6   diltiazem (CARDIZEM LA) 240 MG 24 hr tablet Take 1 tablet (240 mg total) by mouth daily. 30 tablet 6   famotidine (PEPCID) 20 MG tablet Take 40 mg by mouth every  evening.     fexofenadine (ALLEGRA) 180 MG tablet Take 180 mg by mouth daily as needed for allergies.     Polyvinyl Alcohol-Povidone (MURINE TEARS FOR DRY EYES) 5-6 MG/ML SOLN Place 1 drop into both eyes 2 (two) times daily as needed (dry/irritated eyes.).     sildenafil (VIAGRA) 100 MG tablet Take 100 mg by mouth as needed for erectile dysfunction.     No current facility-administered medications for this visit.   No results found.  Review of Systems:   A ROS was performed including pertinent positives and negatives as documented in the HPI.  Physical Exam :   Constitutional: NAD and appears stated age Neurological: Alert and oriented Psych: Appropriate affect and cooperative There were no vitals taken for this visit.   Comprehensive Musculoskeletal Exam:    Fluctuant swelling over the infrapatellar bursa of the left knee.  No significant tenderness with palpation.  Area is not notably warm or erythematous.  Range of motion remains normal.  Neurosensory exam intact.   Imaging:     Assessment:   72 y.o. male with infrapatellar bursitis of the left knee.  This was drained a few days ago however fluid has returned.  I did aspirate this in clinic today under ultrasound guidance and removed 20 mL of bloody fluid.  I would like to send this off for Gram stain and culture to ensure there is no infective process.  Recommend continuing with compression and ice as well as topical NSAID.  Discussed that should the fluid return again, we could consider aspiration and cortisone injection pending negative results from culture.  Return to clinic as needed.  Plan :    -Infrapatellar aspiration performed today -Return to clinic as needed     Procedure Note  Patient: [name] Le             Date of Birth: [birth year]           MRN: [id number]             Visit Date: 12/02/2022  Procedures: Visit Diagnoses:  1. Infrapatellar bursitis of left knee     Large Joint Inj: L knee on 12/02/2022  5:00 PM Indications: joint swelling Details: 22 G 1.5 in needle, ultrasound-guided anterior approach Aspirate: bloody; sent for lab analysis Outcome: tolerated well, no immediate complications Consent was given by the patient. Immediately prior to procedure a time out was called to verify the correct patient, procedure, equipment, support staff and  site/side marked as required. Patient was prepped and draped in the usual sterile fashion.      I personally saw and evaluated the patient, and participated in the management and treatment plan.  Hazle Nordmann, PA-C Orthopedics  This document was dictated using Conservation officer, historic buildings. A reasonable attempt at proof reading has been made to minimize errors.

## 2022-12-03 ENCOUNTER — Telehealth: Payer: Self-pay | Admitting: Cardiovascular Disease

## 2022-12-03 NOTE — Telephone Encounter (Signed)
Pt states that he is having his procedure done at [facility] because they can get him in sooner.   He is aware that I have cancelled his pre-ablation appt with [facility] and his CT Scan.  He was advised to contact us with any further needs in the future.

## 2022-12-03 NOTE — Telephone Encounter (Signed)
Patient calling to cancel his ablation on 10/29. States that he found another facility that could get him in sooner. Please advise

## 2022-12-06 ENCOUNTER — Ambulatory Visit (INDEPENDENT_AMBULATORY_CARE_PROVIDER_SITE_OTHER): Payer: Medicare Other

## 2022-12-06 ENCOUNTER — Encounter (HOSPITAL_BASED_OUTPATIENT_CLINIC_OR_DEPARTMENT_OTHER): Payer: Self-pay | Admitting: Student

## 2022-12-06 ENCOUNTER — Ambulatory Visit (INDEPENDENT_AMBULATORY_CARE_PROVIDER_SITE_OTHER): Payer: Medicare Other | Admitting: Student

## 2022-12-06 DIAGNOSIS — M170 Bilateral primary osteoarthritis of knee: Secondary | ICD-10-CM

## 2022-12-06 DIAGNOSIS — M1711 Unilateral primary osteoarthritis, right knee: Secondary | ICD-10-CM | POA: Diagnosis not present

## 2022-12-06 DIAGNOSIS — G8929 Other chronic pain: Secondary | ICD-10-CM

## 2022-12-06 DIAGNOSIS — M25561 Pain in right knee: Secondary | ICD-10-CM | POA: Diagnosis not present

## 2022-12-06 DIAGNOSIS — M7052 Other bursitis of knee, left knee: Secondary | ICD-10-CM

## 2022-12-06 MED ORDER — LIDOCAINE HCL 1 % IJ SOLN
4.0000 mL | INTRAMUSCULAR | Status: AC | PRN
Start: 2022-12-06 — End: 2022-12-06
  Administered 2022-12-06: 4 mL

## 2022-12-06 MED ORDER — TRIAMCINOLONE ACETONIDE 40 MG/ML IJ SUSP
2.0000 mL | INTRAMUSCULAR | Status: AC | PRN
Start: 2022-12-06 — End: 2022-12-06
  Administered 2022-12-06: 2 mL via INTRA_ARTICULAR

## 2022-12-06 NOTE — Progress Notes (Signed)
Chief Complaint: Left knee swelling and right knee pain     History of Present Illness:   12/06/22: Patient presenting today for return of fluid in his left knee as well as right knee pain.  States that the front of his left knee had become fully swollen again 2 days after last aspiration.  This is now beginning to become more uncomfortable with a throbbing pain.  Has been using Voltaren 1-2 times daily for the past few days.  He also reports moderate pain in his right knee which began about a years ago after a fall while at work.  Pain is located on the medial aspect of the knee.  Denies any treatments for this.   12/02/22: Jontae presents today for follow-up of his left knee.  He was seen in clinic 3 days ago for infrapatellar bursitis and I aspirated 22 mL of fluid.  By yesterday, the fluid had returned and he states that it is the same size as previous.  Still in little to no pain.  Has been keeping a compressive wrap on it and trying to limit activity although he has been walking some while on errands.  Has not tried applying Voltaren yet.  No fever or chills.    Surgical History:   None  PMH/PSH/Family History/Social History/Meds/Allergies:    Past Medical History:  Diagnosis Date   Closed fracture of tuft of distal phalanx of finger 90's   left   Closed fracture of tuft of distal phalanx of right thumb with malunion May '14   ORIF with pin [place])   ED (erectile dysfunction)    GERD (gastroesophageal reflux disease)    Lumbar degenerative disc disease    no problems that limit activity   MRSA (methicillin resistant staph aureus) culture positive 06/12/2012   left axilla   Past Surgical History:  Procedure Laterality Date   CARDIOVERSION N/A 07/29/2022   Procedure: CARDIOVERSION;  Surgeon: Parke Poisson, MD;  Location: [facility] ENDOSCOPY;  Service: Cardiovascular;  Laterality: N/A;   CARDIOVERSION N/A 09/23/2022   Procedure: CARDIOVERSION;   Surgeon: Wendall Stade, MD;  Location: [facility] INVASIVE CV LAB;  Service: Cardiovascular;  Laterality: N/A;   LACERATION REPAIR Left    chain laceration left forearm - 13 stitches.   TONSILLECTOMY     tuft fracture Left may '14   ORIF with PIN ( Grammig)   Social History   Socioeconomic History   Marital status: Married    Spouse name: Not on file   Number of children: 2   Years of education: 12   Highest education level: Not on file  Occupational History   Occupation: hospital gas systems  Tobacco Use   Smoking status: Never   Smokeless tobacco: Never   Tobacco comments:    Never smoke 08/06/22  Vaping Use   Vaping status: Never Used  Substance and Sexual Activity   Alcohol use: Not Currently   Drug use: No   Sexual activity: Yes    Partners: Female  Other Topics Concern   Not on file  Social History Narrative   HSG, 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 Work - Psychiatrist. Marriage in good health.   Social Determinants of Health  Financial Resource Strain: Not on file  Food Insecurity: Not on file  Transportation Needs: Not on file  Physical Activity: Not on file  Stress: Not on file  Social Connections: Not on file   Family History  Problem Relation Age of Onset   Lung cancer Mother    Cancer Mother        lung with mets   No Known Allergies Current Outpatient Medications  Medication Sig Dispense Refill   acetaminophen (TYLENOL) 500 MG tablet Take 500 mg by mouth every 6 (six) hours as needed (pain.). Tylenol Extra Strength Rapid Release Gels     anastrozole (ARIMIDEX) 1 MG tablet Take 0.5 mg by mouth every 14 (fourteen) days.     apixaban (ELIQUIS) 5 MG TABS tablet Take 1 tablet (5 mg total) by mouth 2 (two) times daily. 60 tablet 6   diltiazem (CARDIZEM LA) 240 MG 24 hr tablet Take 1 tablet (240 mg total) by mouth daily. 30 tablet 6    famotidine (PEPCID) 20 MG tablet Take 40 mg by mouth every evening.     fexofenadine (ALLEGRA) 180 MG tablet Take 180 mg by mouth daily as needed for allergies.     Polyvinyl Alcohol-Povidone (MURINE TEARS FOR DRY EYES) 5-6 MG/ML SOLN Place 1 drop into both eyes 2 (two) times daily as needed (dry/irritated eyes.).     sildenafil (VIAGRA) 100 MG tablet Take 100 mg by mouth as needed for erectile dysfunction.     No current facility-administered medications for this visit.   No results found.  Review of Systems:   A ROS was performed including pertinent positives and negatives as documented in the HPI.  Physical Exam :   Constitutional: NAD and appears stated age Neurological: Alert and oriented Psych: Appropriate affect and cooperative There were no vitals taken for this visit.   Comprehensive Musculoskeletal Exam:    Left knee appears swollen over the infrapatellar bursa.  This is fluctuant but not warm or erythematous.  Right knee is tender over the medial joint line.  Active range of motion from 0 to 120 degrees with moderate crepitus.  No instability with varus or valgus stress.  Negative Lachman.  Minimal effusion present.   Imaging:   Xray (left knee 4 views, right knee 4 views) Moderate osteoarthritis of bilateral medial compartments that is nearing bone-on-bone in the right knee.  Mild to moderate patellofemoral arthritis noted bilaterally.  Chondrocalcinosis noted in bilateral joint spaces.    I personally reviewed and interpreted the radiographs.  Assessment:   72 y.o. male with recurrent infrapatellar bursitis of the left knee.  This has been drained twice at this point and the swelling has been returning in 2 to 3 days.  I did discuss with him that at this point I would consider a cortisone injection of the bursa after aspiration, however I would want to ensure the absence of any infection.  We did send off the fluid from 7/25 for culture and Gram stain, however I have not  received the results at this point.  I did aspirate 22 mL of bloody fluid.  I do have low suspicion for infection, as appearance is likely due to anticoagulation, but recommend that we wait for results before proceeding with injection.  X-rays taken today do show moderate osteoarthritis that is worse in both medial compartments as well as bilateral chondrocalcinosis suggesting pseudogout.  Since his right knee has been bothering him lately, I did offer an injection which was performed today under ultrasound  guidance after patient consent.  Will plan to assess relief with this and follow-up as needed for potential aspiration and injection of the left infrapatellar bursa before considering possible bursectomy.   Plan :    -Left knee infrapatellar bursa aspirated today -Ultrasound-guided cortisone injection performed of the right knee -Return to clinic as needed     Procedure Note  Patient: [name] Le             Date of Birth: [birth year]           MRN: [id number]             Visit Date: 12/06/2022  Procedures: Visit Diagnoses:  1. Infrapatellar bursitis of left knee   2. Bilateral primary osteoarthritis of knee     Large Joint Inj: R knee on 12/06/2022 4:14 PM Indications: pain Details: 22 G 1.5 in needle, ultrasound-guided anterolateral approach Medications: 4 mL lidocaine 1 %; 2 mL triamcinolone acetonide 40 MG/ML Outcome: tolerated well, no immediate complications Consent was given by the patient. Immediately prior to procedure a time out was called to verify the correct patient, procedure, equipment, support staff and site/side marked as required. Patient was prepped and draped in the usual sterile fashion.    Large Joint Inj: L knee on 12/06/2022 4:14 PM Indications: joint swelling and pain Details: 22 G 1.5 in needle, medial approach Aspirate: 22 mL bloody Outcome: tolerated well, no immediate complications Procedure, treatment alternatives, risks and benefits explained,  specific risks discussed. Consent was given by the patient. Patient was prepped and draped in the usual sterile fashion.      I personally saw and evaluated the patient, and participated in the management and treatment plan.  Hazle Nordmann, PA-C Orthopedics  This document was dictated using Conservation officer, historic buildings. A reasonable attempt at proof reading has been made to minimize errors.

## 2022-12-10 ENCOUNTER — Ambulatory Visit (INDEPENDENT_AMBULATORY_CARE_PROVIDER_SITE_OTHER): Payer: Medicare Other | Admitting: Student

## 2022-12-10 ENCOUNTER — Encounter (HOSPITAL_BASED_OUTPATIENT_CLINIC_OR_DEPARTMENT_OTHER): Payer: Self-pay | Admitting: Student

## 2022-12-10 DIAGNOSIS — M25562 Pain in left knee: Secondary | ICD-10-CM | POA: Diagnosis not present

## 2022-12-10 DIAGNOSIS — M7052 Other bursitis of knee, left knee: Secondary | ICD-10-CM | POA: Diagnosis not present

## 2022-12-10 NOTE — Progress Notes (Signed)
Chief Complaint: Left knee swelling     History of Present Illness:   12/10/22: [name] Le is here today for repeat aspiration and possible cortisone injection of the left knee.  He is continuing use of Ace wrap for compression.  Did get good relief after last aspiration, however this only lasted a few days.  Has remained out of work and has been trying to rest when possible.  Reports that his right knee is feeling significantly better after injection last week.    12/06/22: Patient presenting today for return of fluid in his left knee as well as right knee pain.  States that the front of his left knee had become fully swollen again 2 days after last aspiration.  This is now beginning to become more uncomfortable with a throbbing pain.  Has been using Voltaren 1-2 times daily for the past few days.  He also reports moderate pain in his right knee which began about a years ago after a fall while at work.  Pain is located on the medial aspect of the knee.  Denies any treatments for this.   Surgical History:   None  PMH/PSH/Family History/Social History/Meds/Allergies:    Past Medical History:  Diagnosis Date   Closed fracture of tuft of distal phalanx of finger 90's   left   Closed fracture of tuft of distal phalanx of right thumb with malunion May '14   ORIF with pin Butler [place])   ED (erectile dysfunction)    GERD (gastroesophageal reflux disease)    Lumbar degenerative disc disease    no problems that limit activity   MRSA (methicillin resistant staph aureus) culture positive 06/12/2012   left axilla   Past Surgical History:  Procedure Laterality Date   CARDIOVERSION N/A 07/29/2022   Procedure: CARDIOVERSION;  Surgeon: Parke Poisson, MD;  Location: [facility] ENDOSCOPY;  Service: Cardiovascular;  Laterality: N/A;   CARDIOVERSION N/A 09/23/2022   Procedure: CARDIOVERSION;  Surgeon: Wendall Stade, MD;  Location: [facility] INVASIVE CV LAB;  Service: Cardiovascular;   Laterality: N/A;   LACERATION REPAIR Left    chain laceration left forearm - 13 stitches.   TONSILLECTOMY     tuft fracture Left may '14   ORIF with PIN ( Grammig)   Social History   Socioeconomic History   Marital status: Married    Spouse name: Not on file   Number of children: 2   Years of education: 12   Highest education level: Not on file  Occupational History   Occupation: hospital gas systems  Tobacco Use   Smoking status: Never   Smokeless tobacco: Never   Tobacco comments:    Never smoke 08/06/22  Vaping Use   Vaping status: Never Used  Substance and Sexual Activity   Alcohol use: Not Currently   Drug use: No   Sexual activity: Yes    Partners: Female  Other Topics Concern   Not on file  Social History Narrative   HSG, 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 Work - Psychiatrist. Marriage in good health.   Social Determinants of Health   Financial Resource Strain: Not on file  Food Insecurity: Not on file  Transportation Needs: Not on file  Physical Activity: Not  on file  Stress: Not on file  Social Connections: Not on file   Family History  Problem Relation Age of Onset   Lung cancer Mother    Cancer Mother        lung with mets   No Known Allergies Current Outpatient Medications  Medication Sig Dispense Refill   acetaminophen (TYLENOL) 500 MG tablet Take 500 mg by mouth every 6 (six) hours as needed (pain.). Tylenol Extra Strength Rapid Release Gels     anastrozole (ARIMIDEX) 1 MG tablet Take 0.5 mg by mouth every 14 (fourteen) days.     apixaban (ELIQUIS) 5 MG TABS tablet Take 1 tablet (5 mg total) by mouth 2 (two) times daily. 60 tablet 6   diltiazem (CARDIZEM LA) 240 MG 24 hr tablet Take 1 tablet (240 mg total) by mouth daily. 30 tablet 6   famotidine (PEPCID) 20 MG tablet Take 40 mg by mouth every evening.     fexofenadine  (ALLEGRA) 180 MG tablet Take 180 mg by mouth daily as needed for allergies.     Polyvinyl Alcohol-Povidone (MURINE TEARS FOR DRY EYES) 5-6 MG/ML SOLN Place 1 drop into both eyes 2 (two) times daily as needed (dry/irritated eyes.).     sildenafil (VIAGRA) 100 MG tablet Take 100 mg by mouth as needed for erectile dysfunction.     No current facility-administered medications for this visit.   No results found.  Review of Systems:   A ROS was performed including pertinent positives and negatives as documented in the HPI.  Physical Exam :   Constitutional: NAD and appears stated age Neurological: Alert and oriented Psych: Appropriate affect and cooperative There were no vitals taken for this visit.   Comprehensive Musculoskeletal Exam:    Left knee appears swollen over the infrapatellar bursa.  This is fluctuant but not warm or erythematous.  Active range of motion from 0 to 120 degrees with moderate crepitus.   Imaging:     Assessment:   72 y.o. male with recurrent infrapatellar bursitis of the left knee.  Fluid continues to return within 2 to 3 days of aspiration.  Culture results received a few days ago showed no growth.  Plan for possible cortisone injection today, however after consultation with Dr. Steward Drone, we are likely considering moving forward with arthroscopic bursectomy as symptoms have failed to improve with conservative management.  To give flexibility to have this done within the next few weeks, we will hold off on injection today.  I was able to aspirate out 13 mL of blood-tinged fluid from the anterior knee.  Patient will consult with PCP and cardiologist before proceeding, particularly with upcoming cardiac ablation procedure scheduled in September.   Plan :    -Left knee infrapatellar bursa aspirated today -Consider left knee bursectomy with Dr. Steward Drone      Procedure Note  Patient: [name] Le             Date of Birth: [birth year]           MRN:  [id number]             Visit Date: 12/10/2022  Procedures: Visit Diagnoses:  1. Infrapatellar bursitis of left knee      Large Joint Inj: L knee on 12/10/2022 5:15 PM Indications: pain Details: 22 G 1.5 in needle, anterior approach Outcome: tolerated well, no immediate complications Consent was given by the patient. Immediately prior to procedure a time out was called to verify the correct patient, procedure, equipment, support  staff and site/side marked as required. Patient was prepped and draped in the usual sterile fashion.      I personally saw and evaluated the patient, and participated in the management and treatment plan.  Hazle Nordmann, PA-C Orthopedics  This document was dictated using Conservation officer, historic buildings. A reasonable attempt at proof reading has been made to minimize errors.

## 2022-12-14 ENCOUNTER — Telehealth (HOSPITAL_BASED_OUTPATIENT_CLINIC_OR_DEPARTMENT_OTHER): Payer: Self-pay | Admitting: Student

## 2022-12-14 NOTE — Telephone Encounter (Signed)
Patient wants to discuss procedure  that was talked about at his last visit

## 2022-12-15 NOTE — Telephone Encounter (Signed)
Surgery sheet sent to April Beavers

## 2022-12-16 ENCOUNTER — Telehealth: Payer: Self-pay | Admitting: *Deleted

## 2022-12-16 NOTE — Telephone Encounter (Signed)
   Pre-operative Risk Assessment    Patient Name: [name] Le  DOB: [birth year] MRN: [id number]      Request for Surgical Clearance    Procedure:   LEFT KNEE PATELLAR BUNSECTOMY WITH IRRIGATION AND DEBRIDEMENT  Date of Surgery:  Clearance TBD                                 Surgeon: DR. Huel Cote  Surgeon's Group or Practice Name:  [facility] Phone number:  [phone] Fax number:  [phone] ATTN: APRIL    Type of Clearance Requested:   - Medical  - Pharmacy:  Hold Apixaban (Eliquis) x 2 DAYS PRIOR   Type of Anesthesia:  General    Additional requests/questions:    [name]   12/16/2022, 4:32 PM

## 2022-12-17 NOTE — Telephone Encounter (Signed)
Pharmacy please advise on holding Apixaban prior to  LEFT KNEE PATELLAR BUNSECTOMY WITH IRRIGATION AND DEBRIDEMENT  scheduled for TBD. Thank you.

## 2022-12-17 NOTE — Telephone Encounter (Signed)
   Name: [name] Le  DOB: 1951/02/07  MRN: [id number]  Primary Cardiologist: Maurice Small, MD   Preoperative team, please contact this patient and set up a phone call appointment for further preoperative risk assessment. Please obtain consent and complete medication review. Thank you for your help.  Last seen on 10/05/2022  I confirm that guidance regarding antiplatelet and oral anticoagulation therapy has been completed and, if necessary, noted below   Per Pharmacy  Was pending afib ablation on 03/08/23 but this looks to have been canceled. However, note from [facility] on 8/5 mentions he didn't want to do anything that would affect ablation so I'm not sure about timing of that. Same note mentions pt would need only 1 day Eliquis hold prior to procedure. Per office protocol, patient can hold Eliquis for 1 day prior to procedure.     Joni Reining, NP 12/17/2022, 2:32 PM [facility]

## 2022-12-17 NOTE — Telephone Encounter (Signed)
Patient with diagnosis of afib on Eliquis for anticoagulation.    Procedure: left knee patellar bunsectomy with irrigation and debridement Date of procedure: TBD  CHA2DS2-VASc Score = 2  This indicates a 2.2% annual risk of stroke. The patient's score is based upon: CHF History: 0 HTN History: 1 Diabetes History: 0 Stroke History: 0 Vascular Disease History: 0 Age Score: 1 Gender Score: 0   CrCl 88mL/min Platelet count 325K  Was pending afib ablation on 03/08/23 but this looks to have been canceled. However, note from [facility] on 8/5 mentions he didn't want to do anything that would affect ablation so I'm not sure about timing of that. Same note mentions pt would need only 1 day Eliquis hold prior to procedure. Per office protocol, patient can hold Eliquis for 1 day prior to procedure.    **This guidance is not considered finalized until pre-operative APP has relayed final recommendations.**

## 2022-12-17 NOTE — Telephone Encounter (Signed)
Patient states that he is now seeing Dr. Sande Rives with [facility] cardiology. Pt states that he is putting his surgery on hold until after he has his ablalation. Pt also states that he will most likely continue to follow Dr. Melvyn Neth as his new cardiologist. Will let requesting office know that clearance will need to come from [facility] cardiology

## 2023-01-04 ENCOUNTER — Ambulatory Visit: Payer: Medicare Other | Admitting: Nurse Practitioner

## 2023-01-04 ENCOUNTER — Telehealth (HOSPITAL_BASED_OUTPATIENT_CLINIC_OR_DEPARTMENT_OTHER): Payer: Self-pay

## 2023-01-04 NOTE — Telephone Encounter (Signed)
Pt called and stated he wants to come back in to have his knee drained again. Appt for this was made. He also stated his leg is starting to swell. No calf pain or tenderness. York Spaniel he has been working a lot lately. I advised pt to get some compression socks for work and to ice and elevate until he sees [place]. If he gets worse he was instructed to call us back

## 2023-01-07 ENCOUNTER — Ambulatory Visit (INDEPENDENT_AMBULATORY_CARE_PROVIDER_SITE_OTHER): Payer: Medicare Other | Admitting: Student

## 2023-01-07 ENCOUNTER — Encounter (HOSPITAL_BASED_OUTPATIENT_CLINIC_OR_DEPARTMENT_OTHER): Payer: Self-pay | Admitting: Student

## 2023-01-07 DIAGNOSIS — M7052 Other bursitis of knee, left knee: Secondary | ICD-10-CM

## 2023-01-07 DIAGNOSIS — M25562 Pain in left knee: Secondary | ICD-10-CM | POA: Diagnosis not present
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
## 2023-01-09 HISTORY — PX: ABLATION: SHX5711

## 2023-01-30 ENCOUNTER — Encounter (HOSPITAL_BASED_OUTPATIENT_CLINIC_OR_DEPARTMENT_OTHER): Payer: Self-pay | Admitting: *Deleted

## 2023-01-30 ENCOUNTER — Other Ambulatory Visit: Payer: Self-pay

## 2023-01-30 ENCOUNTER — Emergency Department (HOSPITAL_BASED_OUTPATIENT_CLINIC_OR_DEPARTMENT_OTHER): Payer: Medicare Other

## 2023-01-30 ENCOUNTER — Emergency Department (HOSPITAL_BASED_OUTPATIENT_CLINIC_OR_DEPARTMENT_OTHER)
Admission: EM | Admit: 2023-01-30 | Discharge: 2023-01-31 | Disposition: A | Discharge status: Home / Self Care | Payer: Medicare Other | Attending: Emergency Medicine | Admitting: Emergency Medicine

## 2023-01-30 DIAGNOSIS — R339 Retention of urine, unspecified: Secondary | ICD-10-CM | POA: Diagnosis not present

## 2023-01-30 DIAGNOSIS — R1030 Lower abdominal pain, unspecified: Secondary | ICD-10-CM | POA: Diagnosis present

## 2023-01-30 DIAGNOSIS — K5900 Constipation, unspecified: Secondary | ICD-10-CM

## 2023-01-30 DIAGNOSIS — Z7901 Long term (current) use of anticoagulants: Secondary | ICD-10-CM | POA: Diagnosis not present

## 2023-01-30 HISTORY — DX: Unspecified atrial fibrillation: I48.91

## 2023-01-30 LAB — URINALYSIS, ROUTINE W REFLEX MICROSCOPIC
Bilirubin Urine: NEGATIVE
Glucose, UA: NEGATIVE mg/dL
Hgb urine dipstick: NEGATIVE
Ketones, ur: NEGATIVE mg/dL
Leukocytes,Ua: NEGATIVE
Nitrite: NEGATIVE
Specific Gravity, Urine: 1.017 (ref 1.005–1.030)
pH: 5.5 (ref 5.0–8.0)

## 2023-01-30 LAB — CBC WITH DIFFERENTIAL/PLATELET
Abs Immature Granulocytes: 0.12 10*3/uL — ABNORMAL HIGH (ref 0.00–0.07)
Basophils Absolute: 0 10*3/uL (ref 0.0–0.1)
Basophils Relative: 0 %
Eosinophils Absolute: 0 10*3/uL (ref 0.0–0.5)
Eosinophils Relative: 0 %
HCT: 51 % (ref 39.0–52.0)
Hemoglobin: 16.9 g/dL (ref 13.0–17.0)
Immature Granulocytes: 1 %
Lymphocytes Relative: 8 %
Lymphs Abs: 1.2 10*3/uL (ref 0.7–4.0)
MCH: 27.3 pg (ref 26.0–34.0)
MCHC: 33.1 g/dL (ref 30.0–36.0)
MCV: 82.4 fL (ref 80.0–100.0)
Monocytes Absolute: 1.2 10*3/uL — ABNORMAL HIGH (ref 0.1–1.0)
Monocytes Relative: 7 %
Neutro Abs: 13 10*3/uL — ABNORMAL HIGH (ref 1.7–7.7)
Neutrophils Relative %: 84 %
Platelets: 348 10*3/uL (ref 150–400)
RBC: 6.19 MIL/uL — ABNORMAL HIGH (ref 4.22–5.81)
RDW: 17.3 % — ABNORMAL HIGH (ref 11.5–15.5)
WBC: 15.5 10*3/uL — ABNORMAL HIGH (ref 4.0–10.5)
nRBC: 0 % (ref 0.0–0.2)

## 2023-01-30 LAB — COMPREHENSIVE METABOLIC PANEL
ALT: 25 U/L (ref 0–44)
AST: 16 U/L (ref 15–41)
Albumin: 4 g/dL (ref 3.5–5.0)
Alkaline Phosphatase: 56 U/L (ref 38–126)
Anion gap: 9 (ref 5–15)
BUN: 26 mg/dL — ABNORMAL HIGH (ref 8–23)
CO2: 23 mmol/L (ref 22–32)
Calcium: 8.9 mg/dL (ref 8.9–10.3)
Chloride: 102 mmol/L (ref 98–111)
Creatinine, Ser: 1.1 mg/dL (ref 0.61–1.24)
GFR, Estimated: >60 mL/min (ref >60)
Glucose, Bld: 115 mg/dL — ABNORMAL HIGH (ref 70–99)
Potassium: 4.1 mmol/L (ref 3.5–5.1)
Sodium: 134 mmol/L — ABNORMAL LOW (ref 135–145)
Total Bilirubin: 0.7 mg/dL (ref 0.3–1.2)
Total Protein: 6.7 g/dL (ref 6.5–8.1)

## 2023-01-30 MED ORDER — LIDOCAINE HCL URETHRAL/MUCOSAL 2 % EX GEL
1.0000 | Freq: Once | CUTANEOUS | Status: AC
  Administered 2023-01-30: 1 via URETHRAL
  Filled 2023-01-30: qty 11

## 2023-01-30 MED ORDER — ONDANSETRON 4 MG PO TBDP
4.0000 mg | ORAL_TABLET | Freq: Once | ORAL | Status: AC
  Administered 2023-01-30: 4 mg via ORAL
  Filled 2023-01-30: qty 1

## 2023-01-30 MED ORDER — SODIUM CHLORIDE 0.9 % IV BOLUS
1000.0000 mL | Freq: Once | INTRAVENOUS | Status: AC
  Administered 2023-01-30: 1000 mL via INTRAVENOUS

## 2023-01-30 MED ORDER — FENTANYL CITRATE PF 50 MCG/ML IJ SOSY
50.0000 ug | PREFILLED_SYRINGE | Freq: Once | INTRAMUSCULAR | Status: AC
  Administered 2023-01-30: 50 ug via INTRAVENOUS
  Filled 2023-01-30: qty 1

## 2023-01-30 MED ORDER — IOHEXOL 300 MG/ML  SOLN
100.0000 mL | Freq: Once | INTRAMUSCULAR | Status: AC | PRN
  Administered 2023-01-30: 100 mL via INTRAVENOUS

## 2023-01-30 MED ORDER — DIAZEPAM 5 MG/ML IJ SOLN
2.5000 mg | Freq: Once | INTRAMUSCULAR | Status: AC
  Administered 2023-01-30: 2.5 mg via INTRAVENOUS
  Filled 2023-01-30: qty 2

## 2023-01-30 MED ORDER — ONDANSETRON HCL 4 MG/2ML IJ SOLN
4.0000 mg | Freq: Once | INTRAMUSCULAR | Status: AC
  Administered 2023-01-30: 4 mg via INTRAVENOUS
  Filled 2023-01-30: qty 2

## 2023-01-30 NOTE — ED Notes (Addendum)
#  16 Fr foley cath inserted without difficulty using sterile technique. Tolerated well. Immediate of clear yellow urine noted. Pt is nauseated and a small amount of vomiting after foley placed. Will update MD.

## 2023-01-30 NOTE — ED Triage Notes (Signed)
Pt states that he had a heart ablation at [facility] a few days ago. C/o "constipation" since Saturday. C/o lower abd pain. States pain is constant. Has taken miralax times 4 doses. Spoke with his MD at [facility] who suggest Prune juice but pt states not able to drink this due to n/v. Last BM on Friday. States decrease in urinary output today. Will bladder scan to see if retaining urine.

## 2023-01-30 NOTE — ED Notes (Addendum)
Pt to CT by stretcher at this time

## 2023-01-30 NOTE — ED Notes (Addendum)
Bladder scanner showed [phone]

## 2023-01-30 NOTE — ED Provider Notes (Signed)
[place] EMERGENCY DEPARTMENT AT [facility] Provider Note   CSN: [id number] Arrival date & time: 01/30/23  2046     History  Chief Complaint  Patient presents with   Abdominal Pain    Lower abdominal pain    [name] Le is a 72 y.o. male.  Patient here with lower abdominal bloating, urinary retention some constipation since he had ablation at [facility] a few days ago.  He has not made much urine and he is having some decreased output having to go frequently.  He has been passing gas.  He has not had any nausea or vomiting but he did throw up once.  He has not really had a substantial bowel movement.  He is been taking MiraLAX.  He denies any chest pain or shortness of breath.  Denies any weakness numbness tingling.  The history is provided by the patient.       Home Medications Prior to Admission medications   Medication Sig Start Date End Date Taking? Authorizing Provider  amiodarone (PACERONE) 200 MG tablet Take by mouth. 12/18/22  Yes [provider]  diltiazem (CARDIZEM) 30 MG tablet Take by mouth. 01/29/23 01/29/24 Yes [provider]  pantoprazole (PROTONIX) 20 MG tablet Take by mouth. 01/29/23 01/29/24 Yes [provider]  sucralfate (CARAFATE) 1 g tablet Take by mouth. 01/29/23 01/29/24 Yes [provider]  acetaminophen (TYLENOL) 500 MG tablet Take 500 mg by mouth every 6 (six) hours as needed (pain.). Tylenol Extra Strength Rapid Release Gels    [provider]  anastrozole (ARIMIDEX) 1 MG tablet Take 0.5 mg by mouth every 14 (fourteen) days.    [provider]  apixaban (ELIQUIS) 5 MG TABS tablet Take 1 tablet (5 mg total) by mouth 2 (two) times daily. 10/25/22 11/24/22  Mealor, Roberts Gaudy, MD  famotidine (PEPCID) 20 MG tablet Take 40 mg by mouth every evening.    [provider]  fexofenadine (ALLEGRA) 180 MG tablet Take 180 mg by mouth daily as needed for allergies.    [provider]  Polyvinyl  Alcohol-Povidone (MURINE TEARS FOR DRY EYES) 5-6 MG/ML SOLN Place 1 drop into both eyes 2 (two) times daily as needed (dry/irritated eyes.).    [provider]  sildenafil (VIAGRA) 100 MG tablet Take 100 mg by mouth as needed for erectile dysfunction. 04/19/22   [provider]      Allergies    Patient has no known allergies.    Review of Systems   Review of Systems  Physical Exam Updated Vital Signs BP (!) 195/119   Pulse 68   Temp 98 F (36.7 C) (Oral)   Resp 16   Ht 5\' 10"  (1.778 m)   Wt 80.3 kg   SpO2 100%   BMI 25.40 kg/m  Physical Exam Vitals and nursing note reviewed.  Constitutional:      General: He is not in acute distress.    Appearance: He is well-developed.  HENT:     Head: Normocephalic and atraumatic.     Mouth/Throat:     Mouth: Mucous membranes are moist.  Eyes:     Extraocular Movements: Extraocular movements intact.     Conjunctiva/sclera: Conjunctivae normal.  Cardiovascular:     Rate and Rhythm: Normal rate and regular rhythm.     Heart sounds: Normal heart sounds. No murmur heard. Pulmonary:     Effort: Pulmonary effort is normal. No respiratory distress.     Breath sounds: Normal breath sounds.  Abdominal:  General: There is distension.     Palpations: Abdomen is soft.     Tenderness: There is abdominal tenderness in the suprapubic area.  Musculoskeletal:        General: No swelling.     Cervical back: Neck supple.  Skin:    General: Skin is warm and dry.     Capillary Refill: Capillary refill takes less than 2 seconds.  Neurological:     General: No focal deficit present.     Mental Status: He is alert.  Psychiatric:        Mood and Affect: Mood normal.     ED Results / Procedures / Treatments   Labs (all labs ordered are listed, but only abnormal results are displayed) Labs Reviewed  CBC WITH DIFFERENTIAL/PLATELET  COMPREHENSIVE METABOLIC PANEL  URINALYSIS, ROUTINE W REFLEX MICROSCOPIC     EKG None  Radiology No results found.  Procedures Procedures    Medications Ordered in ED Medications  ondansetron (ZOFRAN) injection 4 mg (has no administration in time range)  sodium chloride 0.9 % bolus 1,000 mL (has no administration in time range)  lidocaine (XYLOCAINE) 2 % jelly 1 Application (1 Application Urethral Given 01/30/23 2122)  ondansetron (ZOFRAN-ODT) disintegrating tablet 4 mg (4 mg Oral Given 01/30/23 2142)    ED Course/ Medical Decision Making/ A&P                                 Medical Decision Making Amount and/or Complexity of Data Reviewed Labs: ordered. Radiology: ordered.  Risk Prescription drug management.   [name] with abdominal pain.  Normal vitals.  No fever.  He is a little bit hypertensive however.  Just had a cardiac ablation few days ago.  He has been having constipation, difficulty urinating since.  Differential diagnosis could be urinary retention versus ileus versus less likely bowel obstruction.  He had around 400 cc on bladder scan we placed a Foley catheter and he put out about 750 cc of urine.  However he still feeling some discomfort.  Not passing much gas.  Has not had a bowel movement really since the surgery.  He still pretty tender.  He is having some nausea.  Will give IV fluids, IV Zofran and check CBC BMP urinalysis CT scan abdomen pelvis to further evaluate.  I do suspect that this could be an ileus as well.  I have lower suspicion that there is some sort of perforation or other acute intra-abdominal process.  Will hydrate him given a little bit of time and hopefully he will improve.  He has been using MiraLAX already at home.  Can possibly consider an enema but we will see what CT scan shows.  Patient handed off to oncoming ED staff with patient pending lab work, imaging and reevaluation.  Please see their note for further results, evaluation, disposition of the patient.  Patient already follows with urology.  This  chart was dictated using voice recognition software.  Despite best efforts to proofread,  errors can occur which can change the documentation meaning.         Final Clinical Impression(s) / ED Diagnoses Final diagnoses:  Urinary retention    Rx / DC Orders ED Discharge Orders     None         Virgina Norfolk, DO 01/30/23 2219

## 2023-01-30 NOTE — ED Notes (Signed)
Assisted pt to bathroom. Felt like he was going to have a BM. No results. MD in to see pt about having a CT scan.

## 2023-01-30 NOTE — ED Provider Notes (Signed)
Care of the patient assumed at shift change. Here with abdominal pain, vomiting, urinary retention and constipation after cardiac ablation. Awaiting labs and CT.  Physical Exam  BP (!) 196/111   Pulse 68   Temp 98 F (36.7 C) (Oral)   Resp 16   Ht 5\' 10"  (1.778 m)   Wt 80.3 kg   SpO2 99%   BMI 25.40 kg/m   Physical Exam  Procedures  Procedures  ED Course / MDM    Medical Decision Making Amount and/or Complexity of Data Reviewed Labs: ordered. Radiology: ordered.  Risk Prescription drug management.   ***

## 2023-01-31 DIAGNOSIS — R339 Retention of urine, unspecified: Secondary | ICD-10-CM | POA: Diagnosis not present

## 2023-01-31 MED ORDER — APIXABAN 2.5 MG PO TABS
5.0000 mg | ORAL_TABLET | Freq: Once | ORAL | Status: AC
  Administered 2023-01-31: 5 mg via ORAL
  Filled 2023-01-31: qty 2

## 2023-01-31 MED ORDER — DILTIAZEM HCL 30 MG PO TABS
30.0000 mg | ORAL_TABLET | Freq: Once | ORAL | Status: AC
  Administered 2023-01-31: 30 mg via ORAL
  Filled 2023-01-31: qty 1

## 2023-01-31 MED ORDER — FLEET ENEMA RE ENEM
1.0000 | ENEMA | Freq: Once | RECTAL | Status: AC
  Administered 2023-01-31: 1 via RECTAL
  Filled 2023-01-31: qty 1

## 2023-01-31 NOTE — ED Notes (Signed)
Reviewed AVS with patient, patient expressed understanding of directions, denies further questions at this time. Leg bag in place, patient and wife express understanding of use of leg bag.

## 2023-02-01 ENCOUNTER — Telehealth: Payer: Self-pay | Admitting: *Deleted

## 2023-02-01 DIAGNOSIS — I4819 Other persistent atrial fibrillation: Secondary | ICD-10-CM

## 2023-02-01 DIAGNOSIS — G4733 Obstructive sleep apnea (adult) (pediatric): Secondary | ICD-10-CM

## 2023-02-01 NOTE — Telephone Encounter (Signed)
The patient has been notified of the result and verbalized understanding.  All questions (if any) were answered. Latrelle Dodrill, CMA 02/01/2023 9:52 AM    Upon patient request DME selection is [facility]. Patient understands he will be contacted by [facility] to set up his cpap. Patient understands to call if [facility] does not contact him with new setup in a timely manner. Patient understands they will be called once confirmation has been received from [facility]/ that they have received their new machine to schedule 10 week follow up appointment.   [facility] notified of new cpap order  Please add to airview Patient was grateful for the call and thanked me.   CC'd Chart Routing History  Routing History  From: Quintella Reichert, MD On: 09/20/2022 09:55 AM  To: [facility] Sleep Studies (Pool)  Priority: Routine  Routing Comments:  Please let patient know that they have sleep apnea with successful PAP titration.  PAP ordered placed in Epic.  Followup in 6 weeks after starting PAP therapy

## 2023-02-03 ENCOUNTER — Other Ambulatory Visit (HOSPITAL_BASED_OUTPATIENT_CLINIC_OR_DEPARTMENT_OTHER): Payer: Self-pay

## 2023-02-03 ENCOUNTER — Encounter (HOSPITAL_BASED_OUTPATIENT_CLINIC_OR_DEPARTMENT_OTHER): Payer: Self-pay | Admitting: Emergency Medicine

## 2023-02-03 ENCOUNTER — Other Ambulatory Visit: Payer: Self-pay

## 2023-02-03 ENCOUNTER — Emergency Department (HOSPITAL_BASED_OUTPATIENT_CLINIC_OR_DEPARTMENT_OTHER)
Admission: EM | Admit: 2023-02-03 | Discharge: 2023-02-03 | Disposition: A | Discharge status: Home / Self Care | Payer: Medicare Other | Attending: Emergency Medicine | Admitting: Emergency Medicine

## 2023-02-03 DIAGNOSIS — R002 Palpitations: Secondary | ICD-10-CM | POA: Diagnosis present

## 2023-02-03 DIAGNOSIS — Z7901 Long term (current) use of anticoagulants: Secondary | ICD-10-CM | POA: Diagnosis not present

## 2023-02-03 DIAGNOSIS — I4891 Unspecified atrial fibrillation: Secondary | ICD-10-CM

## 2023-02-03 LAB — CBC WITH DIFFERENTIAL/PLATELET
Abs Immature Granulocytes: 0.13 10*3/uL — ABNORMAL HIGH (ref 0.00–0.07)
Basophils Absolute: 0.1 10*3/uL (ref 0.0–0.1)
Basophils Relative: 0 %
Eosinophils Absolute: 0.1 10*3/uL (ref 0.0–0.5)
Eosinophils Relative: 1 %
HCT: 53.2 % — ABNORMAL HIGH (ref 39.0–52.0)
Hemoglobin: 17.7 g/dL — ABNORMAL HIGH (ref 13.0–17.0)
Immature Granulocytes: 1 %
Lymphocytes Relative: 10 %
Lymphs Abs: 1.5 10*3/uL (ref 0.7–4.0)
MCH: 27.3 pg (ref 26.0–34.0)
MCHC: 33.3 g/dL (ref 30.0–36.0)
MCV: 82.1 fL (ref 80.0–100.0)
Monocytes Absolute: 1.6 10*3/uL — ABNORMAL HIGH (ref 0.1–1.0)
Monocytes Relative: 11 %
Neutro Abs: 11 10*3/uL — ABNORMAL HIGH (ref 1.7–7.7)
Neutrophils Relative %: 77 %
Platelets: 315 10*3/uL (ref 150–400)
RBC: 6.48 MIL/uL — ABNORMAL HIGH (ref 4.22–5.81)
RDW: 17.9 % — ABNORMAL HIGH (ref 11.5–15.5)
WBC: 14.3 10*3/uL — ABNORMAL HIGH (ref 4.0–10.5)
nRBC: 0 % (ref 0.0–0.2)

## 2023-02-03 LAB — BASIC METABOLIC PANEL
Anion gap: 7 (ref 5–15)
BUN: 34 mg/dL — ABNORMAL HIGH (ref 8–23)
CO2: 24 mmol/L (ref 22–32)
Calcium: 9 mg/dL (ref 8.9–10.3)
Chloride: 102 mmol/L (ref 98–111)
Creatinine, Ser: 1.24 mg/dL (ref 0.61–1.24)
GFR, Estimated: >60 mL/min (ref >60)
Glucose, Bld: 99 mg/dL (ref 70–99)
Potassium: 4.5 mmol/L (ref 3.5–5.1)
Sodium: 133 mmol/L — ABNORMAL LOW (ref 135–145)

## 2023-02-03 LAB — TROPONIN I (HIGH SENSITIVITY)
Troponin I (High Sensitivity): 48 ng/L — ABNORMAL HIGH (ref <18)
Troponin I (High Sensitivity): 49 ng/L — ABNORMAL HIGH (ref <18)

## 2023-02-03 MED ORDER — DILTIAZEM HCL ER COATED BEADS 180 MG PO CP24
180.0000 mg | ORAL_CAPSULE | Freq: Every day | ORAL | 0 refills | Status: DC
Start: 2023-02-03 — End: 2023-04-25

## 2023-02-03 MED ORDER — DILTIAZEM HCL ER COATED BEADS 120 MG PO CP24
120.0000 mg | ORAL_CAPSULE | Freq: Once | ORAL | Status: AC
  Administered 2023-02-03: 120 mg via ORAL
  Filled 2023-02-03: qty 1

## 2023-02-03 MED ORDER — DILTIAZEM HCL ER 180 MG PO TB24: 180.0000 mg | ORAL_TABLET | Freq: Every day | ORAL | 0 refills | Status: DC

## 2023-02-03 MED ORDER — SODIUM CHLORIDE 0.9 % IV BOLUS
1000.0000 mL | Freq: Once | INTRAVENOUS | Status: AC
  Administered 2023-02-03: 1000 mL via INTRAVENOUS

## 2023-02-03 MED ORDER — DILTIAZEM HCL 30 MG PO TABS
30.0000 mg | ORAL_TABLET | Freq: Once | ORAL | Status: AC
  Administered 2023-02-03: 30 mg via ORAL
  Filled 2023-02-03: qty 1

## 2023-02-03 NOTE — ED Triage Notes (Signed)
Pt states his chest feels funny, called his cardiologist and told to come to ED. Pt had ablation recently. Pt is currently in afib.

## 2023-02-03 NOTE — Discharge Instructions (Addendum)
Your cardiology team will call you for a follow-up appointment.  For now take 180 mg of diltiazem daily as prescribed.  Hold on taking any other diltiazem medication otherwise.  If you have any issues with your prescriptions or your medications please contact your cardiologist to clarify.

## 2023-02-03 NOTE — ED Notes (Signed)
Dc instructions reviewed with patient. Patient voiced understanding. Dc with belongings.  °

## 2023-02-03 NOTE — ED Provider Notes (Signed)
[name] Provider Note   CSN: [id number] Arrival date & time: 02/03/23  4098     History  Chief Complaint  Patient presents with   Palpitations    [name] is a 72 y.o. male.  Patient here with palpitations.  Has had cardiac ablation and cardioversion last week.  He has been taking 60 mg of diltiazem every 6 hours as needed for heart rate over 100 as prescribed by his cardiologist at [name].  He has been doing well but I saw him last week and he developed urinary retention and some constipation from postoperative process and now he thinks he is back in A-fib but he has not had any heart rates really greater than 110.  Seems that when he takes the oral diltiazem heart rate stays in the 80s to 90s range.  He wanted to get checked out.  Denies any active chest pain shortness of breath weakness numbness tingling.  He is already follow-up with his urologist for his Foley catheter which is working well.  He has follow-up with them next week.  The history is provided by the patient.       Home Medications Prior to Admission medications   Medication Sig Start Date End Date Taking? Authorizing Provider  diltiazem (CARDIZEM LA) 180 MG 24 hr tablet Take 1 tablet (180 mg total) by mouth daily for 14 days. 02/03/23 02/17/23 Yes Rani Sisney, DO  silodosin (RAPAFLO) 4 MG CAPS capsule Take 4 mg by mouth daily. 02/02/23  Yes [provider]  acetaminophen (TYLENOL) 500 MG tablet Take 500 mg by mouth every 6 (six) hours as needed (pain.). Tylenol Extra Strength Rapid Release Gels    [provider]  amiodarone (PACERONE) 200 MG tablet Take by mouth. 12/18/22   [provider]  anastrozole (ARIMIDEX) 1 MG tablet Take 0.5 mg by mouth every 14 (fourteen) days.    [provider]  apixaban (ELIQUIS) 5 MG TABS tablet Take 1 tablet (5 mg total) by mouth 2 (two) times daily. 10/25/22 11/24/22  Mealor, Roberts Gaudy, MD   famotidine (PEPCID) 20 MG tablet Take 40 mg by mouth every evening.    [provider]  fexofenadine (ALLEGRA) 180 MG tablet Take 180 mg by mouth daily as needed for allergies.    [provider]  pantoprazole (PROTONIX) 20 MG tablet Take by mouth. 01/29/23 01/29/24  [provider]  Polyvinyl Alcohol-Povidone (MURINE TEARS FOR DRY EYES) 5-6 MG/ML SOLN Place 1 drop into both eyes 2 (two) times daily as needed (dry/irritated eyes.).    [provider]  sildenafil (VIAGRA) 100 MG tablet Take 100 mg by mouth as needed for erectile dysfunction. 04/19/22   [provider]  sucralfate (CARAFATE) 1 g tablet Take by mouth. 01/29/23 01/29/24  [provider]      Allergies    Patient has no known allergies.    Review of Systems   Review of Systems  Physical Exam Updated Vital Signs BP 108/78   Pulse 84   Temp 97.7 F (36.5 C) (Oral)   Resp 19   SpO2 97%  Physical Exam Vitals and nursing note reviewed.  Constitutional:      General: He is not in acute distress.    Appearance: He is well-developed.  HENT:     Head: Normocephalic and atraumatic.     Mouth/Throat:     Mouth: Mucous membranes are moist.  Eyes:     Extraocular Movements: Extraocular  movements intact.     Conjunctiva/sclera: Conjunctivae normal.     Pupils: Pupils are equal, round, and reactive to light.  Cardiovascular:     Rate and Rhythm: Normal rate. Rhythm irregular.     Pulses: Normal pulses.     Heart sounds: No murmur heard. Pulmonary:     Effort: Pulmonary effort is normal. No respiratory distress.     Breath sounds: Normal breath sounds.  Abdominal:     Palpations: Abdomen is soft.     Tenderness: There is no abdominal tenderness.  Musculoskeletal:        General: No swelling.     Cervical back: Neck supple.  Skin:    General: Skin is warm and dry.     Capillary Refill: Capillary refill takes less than 2 seconds.  Neurological:     Mental Status: He is  alert.  Psychiatric:        Mood and Affect: Mood normal.     ED Results / Procedures / Treatments   Labs (all labs ordered are listed, but only abnormal results are displayed) Labs Reviewed  CBC WITH DIFFERENTIAL/PLATELET - Abnormal; Notable for the following components:      Result Value   WBC 14.3 (*)    RBC 6.48 (*)    Hemoglobin 17.7 (*)    HCT 53.2 (*)    RDW 17.9 (*)    Neutro Abs 11.0 (*)    Monocytes Absolute 1.6 (*)    Abs Immature Granulocytes 0.13 (*)    All other components within normal limits  BASIC METABOLIC PANEL - Abnormal; Notable for the following components:   Sodium 133 (*)    BUN 34 (*)    All other components within normal limits  TROPONIN I (HIGH SENSITIVITY) - Abnormal; Notable for the following components:   Troponin I (High Sensitivity) 48 (*)    All other components within normal limits  TROPONIN I (HIGH SENSITIVITY) - Abnormal; Notable for the following components:   Troponin I (High Sensitivity) 49 (*)    All other components within normal limits    EKG EKG Interpretation Date/Time:  Thursday February 03 2023 09:55:38 EDT Ventricular Rate:  96 PR Interval:    QRS Duration:  95 QT Interval:  365 QTC Calculation: 476 R Axis:   41  Text Interpretation: Atrial fibrillation Anterior infarct, old Confirmed by Virgina Norfolk [phone]) on 02/03/2023 10:09:22 AM  Radiology No results found.  Procedures Procedures    Medications Ordered in ED Medications  diltiazem (CARDIZEM CD) 24 hr capsule 120 mg (has no administration in time range)  sodium chloride 0.9 % bolus 1,000 mL (1,000 mLs Intravenous New Bag/Given 02/03/23 1007)  diltiazem (CARDIZEM) tablet 30 mg (30 mg Oral Given 02/03/23 1003)    ED Course/ Medical Decision Making/ A&P                                 Medical Decision Making Amount and/or Complexity of Data Reviewed Labs: ordered.  Risk Prescription drug management.   [name] is here with concern for  palpitations and A-fib.  Does appear that he is back into A-fib but does appear to be rate controlled.  Just had cardioversion and ablation last week.  He is been taking 60 mg of diltiazem every 6 hours for heart rates over 100 and that has done a good job of keeping his heart rate below 100.  He is not  having any chest pain or shortness of breath.  Vital signs are unremarkable anyway.  I saw him a few days after his ablation where he had urinary retention and constipation.  He has been doing well with his Foley catheter.  He has been having good bowel movements.  He is not having any abdominal pain and overall feels really improved.  He has seen his urologist already as well.  Overall we will check basic labs to see if there is any active electrolyte abnormalities give IV fluids.  He has had multiple cardioversions in the past but does not seem like anything keeps him out of A-fib now long-term.  Ultimately we will make sure there is no electrolytes to be repleted we will give him some IV fluids and ultimately have him follow-up with his cardiologist outpatient for further outpatient management.  He is rate controlled he is asymptomatic he is on anticoagulation.  Per my review and interpretation of labs no significant anemia or electrolyte abnormality kidney injury or leukocytosis.  Opponent is 48 and will trend.  This is expected given recent ablation.  He is not having any active chest pain.  EKG is reassuring.  Heart rate seems to be rate controlled in the 80s.  Blood pressure reassuring.  Feeling better after IV fluids.  Troponin stable at 48.  I talked with his cardiology team at [facility] who recommends actually putting him back on extended release Cardizem 180 mg daily.  They will flag him for close follow-up.  He has remained rate controlled in the 80s.  Understands return precautions.  Discharged in good condition.  This chart was dictated using voice recognition software.  Despite best efforts to  proofread,  errors can occur which can change the documentation meaning.         Final Clinical Impression(s) / ED Diagnoses Final diagnoses:  Atrial fibrillation, unspecified type [facility])    Rx / DC Orders ED Discharge Orders          Ordered    diltiazem (CARDIZEM LA) 180 MG 24 hr tablet  Daily        02/03/23 1231              Virgina Norfolk, DO 02/03/23 1232

## 2023-02-03 NOTE — Addendum Note (Signed)
Addended by: Reesa Chew on: 02/03/2023 12:09 PM   Modules accepted: Orders

## 2023-02-04 ENCOUNTER — Ambulatory Visit: Payer: Medicare Other | Admitting: Nurse Practitioner

## 2023-02-15 ENCOUNTER — Ambulatory Visit (INDEPENDENT_AMBULATORY_CARE_PROVIDER_SITE_OTHER): Payer: Medicare Other | Admitting: Nurse Practitioner

## 2023-02-15 ENCOUNTER — Encounter: Payer: Self-pay | Admitting: Nurse Practitioner

## 2023-02-15 VITALS — BP 120/88 | HR 103 | Temp 97.9°F | Ht 68.0 in | Wt 171.4 lb

## 2023-02-15 DIAGNOSIS — Z7689 Persons encountering health services in other specified circumstances: Secondary | ICD-10-CM

## 2023-02-15 DIAGNOSIS — K219 Gastro-esophageal reflux disease without esophagitis: Secondary | ICD-10-CM

## 2023-02-15 DIAGNOSIS — I4819 Other persistent atrial fibrillation: Secondary | ICD-10-CM

## 2023-02-15 NOTE — Assessment & Plan Note (Signed)
Patient currently maintained on diltiazem 180 mg daily.  Patient also has a breakthrough dose of 60 mg taken for heart rates above 100 persistently.  Currently maintained on apixaban also.  Followed by [facility] cardiology has a cardioversion scheduled for this Friday.  Patient in atrial fibrillation in office but rate controlled

## 2023-02-15 NOTE — Progress Notes (Signed)
New Patient Office Visit  Subjective    Patient ID: [name], male    DOB: Oct 16, 1950  Age: 72 y.o. MRN: [id number]  CC:  Chief Complaint  Patient presents with   Establish Care    HPI [name] presents to establish care  Afib: [name] is cardiologist at [name]. He has had an ablation in the past and has cardioversion scheduled Friday   GERD: famotidine is as needed. States that he is eating earlier   OSA: uses CPAP. States that he started it last night and did not do well. States he did a sleep study at [name]    BPH:? [name]. On silodosin and sildenafil.  He states he sees them yearly.  Colonoscopy: states that it has been 3-5 years ago. [name]  PSA: followed by [facility]   Tdap: up to date  Flu: deferred  Covid: original series Shingles: discussed get at local pharmacy. PNA: discussed       Outpatient Encounter Medications as of 02/15/2023  Medication Sig   acetaminophen (TYLENOL) 500 MG tablet Take 500 mg by mouth every 6 (six) hours as needed (pain.). Tylenol Extra Strength Rapid Release Gels   amiodarone (PACERONE) 200 MG tablet Take by mouth.   anastrozole (ARIMIDEX) 1 MG tablet Take 0.5 mg by mouth every 14 (fourteen) days.   DILT-XR 120 MG 24 hr capsule Take 120 mg by mouth daily.   diltiazem (CARDIZEM CD) 180 MG 24 hr capsule Take 1 capsule (180 mg total) by mouth daily.   famotidine (PEPCID) 20 MG tablet Take 40 mg by mouth every evening.   fexofenadine (ALLEGRA) 180 MG tablet Take 180 mg by mouth daily as needed for allergies.   sildenafil (VIAGRA) 100 MG tablet Take 100 mg by mouth as needed for erectile dysfunction.   apixaban (ELIQUIS) 5 MG TABS tablet Take 1 tablet (5 mg total) by mouth 2 (two) times daily.   pantoprazole (PROTONIX) 20 MG tablet Take by mouth. (Patient not taking: Reported on 02/15/2023)   silodosin (RAPAFLO) 4 MG CAPS capsule Take 4 mg by mouth daily. (Patient not taking: Reported on 02/15/2023)    sucralfate (CARAFATE) 1 g tablet Take by mouth. (Patient not taking: Reported on 02/15/2023)   [DISCONTINUED] Polyvinyl Alcohol-Povidone (MURINE TEARS FOR DRY EYES) 5-6 MG/ML SOLN Place 1 drop into both eyes 2 (two) times daily as needed (dry/irritated eyes.). (Patient not taking: Reported on 02/15/2023)   [DISCONTINUED] sulfamethoxazole-trimethoprim (BACTRIM DS) 800-160 MG tablet Take 1 tablet by mouth 2 (two) times daily. (Patient not taking: Reported on 02/15/2023)   [DISCONTINUED] TIADYLT ER 180 MG 24 hr capsule Take 180 mg by mouth daily. (Patient not taking: Reported on 02/15/2023)   No facility-administered encounter medications on file as of 02/15/2023.    Past Medical History:  Diagnosis Date   Atrial fibrillation (HCC)    Closed fracture of tuft of distal phalanx of finger 90's   left   Closed fracture of tuft of distal phalanx of right thumb with malunion 09/07/2012   ORIF with pin [place])   ED (erectile dysfunction)    GERD (gastroesophageal reflux disease)    Lumbar degenerative disc disease    no problems that limit activity   MRSA (methicillin resistant staph aureus) culture positive 06/12/2012   left axilla    Past Surgical History:  Procedure Laterality Date   ABLATION  01/2023   CARDIOVERSION N/A 07/29/2022   Procedure: CARDIOVERSION;  Surgeon: Parke Poisson, MD;  Location:  [facility] ENDOSCOPY;  Service: Cardiovascular;  Laterality: N/A;   CARDIOVERSION N/A 09/23/2022   Procedure: CARDIOVERSION;  Surgeon: Wendall Stade, MD;  Location: [facility] INVASIVE CV LAB;  Service: Cardiovascular;  Laterality: N/A;   LACERATION REPAIR Left    chain laceration left forearm - 13 stitches.   TONSILLECTOMY     tuft fracture Left 09/07/2012   ORIF with PIN ( [place])    Family History  Problem Relation Age of Onset   Lung cancer Mother    Cancer Mother        lung with mets    Social History   Socioeconomic History   Marital status: Married    Spouse name: [name]   Number of  children: 2   Years of education: 12   Highest education level: Not on file  Occupational History   Occupation: [employer]  Tobacco Use   Smoking status: Never   Smokeless tobacco: Never   Tobacco comments:    Never smoke 08/06/22  Vaping Use   Vaping status: Never Used  Substance and Sexual Activity   Alcohol use: Not Currently   Drug use: No   Sexual activity: Yes    Partners: Female  Other Topics Concern   Not on file  Social History Narrative   HSG, 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 Work - Psychiatrist. Marriage in good health.   Social Determinants of Health   Financial Resource Strain: Not on file  Food Insecurity: Not on file  Transportation Needs: Not on file  Physical Activity: Not on file  Stress: Not on file  Social Connections: Not on file  Intimate Partner Violence: Not on file    Review of Systems  Constitutional:  Negative for chills and fever.  Respiratory:  Negative for shortness of breath.   Cardiovascular:  Negative for chest pain.  Gastrointestinal:        BM daily   Neurological:  Negative for headaches.  Psychiatric/Behavioral:  Negative for hallucinations and suicidal ideas.         Objective    BP 120/88   Pulse (!) 103   Temp 97.9 F (36.6 C) (Oral)   Ht 5\' 8"  (1.727 m)   Wt 171 lb 6.4 oz (77.7 kg)   SpO2 96%   BMI 26.06 kg/m   Physical Exam Vitals and nursing note reviewed.  Constitutional:      Appearance: Normal appearance.  HENT:     Right Ear: Tympanic membrane, ear canal and external ear normal.     Left Ear: Ear canal and external ear normal. There is impacted cerumen.     Mouth/Throat:     Mouth: Mucous membranes are moist.     Pharynx: Oropharynx is clear.  Eyes:     Extraocular Movements: Extraocular movements intact.     Pupils: Pupils are equal, round, and reactive to light.   Cardiovascular:     Rate and Rhythm: Normal rate. Rhythm irregular.     Heart sounds: Normal heart sounds.  Pulmonary:     Effort: Pulmonary effort is normal.     Breath sounds: Normal breath sounds.  Musculoskeletal:     Right lower leg: No edema.     Left lower leg: No edema.  Lymphadenopathy:     Cervical: No cervical adenopathy.  Neurological:     General: No focal deficit present.     Mental  Status: He is alert.     Deep Tendon Reflexes:     Reflex Scores:      Bicep reflexes are 2+ on the right side and 2+ on the left side.      Patellar reflexes are 2+ on the right side and 2+ on the left side.    Comments: Bilateral upper and lower extremity strength 5/5         Assessment & Plan:   Problem List Items Addressed This Visit       Cardiovascular and Mediastinum   Atrial fibrillation (HCC) - Primary    Patient currently maintained on diltiazem 180 mg daily.  Patient also has a breakthrough dose of 60 mg taken for heart rates above 100 persistently.  Currently maintained on apixaban also.  Followed by [facility] cardiology has a cardioversion scheduled for this Friday.  Patient in atrial fibrillation in office but rate controlled      Relevant Medications   DILT-XR 120 MG 24 hr capsule     Digestive   GERD (gastroesophageal reflux disease)    Patient currently maintained on famotidine as needed.        Other   Encounter to establish care    Did review briefly the EMR.        Return in about 6 months (around 08/16/2023) for BP/Afib recheck .   Audria Nine, NP

## 2023-02-15 NOTE — Assessment & Plan Note (Signed)
Patient currently maintained on famotidine as needed.

## 2023-02-15 NOTE — Assessment & Plan Note (Signed)
Did review briefly the EMR.

## 2023-02-15 NOTE — Patient Instructions (Signed)
Nice to see you today I want to see you in 6 months, sooner If you need me  Consider getting the Shingles vaccine and the pneumonia vaccine

## 2023-02-18 ENCOUNTER — Ambulatory Visit: Payer: Medicare Other | Admitting: Cardiovascular Disease

## 2023-03-01 ENCOUNTER — Other Ambulatory Visit (HOSPITAL_COMMUNITY): Payer: Medicare Other

## 2023-03-08 ENCOUNTER — Encounter (HOSPITAL_COMMUNITY): Payer: Self-pay

## 2023-03-08 ENCOUNTER — Ambulatory Visit (HOSPITAL_COMMUNITY): Admit: 2023-03-08 | Payer: Medicare Other | Admitting: Cardiovascular Disease

## 2023-03-08 SURGERY — ATRIAL FIBRILLATION ABLATION
Anesthesia: General

## 2023-03-23 ENCOUNTER — Telehealth: Payer: Self-pay

## 2023-03-23 NOTE — Telephone Encounter (Signed)
I spoke with pt; pt said poison control said pt should be fine by taking the extra dose. Pt said he feels fine. Pt wants to know since taking 2 doses within 12 hours should pt take tonights dose as scheduled or skip that dose. Pt is not sure who prescribed the Silodosion 4 mg. Pt does not have bottle with him. Pt request cb after reviewed by Audria Nine NP. Can leave detailed message if no answer. [facility] [address]. Sending note to Audria Nine NP.

## 2023-03-23 NOTE — Telephone Encounter (Signed)
Recommend he hold his night time dose tonight, resume tomorrow night.   Does he follow with Urology? IF he does, then they likely prescribe the medication. He can call them to ask as well.   I doubt he takes this medication for BP control.

## 2023-03-23 NOTE — Telephone Encounter (Signed)
Contacted pt. Relayed message of holding dose and to continue tomorrow night.  Pt understood and verbalized understanding very clearly.  Pt stated that he does see Urology and has an appointment in December.  Pt states that the medication did not come from Urology; came from the hospital.

## 2023-03-24 ENCOUNTER — Telehealth: Payer: Self-pay | Admitting: Cardiology

## 2023-03-24 NOTE — Telephone Encounter (Signed)
What problem are you experiencing? Patient having trouble breathing through his nose with his mouth closed. He states that he is getting dry mouth and is trying to get over a cold. He does sound like he is losing his voice while talking with him on the phone. He spoke with the DME company and they stated that he may need to have the humidity adjusted on his machine but to speak with Dr. Norris Cross office regarding this.   Who is your medical equipment company? [employer]  3)    If patient is calling about their sleep study results please route to [facility] Sleep Study Pool.   Please route to the sleep study coordinator.

## 2023-04-05 NOTE — Telephone Encounter (Signed)
Reached out to the patient to ask m what mask he was using and he uses the mask that covers his mouth and his nose.

## 2023-04-19 NOTE — Telephone Encounter (Signed)
Call to patient to advise per Dr. Mayford Knife he needs to get out instruction manual and if canot figure out how to increase humidity then needs to take device to DME to help figure it out.  Can increase humidity nightly slowly until moucth no longer dry.  If he gets water or condensation in the mouth or mask then humidity was turned up too high. Patient verbalizes understanding and agrees that his cold symptoms and mask fit may have also contributed to his problems with cpap. Patient states he will attempt to adjust humidity and will discuss results with Dr. Mayford Knife at next appt 04/25/23.

## 2023-04-22 ENCOUNTER — Ambulatory Visit (INDEPENDENT_AMBULATORY_CARE_PROVIDER_SITE_OTHER): Payer: Medicare Other | Admitting: Student

## 2023-04-22 ENCOUNTER — Encounter (HOSPITAL_BASED_OUTPATIENT_CLINIC_OR_DEPARTMENT_OTHER): Payer: Self-pay | Admitting: Student

## 2023-04-22 DIAGNOSIS — M7052 Other bursitis of knee, left knee: Secondary | ICD-10-CM | POA: Diagnosis not present

## 2023-04-22 DIAGNOSIS — M25562 Pain in left knee: Secondary | ICD-10-CM | POA: Diagnosis not present

## 2023-04-23 DIAGNOSIS — M7052 Other bursitis of knee, left knee: Secondary | ICD-10-CM

## 2023-04-23 MED ORDER — OXYCODONE HCL 5 MG PO CAPS: 5.0000 mg | ORAL_CAPSULE | ORAL | 0 refills | Status: DC | PRN

## 2023-04-23 MED ORDER — ACETAMINOPHEN 500 MG PO TABS: 500.0000 mg | ORAL_TABLET | Freq: Four times a day (QID) | ORAL | 0 refills | Status: AC | PRN

## 2023-04-23 MED ORDER — IBUPROFEN 800 MG PO TABS: 800.0000 mg | ORAL_TABLET | Freq: Three times a day (TID) | ORAL | 0 refills | Status: AC | PRN

## 2023-04-23 NOTE — Progress Notes (Signed)
Chief Complaint: Left knee swelling     History of Present Illness:   04/23/23: Patient is here today for follow-up and potential aspiration of his left knee.  He has had recurrent fluid from knee bursitis since July of this year.  His knee has been swollen for a number of weeks now but he has recently undergone a cardiac ablation which has taken priority.  There was prior discussion a few months ago about a possible bursectomy as he has had a cortisone injection multiple aspirations without any long-term resolution of his symptoms.  Surgical History:   None  PMH/PSH/Family History/Social History/Meds/Allergies:    Past Medical History:  Diagnosis Date   Atrial fibrillation (HCC)    Closed fracture of tuft of distal phalanx of finger 90's   left   Closed fracture of tuft of distal phalanx of right thumb with malunion 09/07/2012   ORIF with pin Butler [place])   ED (erectile dysfunction)    GERD (gastroesophageal reflux disease)    Lumbar degenerative disc disease    no problems that limit activity   MRSA (methicillin resistant staph aureus) culture positive 06/12/2012   left axilla   Past Surgical History:  Procedure Laterality Date   ABLATION  01/2023   CARDIOVERSION N/A 07/29/2022   Procedure: CARDIOVERSION;  Surgeon: Parke Poisson, MD;  Location: [facility] ENDOSCOPY;  Service: Cardiovascular;  Laterality: N/A;   CARDIOVERSION N/A 09/23/2022   Procedure: CARDIOVERSION;  Surgeon: Wendall Stade, MD;  Location: [facility] INVASIVE CV LAB;  Service: Cardiovascular;  Laterality: N/A;   LACERATION REPAIR Left    chain laceration left forearm - 13 stitches.   TONSILLECTOMY     tuft fracture Left 09/07/2012   ORIF with PIN ( Grammig)   Social History   Socioeconomic History   Marital status: Married    Spouse name: Zella Ball   Number of children: 2   Years of education: 12   Highest education level: Not on file  Occupational History   Occupation: hospital  gas systems  Tobacco Use   Smoking status: Never   Smokeless tobacco: Never   Tobacco comments:    Never smoke 08/06/22  Vaping Use   Vaping status: Never Used  Substance and Sexual Activity   Alcohol use: Not Currently   Drug use: No   Sexual activity: Yes    Partners: Female  Other Topics Concern   Not on file  Social History Narrative   HSG, 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 Work - Psychiatrist. Marriage in good health.   Social Drivers of Corporate investment banker Strain: Not on file  Food Insecurity: Not on file  Transportation Needs: Not on file  Physical Activity: Not on file  Stress: Not on file  Social Connections: Not on file   Family History  Problem Relation Age of Onset   Lung cancer Mother    Cancer Mother        lung with mets   No Known Allergies Current Outpatient Medications  Medication Sig Dispense Refill   acetaminophen (TYLENOL) 500 MG tablet Take 1 tablet (500 mg total) by mouth every 6 (six) hours as needed for up to 14 days. 30 tablet 0  ibuprofen (ADVIL) 800 MG tablet Take 1 tablet (800 mg total) by mouth every 8 (eight) hours as needed for up to 14 days. 30 tablet 0   oxycodone (OXY-IR) 5 MG capsule Take 1 capsule (5 mg total) by mouth every 4 (four) hours as needed. 20 capsule 0   acetaminophen (TYLENOL) 500 MG tablet Take 500 mg by mouth every 6 (six) hours as needed (pain.). Tylenol Extra Strength Rapid Release Gels     amiodarone (PACERONE) 200 MG tablet Take by mouth.     anastrozole (ARIMIDEX) 1 MG tablet Take 0.5 mg by mouth every 14 (fourteen) days.     apixaban (ELIQUIS) 5 MG TABS tablet Take 1 tablet (5 mg total) by mouth 2 (two) times daily. 60 tablet 6   DILT-XR 120 MG 24 hr capsule Take 120 mg by mouth daily.     diltiazem (CARDIZEM CD) 180 MG 24 hr capsule Take 1 capsule (180 mg total) by mouth daily. 30  capsule 0   famotidine (PEPCID) 20 MG tablet Take 40 mg by mouth every evening.     fexofenadine (ALLEGRA) 180 MG tablet Take 180 mg by mouth daily as needed for allergies.     pantoprazole (PROTONIX) 20 MG tablet Take by mouth. (Patient not taking: Reported on 02/15/2023)     sildenafil (VIAGRA) 100 MG tablet Take 100 mg by mouth as needed for erectile dysfunction.     silodosin (RAPAFLO) 4 MG CAPS capsule Take 4 mg by mouth daily. (Patient not taking: Reported on 02/15/2023)     sucralfate (CARAFATE) 1 g tablet Take by mouth. (Patient not taking: Reported on 02/15/2023)     No current facility-administered medications for this visit.   No results found.  Review of Systems:   A ROS was performed including pertinent positives and negatives as documented in the HPI.  Physical Exam :   Constitutional: NAD and appears stated age Neurological: Alert and oriented Psych: Appropriate affect and cooperative There were no vitals taken for this visit.   Comprehensive Musculoskeletal Exam:    Large area of fluctuant swelling noted below the patella of the left knee.  No evidence of erythema or palpable warmth.  Active range of motion of the knee from 0 to 90 degrees.  Imaging:     Assessment:   72 y.o. male recurrent left knee infrapatellar bursitis.  There is a significant fluid collection noted on today's exam which patient would like to have aspirated again.  I was able to remove 28 mL of clear fluid without complication.  Due to the recurrent nature and failure with conservative treatment, we did discuss the potential risk and benefits of surgical intervention.  After consideration, patient would like to proceed with elective operative management.  He was able to discuss this with Dr. Steward Drone concerning the procedure and expectations after surgery.  I will go ahead and send postop meds today.  Patient is on Eliquis so will not require any additional DVT prophylaxis.   Plan :    -Left knee  infrapatellar bursa aspirated today -Plan for left knee bursectomy with Dr. Steward Drone      Procedure Note  Patient: [name] Le             Date of Birth: [birth year]           MRN: [id number]             Visit Date: 04/22/2023  Procedures: Visit Diagnoses:  1. Infrapatellar bursitis of left knee  Large Joint Inj: L knee on 04/23/2023 3:59 PM Indications: pain Details: 18 G 1.5 in needle, anterior approach Aspirate: 28 mL clear Outcome: tolerated well, no immediate complications Procedure, treatment alternatives, risks and benefits explained, specific risks discussed. Consent was given by the patient. Immediately prior to procedure a time out was called to verify the correct patient, procedure, equipment, support staff and site/side marked as required. Patient was prepped and draped in the usual sterile fashion.      I personally saw and evaluated the patient, and participated in the management and treatment plan.  Hazle Nordmann, PA-C Orthopedics

## 2023-04-25 ENCOUNTER — Ambulatory Visit: Payer: Medicare Other | Attending: Cardiology | Admitting: Cardiology

## 2023-04-25 VITALS — BP 142/80 | HR 82 | Ht 69.0 in | Wt 173.0 lb

## 2023-04-25 DIAGNOSIS — G4733 Obstructive sleep apnea (adult) (pediatric): Secondary | ICD-10-CM

## 2023-04-25 NOTE — Progress Notes (Signed)
Erroneous encounter

## 2023-04-26 ENCOUNTER — Ambulatory Visit: Payer: Medicare Other

## 2023-05-27 ENCOUNTER — Ambulatory Visit (INDEPENDENT_AMBULATORY_CARE_PROVIDER_SITE_OTHER): Payer: Medicare Other

## 2023-05-27 VITALS — BP 120/80 | Ht 69.0 in | Wt 181.0 lb

## 2023-05-27 DIAGNOSIS — Z0000 Encounter for general adult medical examination without abnormal findings: Secondary | ICD-10-CM

## 2023-05-27 NOTE — Progress Notes (Signed)
Subjective:   [name] Le is a 73 y.o. male who presents for an Initial Medicare Annual Wellness Visit.  Visit Complete: In person  Patient Medicare AWV questionnaire was completed by the patient on 05/22/2023; I have confirmed that all information answered by patient is correct and no changes since this date.  Cardiac Risk Factors include: advanced age (>62men, >59 women);male gender    Objective:    Today's Vitals   05/27/23 0811  BP: 120/80  Weight: 181 lb (82.1 kg)  Height: 5\' 9"  (1.753 m)  PainSc: 0-No pain   Body mass index is 26.73 kg/m.     05/27/2023    8:24 AM 02/03/2023    9:58 AM 01/30/2023    9:04 PM 11/27/2022    8:05 AM 09/23/2022    7:28 AM 07/29/2022    8:14 AM 05/31/2022   11:34 AM  Advanced Directives  Does Patient Have a Medical Advance Directive? No No No No No No No  Would patient like information on creating a medical advance directive?  No - Patient declined  No - Patient declined  No - Patient declined     Current Medications (verified) Outpatient Encounter Medications as of 05/27/2023  Medication Sig   acetaminophen (TYLENOL) 500 MG tablet Take 500 mg by mouth every 6 (six) hours as needed (pain.). Tylenol Extra Strength Rapid Release Gels   amiodarone (PACERONE) 200 MG tablet Take 100 mg by mouth daily.   anastrozole (ARIMIDEX) 1 MG tablet Take 0.5 mg by mouth every 14 (fourteen) days. (Patient not taking: Reported on 04/25/2023)   apixaban (ELIQUIS) 5 MG TABS tablet Take 1 tablet (5 mg total) by mouth 2 (two) times daily.   famotidine (PEPCID) 20 MG tablet Take 40 mg by mouth every evening. (Patient not taking: Reported on 04/25/2023)   oxycodone (OXY-IR) 5 MG capsule Take 1 capsule (5 mg total) by mouth every 4 (four) hours as needed.   sildenafil (VIAGRA) 100 MG tablet Take 100 mg by mouth as needed for erectile dysfunction.   silodosin (RAPAFLO) 4 MG CAPS capsule Take 4 mg by mouth daily.   No facility-administered encounter medications on  file as of 05/27/2023.    Allergies (verified) Patient has no known allergies.   History: Past Medical History:  Diagnosis Date   Atrial fibrillation (HCC)    Closed fracture of tuft of distal phalanx of finger 90's   left   Closed fracture of tuft of distal phalanx of right thumb with malunion 09/07/2012   ORIF with pin Butler [place])   ED (erectile dysfunction)    GERD (gastroesophageal reflux disease)    Lumbar degenerative disc disease    no problems that limit activity   MRSA (methicillin resistant staph aureus) culture positive 06/12/2012   left axilla   Past Surgical History:  Procedure Laterality Date   ABLATION  01/2023   CARDIOVERSION N/A 07/29/2022   Procedure: CARDIOVERSION;  Surgeon: Parke Poisson, MD;  Location: [facility] ENDOSCOPY;  Service: Cardiovascular;  Laterality: N/A;   CARDIOVERSION N/A 09/23/2022   Procedure: CARDIOVERSION;  Surgeon: Wendall Stade, MD;  Location: [facility] INVASIVE CV LAB;  Service: Cardiovascular;  Laterality: N/A;   LACERATION REPAIR Left    chain laceration left forearm - 13 stitches.   TONSILLECTOMY     tuft fracture Left 09/07/2012   ORIF with PIN ( Grammig)   Family History  Problem Relation Age of Onset   Lung cancer Mother    Cancer Mother  lung with mets   Social History   Socioeconomic History   Marital status: Married    Spouse name: Zella Ball   Number of children: 2   Years of education: 12   Highest education level: Not on file  Occupational History   Occupation: hospital gas systems  Tobacco Use   Smoking status: Never   Smokeless tobacco: Never   Tobacco comments:    Never smoke 08/06/22  Vaping Use   Vaping status: Never Used  Substance and Sexual Activity   Alcohol use: Not Currently   Drug use: No   Sexual activity: Yes    Partners: Female  Other Topics Concern   Not on file  Social History Narrative   HSG, 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 Married '78, 1 dtr - '81, 1 son - '79, 1  grandchild. Work - Psychiatrist. Marriage in good health.   Social Drivers of Corporate investment banker Strain: Low Risk  (05/27/2023)   Overall Financial Resource Strain (CARDIA)    Difficulty of Paying Living Expenses: Not hard at all  Food Insecurity: No Food Insecurity (05/27/2023)   Hunger Vital Sign    Worried About Running Out of Food in the Last Year: Never true    Ran Out of Food in the Last Year: Never true  Transportation Needs: No Transportation Needs (05/27/2023)   PRAPARE - Administrator, Civil Service (Medical): No    Lack of Transportation (Non-Medical): No  Physical Activity: Sufficiently Active (05/27/2023)   Exercise Vital Sign    Days of Exercise per Week: 4 days    Minutes of Exercise per Session: 60 min  Stress: No Stress Concern Present (05/27/2023)   [employer] of Occupational Health - Occupational Stress Questionnaire    Feeling of Stress : Not at all  Social Connections: Moderately Isolated (05/27/2023)   Social Connection and Isolation Panel [NHANES]    Frequency of Communication with Friends and Family: More than three times a week    Frequency of Social Gatherings with Friends and Family: Once a week    Attends Religious Services: Never    Database administrator or Organizations: No    Attends Banker Meetings: Never    Marital Status: Married    Tobacco Counseling Counseling given: Not Answered Tobacco comments: Never smoke 08/06/22   Clinical Intake:  Pre-visit preparation completed: Yes  Pain : No/denies pain Pain Score: 0-No pain    BMI - recorded: 26.73 Nutritional Status: BMI 25 -29 Overweight Nutritional Risks: None Diabetes: No  How often do you need to have someone help you when you read instructions, pamphlets, or other written materials from your doctor or pharmacy?: 1 - Never  Interpreter Needed?: No  Comments: lives with wife Information entered  by :: B.Kayla Deshaies,LPN   Activities of Daily Living    05/22/2023    4:12 PM 09/23/2022    7:26 AM  In your present state of health, do you have any difficulty performing the following activities:  Hearing? 0 0  Vision? 0 0  Difficulty concentrating or making decisions? 0 0  Walking or climbing stairs? 0 0  Dressing or bathing? 0 0  Doing errands, shopping? 0   Preparing Food and eating ? N   Using the Toilet? N   In the past six months, have you accidently leaked urine? Y   Do you have problems with loss of bowel control? N  Managing your Medications? N   Managing your Finances? N   Housekeeping or managing your Housekeeping? N     Patient Care Team: Eden Emms, NP as PCP - General (Nurse Practitioner) Quintella Reichert, MD as PCP - Sleep Medicine (Cardiology) Sherre Lain, MD as Referring Physician (Cardiology)  Indicate any recent Medical Services you may have received from other than [facility] providers in the past year (date may be approximate).     Assessment:   This is a routine wellness examination for Jedd.  Hearing/Vision screen Hearing Screening - Comments:: Pt says his hearing is good Vision Screening - Comments:: Pt says his vision is good:needs readers [place]    Goals Addressed             This Visit's Progress    Patient Stated       I would like to manage heart problems and get into more regular walking routine       Depression Screen    05/27/2023    8:19 AM 02/15/2023   12:08 PM 11/08/2016    9:05 AM 11/08/2016    8:11 AM  PHQ 2/9 Scores  PHQ - 2 Score 0 0 1 1  PHQ- 9 Score  6 3     Fall Risk    05/22/2023    4:12 PM 02/15/2023   12:18 PM 11/08/2016    8:11 AM  Fall Risk   Falls in the past year? 0 0 Yes  Number falls in past yr: 0 0 1  Injury with Fall? 0 0 No  Risk for fall due to : No Fall Risks No Fall Risks Other (Comment)  Follow up Education provided;Falls prevention discussed Falls evaluation completed      MEDICARE RISK AT HOME: Medicare Risk at Home Any stairs in or around the home?: (Patient-Rptd) Yes If so, are there any without handrails?: (Patient-Rptd) No Home free of loose throw rugs in walkways, pet beds, electrical cords, etc?: (Patient-Rptd) Yes Adequate lighting in your home to reduce risk of falls?: (Patient-Rptd) Yes Life alert?: (Patient-Rptd) No Use of a cane, walker or w/c?: (Patient-Rptd) No Grab bars in the bathroom?: (Patient-Rptd) No Shower chair or bench in shower?: (Patient-Rptd) No Elevated toilet seat or a handicapped toilet?: (Patient-Rptd) No  TIMED UP AND GO:  Was the test performed? Yes  Length of time to ambulate 10 feet: 8 sec Gait steady and fast without use of assistive device    Cognitive Function:        05/27/2023    8:26 AM  6CIT Screen  What Year? 0 points  What month? 0 points  What time? 0 points  Count back from 20 0 points  Months in reverse 0 points  Repeat phrase 0 points  Total Score 0 points   Immunizations Immunization History  Administered Date(s) Administered   Tdap 08/14/2011    TDAP status: Up to date  Flu Vaccine status: Due, Education has been provided regarding the importance of this vaccine. Advised may receive this vaccine at local pharmacy or Health Dept. Aware to provide a copy of the vaccination record if obtained from local pharmacy or Health Dept. Verbalized acceptance and understanding.  Pneumococcal vaccine status: Due, Education has been provided regarding the importance of this vaccine. Advised may receive this vaccine at local pharmacy or Health Dept. Aware to provide a copy of the vaccination record if obtained from local pharmacy or Health Dept. Verbalized acceptance and understanding.  Covid-19 vaccine status:  Completed vaccines  Qualifies for Shingles Vaccine? Yes   Zostavax completed No   Shingrix Completed?: No.    Education has been provided regarding the importance of this vaccine. Patient has  been advised to call insurance company to determine out of pocket expense if they have not yet received this vaccine. Advised may also receive vaccine at local pharmacy or Health Dept. Verbalized acceptance and understanding.  Screening Tests Health Maintenance  Topic Date Due   Pneumonia Vaccine 8+ Years old (1 of 2 - PCV) Never done   INFLUENZA VACCINE  08/08/2023 (Originally 12/09/2022)   Zoster Vaccines- Shingrix (1 of 2) 08/25/2023 (Originally [birth year]24/1969)   COVID-19 Vaccine (1) 12/16/2023 (Originally 12/23/1955)   DTaP/Tdap/Td (2 - Td or Tdap) 05/26/2024 (Originally 08/13/2021)   Medicare Annual Wellness (AWV)  05/26/2024   Colonoscopy  06/04/2027   Hepatitis C Screening  Completed   HPV VACCINES  Aged Out    Health Maintenance  Health Maintenance Due  Topic Date Due   Pneumonia Vaccine 31+ Years old (1 of 2 - PCV) Never done    Colorectal cancer screening: Type of screening: Colonoscopy. Completed 06/03/2017. Repeat every 10 years  Lung Cancer Screening: (Low Dose CT Chest recommended if Age 62-80 years, 20 pack-year currently smoking OR have quit w/in 15years.) does not qualify.   Lung Cancer Screening Referral: no  Additional Screening:  Hepatitis C Screening: does not qualify; Completed 11/08/2016  Vision Screening: Recommended annual ophthalmology exams for early detection of glaucoma and other disorders of the eye. Is the patient up to date with their annual eye exam?  Yes  Who is the provider or what is the name of the office in which the patient attends annual eye exams? [facility] If pt is not established with a provider, would they like to be referred to a provider to establish care? No .   Dental Screening: Recommended annual dental exams for proper oral hygiene  Diabetic Foot Exam:   Community Resource Referral / Chronic Care Management: CRR required this visit?  No   CCM required this visit?  No    Plan:     I have personally reviewed and noted the  following in the patient's chart:   Medical and social history Use of alcohol, tobacco or illicit drugs  Current medications and supplements including opioid prescriptions. Patient is not currently taking opioid prescriptions. Functional ability and status Nutritional status Physical activity Advanced directives List of other physicians Hospitalizations, surgeries, and ER visits in previous 12 months Vitals Screenings to include cognitive, depression, and falls Referrals and appointments  In addition, I have reviewed and discussed with patient certain preventive protocols, quality metrics, and best practice recommendations. A written personalized care plan for preventive services as well as general preventive health recommendations were provided to patient.     Sue Lush, LPN   08/16/8117   After Visit Summary: (MyChart) Due to this being a telephonic visit, the after visit summary with patients personalized plan was offered to patient via MyChart   Nurse Notes: Pt presents with a mask on stating he has a cold and cough therefore does not desire to get vaccines (Influenza and Pneumococcal) today. He has no concerns or questions at this visit.

## 2023-05-27 NOTE — Patient Instructions (Signed)
Mr. Lones , Thank you for taking time to come for your Medicare Wellness Visit. I appreciate your ongoing commitment to your health goals. Please review the following plan we discussed and let me know if I can assist you in the future.   Referrals/Orders/Follow-Ups/Clinician Recommendations: none  This is a list of the screening recommended for you and due dates:  Health Maintenance  Topic Date Due   Pneumonia Vaccine (1 of 2 - PCV) Never done   Flu Shot  08/08/2023*   Zoster (Shingles) Vaccine (1 of 2) 08/25/2023*   COVID-19 Vaccine (1) 12/16/2023*   DTaP/Tdap/Td vaccine (2 - Td or Tdap) 05/26/2024*   Medicare Annual Wellness Visit  05/26/2024   Colon Cancer Screening  06/04/2027   Hepatitis C Screening  Completed   HPV Vaccine  Aged Out  *Topic was postponed. The date shown is not the original due date.    Advanced directives: (Declined) Advance directive discussed with you today. Even though you declined this today, please call our office should you change your mind, and we can give you the proper paperwork for you to fill out.  Next Medicare Annual Wellness Visit scheduled for next year: Yes 06/01/2024 @ 8:10am in person

## 2023-06-04 ENCOUNTER — Emergency Department (HOSPITAL_BASED_OUTPATIENT_CLINIC_OR_DEPARTMENT_OTHER)
Admission: EM | Admit: 2023-06-04 | Discharge: 2023-06-04 | Disposition: A | Discharge status: Home / Self Care | Payer: Medicare Other | Attending: Emergency Medicine | Admitting: Emergency Medicine

## 2023-06-04 ENCOUNTER — Other Ambulatory Visit: Payer: Self-pay

## 2023-06-04 ENCOUNTER — Emergency Department (HOSPITAL_BASED_OUTPATIENT_CLINIC_OR_DEPARTMENT_OTHER): Payer: Medicare Other | Admitting: Radiology

## 2023-06-04 DIAGNOSIS — S99912A Unspecified injury of left ankle, initial encounter: Secondary | ICD-10-CM | POA: Diagnosis present

## 2023-06-04 DIAGNOSIS — S93402A Sprain of unspecified ligament of left ankle, initial encounter: Secondary | ICD-10-CM | POA: Diagnosis not present

## 2023-06-04 DIAGNOSIS — X501XXA Overexertion from prolonged static or awkward postures, initial encounter: Secondary | ICD-10-CM | POA: Insufficient documentation

## 2023-06-04 DIAGNOSIS — Z7901 Long term (current) use of anticoagulants: Secondary | ICD-10-CM | POA: Diagnosis not present

## 2023-06-04 NOTE — ED Provider Notes (Signed)
[facility] [facility]MENT AT [facility] Provider Note   CSN: [id number] Arrival date & time: 06/04/23  1739     History  No chief complaint on file.   [name] Le is a 73 y.o. male.  Patient with history of atrial fibrillation on anticoagulation presents to the emergency department today for evaluation of left ankle injury.  Patient was helped moving a couch off of the porch today when he stepped awkwardly on a paver stone, rolling over on his ankle.  He had significant pain about an hour afterwards.  He is ambulatory but limping.  He was concerned that he fractured a bone.  He denies knee pain.  No numbness or tingling.  He has seen orthopedics in the past for drainage of the knee.       Home Medications Prior to Admission medications   Medication Sig Start Date End Date Taking? Authorizing Provider  acetaminophen (TYLENOL) 500 MG tablet Take 500 mg by mouth every 6 (six) hours as needed (pain.). Tylenol Extra Strength Rapid Release Gels    [provider]  amiodarone (PACERONE) 200 MG tablet Take 100 mg by mouth daily. 12/18/22   [provider]  anastrozole (ARIMIDEX) 1 MG tablet Take 0.5 mg by mouth every 14 (fourteen) days. Patient not taking: Reported on 04/25/2023    [provider]  apixaban (ELIQUIS) 5 MG TABS tablet Take 1 tablet (5 mg total) by mouth 2 (two) times daily. 10/25/22 04/25/23  Mealor, Roberts Gaudy, MD  famotidine (PEPCID) 20 MG tablet Take 40 mg by mouth every evening. Patient not taking: Reported on 04/25/2023    [provider]  oxycodone (OXY-IR) 5 MG capsule Take 1 capsule (5 mg total) by mouth every 4 (four) hours as needed. 04/23/23   Amador Cunas, PA-C  sildenafil (VIAGRA) 100 MG tablet Take 100 mg by mouth as needed for erectile dysfunction. 04/19/22   [provider]  silodosin (RAPAFLO) 4 MG CAPS capsule Take 4 mg by mouth daily. 02/02/23   [provider]      Allergies     Patient has no known allergies.    Review of Systems   Review of Systems  Physical Exam Updated Vital Signs BP (!) 163/102   Pulse 62   Temp 98 F (36.7 C)   Resp 16   SpO2 100%  Physical Exam Vitals reviewed.  Constitutional:      Appearance: He is well-developed.  HENT:     Head: Normocephalic and atraumatic.  Eyes:     Conjunctiva/sclera: Conjunctivae normal.  Cardiovascular:     Pulses:          Dorsalis pedis pulses are 2+ on the right side and 2+ on the left side.       Posterior tibial pulses are 2+ on the right side and 2+ on the left side.  Pulmonary:     Effort: No respiratory distress.  Musculoskeletal:        General: Tenderness present.     Cervical back: Normal range of motion and neck supple.     Left knee: No bony tenderness. Normal range of motion. No tenderness.     Left lower leg: Tenderness present. No swelling or bony tenderness. No edema.     Left ankle: Tenderness present over the lateral malleolus. No proximal fibula tenderness. Decreased range of motion. Normal pulse.  Skin:    General: Skin is warm and dry.  Neurological:     Mental Status: He  is alert.     Comments: Distal motor, sensation, and vascular intact.     ED Results / Procedures / Treatments   Labs (all labs ordered are listed, but only abnormal results are displayed) Labs Reviewed - No data to display  EKG None  Radiology DG Foot Complete Left Result Date: 06/04/2023 CLINICAL DATA:  Rolled ankle stepping onto edge of paver. EXAM: LEFT FOOT - COMPLETE 3+ VIEW; LEFT ANKLE COMPLETE - 3+ VIEW COMPARISON:  None Available. FINDINGS: Left ankle: Mild atherosclerotic calcifications. Additional calcifications appear to LS portions of the medial ankle capsule just distal to the medial talus and lateral ankle at the distal fibular-talar articulation. This may represent vascular calcifications or synovial calcifications. There is also approximately 12 mm long calcification within the  proximal plantar aponeurosis, centered approximately 2.0 cm distal to the plantar fascia origin. Small plantar calcaneal heel spur. No acute fracture line is seen. No dislocation. Left foot: Minimal lateral great toe metatarsophalangeal joint space narrowing. Mild to moderate dorsal tarsometatarsal degenerative spurring, likely of the second digit. No acute fracture or dislocation. IMPRESSION: 1. No acute fracture of the left ankle or foot. 2. Mild to moderate dorsal tarsometatarsal degenerative osteophytosis, likely of the second digit. 3. Small plantar calcaneal heel spur. 4. Calcification within the proximal plantar aponeurosis, centered approximately 2.0 cm distal to the plantar fascia origin. This may represent sequela of chronic plantar fasciitis. Electronically Signed   By: Neita Garnet M.D.   On: 06/04/2023 19:34   DG Ankle Complete Left Result Date: 06/04/2023 CLINICAL DATA:  Rolled ankle stepping onto edge of paver. EXAM: LEFT FOOT - COMPLETE 3+ VIEW; LEFT ANKLE COMPLETE - 3+ VIEW COMPARISON:  None Available. FINDINGS: Left ankle: Mild atherosclerotic calcifications. Additional calcifications appear to LS portions of the medial ankle capsule just distal to the medial talus and lateral ankle at the distal fibular-talar articulation. This may represent vascular calcifications or synovial calcifications. There is also approximately 12 mm long calcification within the proximal plantar aponeurosis, centered approximately 2.0 cm distal to the plantar fascia origin. Small plantar calcaneal heel spur. No acute fracture line is seen. No dislocation. Left foot: Minimal lateral great toe metatarsophalangeal joint space narrowing. Mild to moderate dorsal tarsometatarsal degenerative spurring, likely of the second digit. No acute fracture or dislocation. IMPRESSION: 1. No acute fracture of the left ankle or foot. 2. Mild to moderate dorsal tarsometatarsal degenerative osteophytosis, likely of the second digit. 3.  Small plantar calcaneal heel spur. 4. Calcification within the proximal plantar aponeurosis, centered approximately 2.0 cm distal to the plantar fascia origin. This may represent sequela of chronic plantar fasciitis. Electronically Signed   By: Neita Garnet M.D.   On: 06/04/2023 19:34    Procedures Procedures    Medications Ordered in ED Medications - No data to display  ED Course/ Medical Decision Making/ A&P    Patient seen and examined. History obtained directly from patient. Work-up including labs, imaging, EKG ordered in triage, if performed, were reviewed.    Labs/EKG: None ordered  Imaging: Independently reviewed and interpreted.  This included: X-ray of the left foot and ankle, agree no acute fractures  Medications/Fluids: None ordered  Most recent vital signs reviewed and are as follows: BP (!) 163/102   Pulse 62   Temp 98 F (36.7 C)   Resp 16   SpO2 100%   Initial impression: Left ankle sprain  Home treatment plan: RICE protocol, ASO/crutches as needed  Return instructions discussed with patient: No worsening symptoms  Follow-up instructions discussed with patient: Orthopedics in the next 5 days                                Medical Decision Making Amount and/or Complexity of Data Reviewed Radiology: ordered.   Patient with ankle injury, lower extremity neurovascularly intact.  X-rays are negative.  Indication for emergent orthopedic consultation.  Routine care indicated with follow-up as outpatient.        Final Clinical Impression(s) / ED Diagnoses Final diagnoses:  Sprain of left ankle, unspecified ligament, initial encounter    Rx / DC Orders ED Discharge Orders     None         Renne Crigler, PA-C 06/04/23 2140    Glyn Ade, MD 06/04/23 2255

## 2023-06-04 NOTE — Discharge Instructions (Signed)
Please read and follow all provided instructions.  Your diagnoses today include:  1. Sprain of left ankle, unspecified ligament, initial encounter     Tests performed today include: An x-ray of your ankle and foot- does NOT show any broken bones Vital signs. See below for your results today.   Medications prescribed:  None  Take any prescribed medications only as directed.  Home care instructions:  Follow any educational materials contained in this packet Follow R.I.C.E. Protocol: R - rest your injury  I  - use ice on injury without applying directly to skin C - compress injury with bandage or splint E - elevate the injury as much as possible  Follow-up instructions: Please follow-up with your porthopedic (bone specialist) if you continue to have significant pain or trouble walking in 1 week. In this case you may have a severe sprain that requires further care.   Return instructions:  Please return if your toes are numb or tingling, appear gray or blue, or you have severe pain (also elevate leg and loosen splint or wrap) Please return to the Emergency Department if you experience worsening symptoms.  Please return if you have any other emergent concerns.  Additional Information:  Your vital signs today were: BP (!) 163/102   Pulse 62   Temp 98 F (36.7 C)   Resp 16   SpO2 100%  If your blood pressure (BP) was elevated above 135/85 this visit, please have this repeated by your doctor within one month. -------------- Your caregiver has diagnosed you as suffering from an ankle sprain. Ankle sprain occurs when the ligaments that hold the ankle joint together are stretched or torn. It may take 4 to 6 weeks to heal.  For Activity: If prescribed crutches, use crutches with non-weight bearing for the first few days. Then, you may walk on your ankle as the pain allows, or as instructed. Start gradually with weight bearing on the affected ankle. Once you can walk pain free, then try  jogging. When you can run forwards, then you can try moving side-to-side. If you cannot walk without crutches in one week, you need a re-check. --------------

## 2023-06-04 NOTE — ED Triage Notes (Signed)
1300- Rolled left ankle stepping onto edge of paver. Difficulty walking on it. No fall.

## 2023-06-10 ENCOUNTER — Other Ambulatory Visit (HOSPITAL_COMMUNITY): Payer: Self-pay | Admitting: Cardiovascular Disease

## 2023-07-15 ENCOUNTER — Ambulatory Visit: Payer: Medicare Other | Attending: Cardiology | Admitting: Cardiology

## 2023-07-15 ENCOUNTER — Encounter: Payer: Self-pay | Admitting: Cardiology

## 2023-07-15 VITALS — BP 130/102 | HR 69 | Ht 69.0 in | Wt 179.4 lb

## 2023-07-15 DIAGNOSIS — G4733 Obstructive sleep apnea (adult) (pediatric): Secondary | ICD-10-CM | POA: Diagnosis present

## 2023-07-15 NOTE — Patient Instructions (Signed)
 Follow-Up: At [facility], you and your health needs are our priority.  As part of our continuing mission to provide you with exceptional heart care, we have created designated Provider Care Teams.  These Care Teams include your primary Cardiologist (physician) and Advanced Practice Providers (APPs -  Physician Assistants and Nurse Practitioners) who all work together to provide you with the care you need, when you need it.  We recommend signing up for the patient portal called "MyChart".  Sign up information is provided on this After Visit Summary.  MyChart is used to connect with patients for Virtual Visits (Telemedicine).  Patients are able to view lab/test results, encounter notes, upcoming appointments, etc.  Non-urgent messages can be sent to your provider as well.   To learn more about what you can do with MyChart, go to [email or web address].    Your next appointment:   To be determined  Provider:   Mayford Knife Other Instructions You have been referred to [facility] for [facility]

## 2023-07-15 NOTE — Addendum Note (Signed)
 Addended by: Erick Alley on: 07/15/2023 08:32 AM   Modules accepted: Orders

## 2023-07-15 NOTE — Progress Notes (Signed)
 Sleep Medicine CONSULT Note    Date:  07/15/2023   ID:  [name] Le, [name] Le [birth year], MRN [id number]  PCP:  Eden Emms, NP  Cardiologist: None   Chief Complaint  Patient presents with   New Patient (Initial Visit)    Obstructive sleep apnea    History of Present Illness:  [name] Le is a 73 y.o. male who is being seen today for the evaluation of obstructive sleep apnea at the request of Rudi Coco, NP.  This is a 73 year old male with a history of paroxysmal atrial fibrillation and GERD.  Due to atrial fibrillation as well as complaints of excessive daytime sleepiness, snoring and witnessed apneas he was sent for a split-night sleep study.  Sleep study showed severe obstructive sleep apnea with an AHI 50.8/h and mild central sleep apnea with a central AHI of 11.8/h.  He was titrated to CPAP at 14 cm H2O.  He is now referred for sleep medicine consultation to establish sleep care and treatment of obstructive sleep apnea.  He really struggled with the CPAP and could not tolerate any of the masks.  Due to noncompliance he had to turn his device in.  It would wake him up in the middle of the night and could not sleep well.  He says that a few times he used it he felt better in the am but for the most part he could not sleep with it on and would disrupt her sleep more.  Past Medical History:  Diagnosis Date   Atrial fibrillation (HCC)    Closed fracture of tuft of distal phalanx of finger 90's   left   Closed fracture of tuft of distal phalanx of right thumb with malunion 09/07/2012   ORIF with pin [place])   ED (erectile dysfunction)    GERD (gastroesophageal reflux disease)    Lumbar degenerative disc disease    no problems that limit activity   MRSA (methicillin resistant staph aureus) culture positive 06/12/2012   left axilla    Past Surgical History:  Procedure Laterality Date   ABLATION  01/2023   CARDIOVERSION N/A 07/29/2022   Procedure: CARDIOVERSION;   Surgeon: Parke Poisson, MD;  [facility] END[facility]COPY;  Service: Cardiovascular;  Laterality: N/A;   CARDIOVERSION N/A 09/23/2022   Procedure: CARDIOVERSION;  Surgeon: Wendall Stade, MD;  Location: [facility] INVASIVE CV LAB;  Service: Cardiovascular;  Laterality: N/A;   LACERATION REPAIR Left    chain laceration left forearm - 13 stitches.   TONSILLECTOMY     tuft fracture Left 09/07/2012   ORIF with PIN ( [place])    Current Medications: Current Meds  Medication Sig   acetaminophen (TYLENOL) 500 MG tablet Take 500 mg by mouth every 6 (six) hours as needed (pain.). Tylenol Extra Strength Rapid Release Gels   amiodarone (PACERONE) 200 MG tablet Take 100 mg by mouth daily.   anastrozole (ARIMIDEX) 1 MG tablet Take 0.5 mg by mouth every 14 (fourteen) days.   diltiazem (CARDIZEM) 30 MG tablet Take by mouth.   famotidine (PEPCID) 20 MG tablet Take 40 mg by mouth every evening.   fexofenadine (ALLEGRA ODT) 30 MG disintegrating tablet Take 30 mg by mouth daily.   ibuprofen (ADVIL) 200 MG tablet Take by mouth.   oxycodone (OXY-IR) 5 MG capsule Take 1 capsule (5 mg total) by mouth every 4 (four) hours as needed.   sildenafil (VIAGRA) 100 MG tablet Take 100 mg by mouth as needed for erectile dysfunction.  silodosin (RAPAFLO) 4 MG CAPS capsule Take 4 mg by mouth daily.    Allergies:   Patient has no known allergies.   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 Work - Psychiatrist. Marriage in good health.   Social Drivers of Corporate investment banker Strain: Low Risk  (05/27/2023)   Overall Financial Resource Strain (CARDIA)    Difficulty of Paying Living Expenses: Not hard at all  Food Insecurity: No Food Insecurity (05/27/2023)   Hunger Vital Sign    Worried About Running Out of Food in the Last Year: Never true    Ran Out of Food in the Last Year: Never true  Transportation Needs: No Transportation Needs (05/27/2023)   PRAPARE - Administrator, Civil Service (Medical): No    Lack of Transportation (Non-Medical): No  Physical Activity: Sufficiently Active (05/27/2023)   Exercise Vital Sign    Days of Exercise per Week: 4 days    Minutes of Exercise per Session: 60 min  Stress: No Stress Concern Present (05/27/2023)   [employer] of Occupational Health - Occupational Stress Questionnaire    Feeling of Stress : Not at all  Social Connections: Moderately Isolated (05/27/2023)   Social Connection and Isolation Panel [NHANES]    Frequency of Communication with Friends and Family: More than three times a week    Frequency of Social Gatherings with Friends and Family: Once a week    Attends Religious Services: Never    Database administrator or Organizations: No    Attends Engineer, structural: Never    Marital Status: Married     Family History:  The patient's family history includes Cancer in his mother; Lung cancer in his mother.   ROS:   Please see the history of present illness.    ROS All other systems reviewed and are negative.      No data to display             PHYSICAL EXAM:   VS:  BP (!) 130/102   Pulse 69   Ht 5\' 9"  (1.753 m)   Wt 179 lb 6.4 oz (81.4 kg)   SpO2 98%   BMI 26.49 kg/m    GEN: Well nourished, well developed, in no acute  distress  HEENT: normal  Neck: no JVD, carotid bruits, or masses Cardiac: RRR; no murmurs, rubs, or gallops,no edema.  Intact distal pulses bilaterally.  Respiratory:  clear to auscultation bilaterally, normal work of breathing GI: soft, nontender, nondistended, + BS MS: no deformity or atrophy  Skin: warm and dry, no rash Neuro:  Alert and Oriented x 3, Strength and sensation are intact Psych: euthymic mood, full  affect  Wt Readings from Last 3 Encounters:  07/15/23 179 lb 6.4 oz (81.4 kg)  05/27/23 181 lb (82.1 kg)  04/25/23 173 lb (78.5 kg)      Studies/Labs Reviewed:   Split-night sleep study and PAP compliance download  Recent Labs: 01/30/2023: ALT 25 02/03/2023: BUN 34; Creatinine, Ser 1.24; Hemoglobin 17.7; Platelets 315; Potassium 4.5; Sodium 133    CHA2DS2-VASc Score = 2   This indicates a 2.2% annual risk of stroke. The patient's score is based upon: CHF History: 0 HTN History: 1 Diabetes History: 0 Stroke History: 0 Vascular Disease History: 0 Age Score: 1 Gender Score: 0   Additional studies/ records that were reviewed today include:  none    ASSESSMENT:    1. OSA (obstructive sleep apnea)      PLAN:  In order of problems listed above:  OSA -He has severe OSA with an AHI of 50.8/hr and mild Central apnea with CSA 11.8/hr -he tried CPAP but is very intolerant and due to noncompliance he had to turn his device in>>he did not tolerate the masks -I think he would be an good candidate for the [facility] device.  He does have some central apneas but <25% -I will refer him to Dr. Jenne Pane to discussed the [facility] device  Time Spent: 20 minutes total time of encounter, including 15 minutes spent in face-to-face patient care on the date of this encounter. This time includes coordination of care and counseling regarding above mentioned problem list. Remainder of non-face-to-face time involved reviewing chart documents/testing relevant to the patient encounter and  documentation in the medical record. I have independently reviewed documentation from referring provider  Medication Adjustments/Labs and Tests Ordered: Current medicines are reviewed at length with the patient today.  Concerns regarding medicines are outlined above.  Medication changes, Labs and Tests ordered today are listed in the Patient Instructions below.  There are no Patient Instructions on file for this visit.   Signed, Armanda Magic, MD  07/15/2023 8:17 AM    [facility] [address] [place], [place], [place]  [address] Phone: [phone]; Fax: [phone]

## 2023-07-28 ENCOUNTER — Telehealth: Payer: Self-pay | Admitting: Cardiology

## 2023-07-28 NOTE — Telephone Encounter (Signed)
 Left message for patient informing him referral to Dr. Jenne Pane is in the system and currently pending review by Dr. Jenne Pane' office to determine scheduling need. Dr. Jenne Pane' office will contact patient to schedule appointment once review has been completed.  Provided office number for callback if any questions.

## 2023-07-28 NOTE — Telephone Encounter (Signed)
 Patient stated Dr. Jenne Pane office has not received the referral from Dr. Mayford Knife. Patient is requesting we refax the clearance to [phone]. Please advise.

## 2023-08-17 ENCOUNTER — Ambulatory Visit: Payer: Medicare Other | Admitting: Nurse Practitioner

## 2023-08-22 ENCOUNTER — Other Ambulatory Visit: Payer: Self-pay | Admitting: Otolaryngology

## 2023-09-01 ENCOUNTER — Ambulatory Visit (HOSPITAL_COMMUNITY): Admitting: Certified Registered"

## 2023-09-01 ENCOUNTER — Encounter (HOSPITAL_COMMUNITY): Payer: Self-pay | Admitting: Otolaryngology

## 2023-09-01 ENCOUNTER — Encounter (HOSPITAL_COMMUNITY)
Admission: RE | Disposition: A | Discharge status: Home / Self Care | Payer: Self-pay | Source: Home / Self Care | Attending: Otolaryngology

## 2023-09-01 ENCOUNTER — Other Ambulatory Visit: Payer: Self-pay

## 2023-09-01 ENCOUNTER — Ambulatory Visit (HOSPITAL_COMMUNITY)
Admission: RE | Admit: 2023-09-01 | Discharge: 2023-09-01 | Disposition: A | Discharge status: Home / Self Care | Attending: Otolaryngology | Admitting: Otolaryngology

## 2023-09-01 DIAGNOSIS — G4733 Obstructive sleep apnea (adult) (pediatric): Secondary | ICD-10-CM | POA: Diagnosis not present

## 2023-09-01 DIAGNOSIS — M1990 Unspecified osteoarthritis, unspecified site: Secondary | ICD-10-CM | POA: Insufficient documentation

## 2023-09-01 DIAGNOSIS — I2510 Atherosclerotic heart disease of native coronary artery without angina pectoris: Secondary | ICD-10-CM | POA: Diagnosis not present

## 2023-09-01 DIAGNOSIS — K219 Gastro-esophageal reflux disease without esophagitis: Secondary | ICD-10-CM | POA: Diagnosis not present

## 2023-09-01 DIAGNOSIS — Z6825 Body mass index (BMI) 25.0-25.9, adult: Secondary | ICD-10-CM | POA: Insufficient documentation

## 2023-09-01 HISTORY — PX: DRUG INDUCED ENDOSCOPY: SHX6808

## 2023-09-01 SURGERY — DRUG INDUCED SLEEP ENDOSCOPY
Anesthesia: Monitor Anesthesia Care | Laterality: Bilateral

## 2023-09-01 MED ORDER — PROPOFOL 500 MG/50ML IV EMUL
INTRAVENOUS | Status: DC | PRN
  Administered 2023-09-01: 40 mg via INTRAVENOUS
  Administered 2023-09-01: 100 ug/kg/min via INTRAVENOUS

## 2023-09-01 MED ORDER — SODIUM CHLORIDE 0.9 % IV SOLN: INTRAVENOUS | Status: DC | PRN

## 2023-09-01 MED ORDER — OXYMETAZOLINE HCL 0.05 % NA SOLN
NASAL | Status: DC | PRN
  Administered 2023-09-01: 1 via TOPICAL

## 2023-09-01 MED ORDER — OXYMETAZOLINE HCL 0.05 % NA SOLN
NASAL | Status: AC
  Filled 2023-09-01: qty 30

## 2023-09-01 NOTE — Op Note (Signed)
Preop diagnosis: Obstructive sleep apnea Postop diagnosis: same Procedure: Drug-induced sleep endoscopy Surgeon: Marissah Vandemark Anesth: IV sedation Compl: None Findings: There is 100% anterior-posterior collapse at the velum making him a candidate for hypoglossal nerve stimulator placement.  There was also marked anterior-posterior collapse at the tongue base. Description:  After discussing risks, benefits, and alternatives, the patient was brought to the operative suite and placed on the operative table in the supine position.  Anesthesia was induced and the patient was given light sedation to simulate natural sleep. When the proper level was reached, an Afrin-soaked pledget was placed in the right nasal passage for a couple of minutes and then removed.  The fiberoptic laryngoscope was then passed to view the pharynx and larynx.  Findings are noted above and the exam was recorded.  After completion, the scope was removed and the patient was returned to anesthesia for wakeup and was moved to the recovery room in stable condition.  

## 2023-09-01 NOTE — Anesthesia Preprocedure Evaluation (Signed)
 Anesthesia Evaluation  Patient identified by MRN, date of birth, ID band Patient awake    Reviewed: Allergy & Precautions, NPO status , Patient's Chart, lab work & pertinent test results  History of Anesthesia Complications Negative for: history of anesthetic complications  Airway Mallampati: III  TM Distance: >3 FB Neck ROM: Full    Dental no notable dental hx. (+) Teeth Intact, Dental Advisory Given   Pulmonary sleep apnea    Pulmonary exam normal breath sounds clear to auscultation       Cardiovascular Exercise Tolerance: Good (-) hypertension(-) angina + CAD  (-) Past MI (-) dysrhythmias  Rhythm:Regular Rate:Normal     Neuro/Psych negative neurological ROS  negative psych ROS   GI/Hepatic ,GERD  ,,  Endo/Other    Renal/GU      Musculoskeletal  (+) Arthritis ,    Abdominal   Peds  Hematology Lab Results      Component                Value               Date                      WBC                      14.3 (H)            02/03/2023                HGB                      17.7 (H)            02/03/2023                HCT                      53.2 (H)            02/03/2023                MCV                      82.1                02/03/2023                PLT                      315                 02/03/2023              Anesthesia Other Findings   Reproductive/Obstetrics                             Anesthesia Physical Anesthesia Plan  ASA: 2  Anesthesia Plan: MAC   Post-op Pain Management: Minimal or no pain anticipated   Induction: Intravenous  PONV Risk Score and Plan: Treatment may vary due to age or medical condition, Ondansetron  and Propofol  infusion  Airway Management Planned: Nasal Cannula and Natural Airway  Additional Equipment: None  Intra-op Plan:   Post-operative Plan:   Informed Consent: I have reviewed the patients History and Physical, chart, labs  and discussed the procedure including the risks, benefits and alternatives for the proposed anesthesia with  the patient or authorized representative who has indicated his/her understanding and acceptance.     Dental advisory given  Plan Discussed with:   Anesthesia Plan Comments:        Anesthesia Quick Evaluation

## 2023-09-01 NOTE — Anesthesia Postprocedure Evaluation (Signed)
 Anesthesia Post Note  Patient: [name] Le  Procedure(s) Performed: DRUG INDUCED SLEEP ENDOSCOPY (Bilateral)     Patient location during evaluation: Endoscopy Anesthesia Type: MAC Level of consciousness: awake and alert Pain management: pain level controlled Vital Signs Assessment: post-procedure vital signs reviewed and stable Respiratory status: spontaneous breathing, nonlabored ventilation, respiratory function stable and patient connected to nasal cannula oxygen Cardiovascular status: blood pressure returned to baseline and stable Postop Assessment: no apparent nausea or vomiting Anesthetic complications: no  No notable events documented.  Last Vitals:  Vitals:   09/01/23 1320 09/01/23 1330  BP: (!) 134/91 (!) 135/95  Pulse: (!) 58 (!) 55  Resp: 17 20  Temp:    SpO2: 96% 97%    Last Pain:  Vitals:   09/01/23 1330  TempSrc:   PainSc: 0-No pain                 Rosalita Combe

## 2023-09-01 NOTE — H&P (Signed)
 [name] Le is an 73 y.o. male.   Chief Complaint: Sleep apnea HPI: 73 year old male with sleep apnea who has been unable to tolerate CPAP.  Past Medical History:  Diagnosis Date   Atrial fibrillation (HCC)    Closed fracture of tuft of distal phalanx of finger 90's   left   Closed fracture of tuft of distal phalanx of right thumb with malunion 09/07/2012   ORIF with pin Filbert Huff)   ED (erectile dysfunction)    GERD (gastroesophageal reflux disease)    Lumbar degenerative disc disease    no problems that limit activity   MRSA (methicillin resistant staph aureus) culture positive 06/12/2012   left axilla    Past Surgical History:  Procedure Laterality Date   ABLATION  01/2023   CARDIOVERSION N/A 07/29/2022   Procedure: CARDIOVERSION;  Surgeon: Euell Herrlich, MD;  Location: [facility] ENDOSCOPY;  Service: Cardiovascular;  Laterality: N/A;   CARDIOVERSION N/A 09/23/2022   Procedure: CARDIOVERSION;  Surgeon: Loyde Rule, MD;  Location: [facility] INVASIVE CV LAB;  Service: Cardiovascular;  Laterality: N/A;   LACERATION REPAIR Left    chain laceration left forearm - 13 stitches.   TONSILLECTOMY     tuft fracture Left 09/07/2012   ORIF with PIN ( Grammig)    Family History  Problem Relation Age of Onset   Lung cancer Mother    Cancer Mother        lung with mets   Social History:  reports that he has never smoked. He has never used smokeless tobacco. He reports that he does not currently use alcohol. He reports that he does not use drugs.  Allergies: No Known Allergies  Medications Prior to Admission  Medication Sig Dispense Refill   acetaminophen  (TYLENOL ) 500 MG tablet Take 500 mg by mouth every 6 (six) hours as needed (pain.). Tylenol  Extra Strength Rapid Release Gels     amiodarone  (PACERONE ) 200 MG tablet Take 100 mg by mouth daily.     anastrozole (ARIMIDEX) 1 MG tablet Take 0.5 mg by mouth every 14 (fourteen) days.     apixaban  (ELIQUIS ) 5 MG TABS tablet Take 1 tablet (5  mg total) by mouth 2 (two) times daily. 60 tablet 6   diltiazem  (CARDIZEM ) 30 MG tablet Take by mouth.     famotidine  (PEPCID ) 20 MG tablet Take 40 mg by mouth every evening.     fexofenadine (ALLEGRA ODT) 30 MG disintegrating tablet Take 30 mg by mouth daily.     ibuprofen  (ADVIL ) 200 MG tablet Take by mouth. (Patient not taking: Reported on 09/01/2023)     oxycodone  (OXY-IR) 5 MG capsule Take 1 capsule (5 mg total) by mouth every 4 (four) hours as needed. (Patient not taking: Reported on 09/01/2023) 20 capsule 0   sildenafil (VIAGRA) 100 MG tablet Take 100 mg by mouth as needed for erectile dysfunction.     silodosin (RAPAFLO) 4 MG CAPS capsule Take 4 mg by mouth daily.      No results found for this or any previous visit (from the past 48 hours). No results found.  Review of Systems  All other systems reviewed and are negative.   There were no vitals taken for this visit. Physical Exam Constitutional:      Appearance: Normal appearance.  HENT:     Head: Normocephalic and atraumatic.     Right Ear: External ear normal.     Left Ear: External ear normal.     Nose: Nose normal.  Mouth/Throat:     Mouth: Mucous membranes are moist.     Pharynx: Oropharynx is clear.  Eyes:     Extraocular Movements: Extraocular movements intact.     Pupils: Pupils are equal, round, and reactive to light.  Cardiovascular:     Rate and Rhythm: Normal rate.  Pulmonary:     Effort: Pulmonary effort is normal.  Skin:    General: Skin is warm and dry.  Neurological:     General: No focal deficit present.     Mental Status: He is alert and oriented to person, place, and time.  Psychiatric:        Mood and Affect: Mood normal.        Behavior: Behavior normal.        Thought Content: Thought content normal.        Judgment: Judgment normal.      Assessment/Plan Obstructive sleep apnea and BMI 25.93  To OR for sleep endoscopy.  Virgina Grills, MD 09/01/2023, 12:15 PM

## 2023-09-01 NOTE — Transfer of Care (Signed)
 Immediate Anesthesia Transfer of Care Note  Patient: [name] Le  Procedure(s) Performed: DRUG INDUCED SLEEP ENDOSCOPY (Bilateral)  Patient Location: PACU  Anesthesia Type:General  Level of Consciousness: awake, alert , and patient cooperative  Airway & Oxygen Therapy: Patient Spontanous Breathing  Post-op Assessment: Report given to RN, Post -op Vital signs reviewed and stable, and Patient moving all extremities X 4  Post vital signs: Reviewed and stable  Last Vitals:  Vitals Value Taken Time  BP 140/87 09/01/23 1312  Temp WNL   Pulse 61 09/01/23 1315  Resp 18 09/01/23 1315  SpO2 97 % 09/01/23 1315  Vitals shown include unfiled device data.  Last Pain:  Vitals:   09/01/23 1216  TempSrc: Temporal  PainSc: 0-No pain         Complications: No notable events documented.

## 2023-09-04 ENCOUNTER — Encounter (HOSPITAL_COMMUNITY): Payer: Self-pay | Admitting: Otolaryngology

## 2023-09-20 ENCOUNTER — Other Ambulatory Visit: Payer: Self-pay | Admitting: Otolaryngology

## 2023-09-28 ENCOUNTER — Other Ambulatory Visit: Payer: Self-pay | Admitting: Otolaryngology

## 2023-10-06 NOTE — Progress Notes (Signed)
 Surgical Instructions   Your procedure is scheduled on October 13, 2023. Report to [facility] Main Entrance "A" at 10:15 A.M., then check in with the Admitting office. Any questions or running late day of surgery: call [phone]  Questions prior to your surgery date: call [phone], Monday-Friday, 8am-4pm. If you experience any cold or flu symptoms such as cough, fever, chills, shortness of breath, etc. between now and your scheduled surgery, please notify us  at the above number.     Remember:  Do not eat after midnight the night before your surgery   You may drink clear liquids until 9:15 the morning of your surgery.   Clear liquids allowed are: Water, Non-Citrus Juices (without pulp), Carbonated Beverages, Clear Tea (no milk, honey, etc.), Black Coffee Only (NO MILK, CREAM OR POWDERED CREAMER of any kind), and Gatorade.    Take these medicines the morning of surgery with A SIP OF WATER  amiodarone  (PACERONE )     May take these medicines IF NEEDED: acetaminophen  (TYLENOL )  famotidine  (PEPCID )  diltiazem  (CARDIZEM )  fexofenadine (ALLEGRA)   apixaban  (ELIQUIS ) HOLD 3 DAYS PRIOR TO PROCEDURE LAST DOSE 10-10-23   One week prior to surgery, STOP taking any Aspirin (unless otherwise instructed by your surgeon) Aleve, Naproxen, Ibuprofen , Motrin , Advil , Goody's, BC's, all herbal medications, fish oil, and non-prescription vitamins.                     Do NOT Smoke (Tobacco/Vaping) for 24 hours prior to your procedure.  If you use a CPAP at night, you may bring your mask/headgear for your overnight stay.   You will be asked to remove any contacts, glasses, piercing's, hearing aid's, dentures/partials prior to surgery. Please bring cases for these items if needed.    Patients discharged the day of surgery will not be allowed to drive home, and someone needs to stay with them for 24 hours.  SURGICAL WAITING ROOM VISITATION Patients may have no more than 2 support people in the  waiting area - these visitors may rotate.   Pre-op nurse will coordinate an appropriate time for 1 ADULT support person, who may not rotate, to accompany patient in pre-op.  Children under the age of 64 must have an adult with them who is not the patient and must remain in the main waiting area with an adult.  If the patient needs to stay at the hospital during part of their recovery, the visitor guidelines for inpatient rooms apply.  Please refer to the [facility] website for the visitor guidelines for any additional information.   If you received a COVID test during your pre-op visit  it is requested that you wear a mask when out in public, stay away from anyone that may not be feeling well and notify your surgeon if you develop symptoms. If you have been in contact with anyone that has tested positive in the last 10 days please notify you surgeon.      Pre-operative CHG Bathing Instructions   You can play a key role in reducing the risk of infection after surgery. Your skin needs to be as free of germs as possible. You can reduce the number of germs on your skin by washing with CHG (chlorhexidine gluconate) soap before surgery. CHG is an antiseptic soap that kills germs and continues to kill germs even after washing.   DO NOT use if you have an allergy to chlorhexidine/CHG or antibacterial soaps. If your skin becomes reddened or irritated, stop using  the CHG and notify one of our RNs at [phone].              TAKE A SHOWER THE NIGHT BEFORE SURGERY AND THE DAY OF SURGERY    Please keep in mind the following:  DO NOT shave, including legs and underarms, 48 hours prior to surgery.   You may shave your face before/day of surgery.  Place clean sheets on your bed the night before surgery Use a clean washcloth (not used since being washed) for each shower. DO NOT sleep with pet's night before surgery.  CHG Shower Instructions:  Wash your face and private area with normal soap. If you  choose to wash your hair, wash first with your normal shampoo.  After you use shampoo/soap, rinse your hair and body thoroughly to remove shampoo/soap residue.  Turn the water OFF and apply half the bottle of CHG soap to a CLEAN washcloth.  Apply CHG soap ONLY FROM YOUR NECK DOWN TO YOUR TOES (washing for 3-5 minutes)  DO NOT use CHG soap on face, private areas, open wounds, or sores.  Pay special attention to the area where your surgery is being performed.  If you are having back surgery, having someone wash your back for you may be helpful. Wait 2 minutes after CHG soap is applied, then you may rinse off the CHG soap.  Pat dry with a clean towel  Put on clean pajamas    Additional instructions for the day of surgery: DO NOT APPLY any lotions, deodorants, cologne, or perfumes.   Do not wear jewelry or makeup Do not wear nail polish, gel polish, artificial nails, or any other type of covering on natural nails (fingers and toes) Do not bring valuables to the hospital. [facility] is not responsible for valuables/personal belongings. Put on clean/comfortable clothes.  Please brush your teeth.  Ask your nurse before applying any prescription medications to the skin.

## 2023-10-07 ENCOUNTER — Other Ambulatory Visit: Payer: Self-pay

## 2023-10-07 ENCOUNTER — Encounter (HOSPITAL_COMMUNITY): Payer: Self-pay

## 2023-10-07 ENCOUNTER — Encounter (HOSPITAL_COMMUNITY)
Admission: RE | Admit: 2023-10-07 | Discharge: 2023-10-07 | Disposition: A | Discharge status: Home / Self Care | Source: Ambulatory Visit | Attending: Otolaryngology | Admitting: Otolaryngology

## 2023-10-07 VITALS — BP 129/86 | HR 71 | Temp 98.0°F | Resp 17 | Ht 69.0 in | Wt 174.7 lb

## 2023-10-07 DIAGNOSIS — Z7901 Long term (current) use of anticoagulants: Secondary | ICD-10-CM | POA: Diagnosis not present

## 2023-10-07 DIAGNOSIS — Z79899 Other long term (current) drug therapy: Secondary | ICD-10-CM | POA: Insufficient documentation

## 2023-10-07 DIAGNOSIS — Z01812 Encounter for preprocedural laboratory examination: Secondary | ICD-10-CM | POA: Insufficient documentation

## 2023-10-07 DIAGNOSIS — G4733 Obstructive sleep apnea (adult) (pediatric): Secondary | ICD-10-CM | POA: Diagnosis not present

## 2023-10-07 DIAGNOSIS — I4819 Other persistent atrial fibrillation: Secondary | ICD-10-CM | POA: Insufficient documentation

## 2023-10-07 DIAGNOSIS — Z01818 Encounter for other preprocedural examination: Secondary | ICD-10-CM

## 2023-10-07 HISTORY — DX: Sleep apnea, unspecified: G47.30

## 2023-10-07 LAB — CBC
HCT: 48.5 % (ref 39.0–52.0)
Hemoglobin: 15.7 g/dL (ref 13.0–17.0)
MCH: 26.4 pg (ref 26.0–34.0)
MCHC: 32.4 g/dL (ref 30.0–36.0)
MCV: 81.6 fL (ref 80.0–100.0)
Platelets: 298 10*3/uL (ref 150–400)
RBC: 5.94 MIL/uL — ABNORMAL HIGH (ref 4.22–5.81)
RDW: 18.6 % — ABNORMAL HIGH (ref 11.5–15.5)
WBC: 6.7 10*3/uL (ref 4.0–10.5)
nRBC: 0 % (ref 0.0–0.2)

## 2023-10-07 LAB — BASIC METABOLIC PANEL WITH GFR
Anion gap: 10 (ref 5–15)
BUN: 15 mg/dL (ref 8–23)
CO2: 23 mmol/L (ref 22–32)
Calcium: 9.3 mg/dL (ref 8.9–10.3)
Chloride: 106 mmol/L (ref 98–111)
Creatinine, Ser: 1.37 mg/dL — ABNORMAL HIGH (ref 0.61–1.24)
GFR, Estimated: 55 mL/min — ABNORMAL LOW (ref >60)
Glucose, Bld: 121 mg/dL — ABNORMAL HIGH (ref 70–99)
Potassium: 4.1 mmol/L (ref 3.5–5.1)
Sodium: 139 mmol/L (ref 135–145)

## 2023-10-07 NOTE — Progress Notes (Signed)
 PCP - Hoy Mackintosh. Cable,NP Cardiologist - Traci Turner,MD- new patient - seen 07/15/2023;discussed Inspire surgery. EP Cardiologist - Deyanne Forester Lewis,MD- [facility]- records requested from this office.   PPM/ICD - denies Device Orders -  Rep Notified -   Chest x-ray - 05/31/22 EKG - 02/03/23 Stress Test - denies ECHO - 08/13/22 Cardiac Cath - denies  Sleep Study - 09/13/22 +OSA CPAP - no  Fasting Blood Sugar - na Checks Blood Sugar _____ times a day  Last dose of GLP1 agonist-  na GLP1 instructions:   Blood Thinner Instructions:hold Eliquis  3 days prior to surgery. Pt states he was instructed by Dr. Tellis Feathers to take his last dose 6/2 am.  Aspirin Instructions:na  ERAS Protcol - clear liquids until 0915 PRE-SURGERY Ensure or G2- no  COVID TEST- na   Anesthesia review: yes - hx afib,ablation cardiac clearance  Patient denies shortness of breath, fever, cough and chest pain at PAT appointment   All instructions explained to the patient, with a verbal understanding of the material. Patient agrees to go over the instructions while at home for a better understanding. The opportunity to ask questions was provided.

## 2023-10-10 NOTE — Anesthesia Preprocedure Evaluation (Signed)
 Anesthesia Evaluation  Patient identified by MRN, date of birth, ID band Patient awake    Reviewed: Allergy & Precautions, H&P , NPO status , Patient's Chart, lab work & pertinent test results  History of Anesthesia Complications Negative for: history of anesthetic complications  Airway Mallampati: II   Neck ROM: full    Dental   Pulmonary sleep apnea    breath sounds clear to auscultation       Cardiovascular + dysrhythmias Atrial Fibrillation  Rhythm:regular Rate:Normal     Neuro/Psych negative neurological ROS  negative psych ROS   GI/Hepatic Neg liver ROS,GERD  Medicated,,  Endo/Other  negative endocrine ROS    Renal/GU Renal InsufficiencyRenal disease (Cr 1.37)  negative genitourinary   Musculoskeletal  (+) Arthritis ,    Abdominal   Peds  Hematology negative hematology ROS (+)   Anesthesia Other Findings Day of surgery medications reviewed with patient.  Reproductive/Obstetrics negative OB ROS                             Anesthesia Physical Anesthesia Plan  ASA: 2  Anesthesia Plan: General   Post-op Pain Management: Tylenol  PO (pre-op)*   Induction: Intravenous  PONV Risk Score and Plan: 2 and Ondansetron , Dexamethasone and Treatment may vary due to age or medical condition  Airway Management Planned: Oral ETT  Additional Equipment: None  Intra-op Plan:   Post-operative Plan: Extubation in OR  Informed Consent: I have reviewed the patients History and Physical, chart, labs and discussed the procedure including the risks, benefits and alternatives for the proposed anesthesia with the patient or authorized representative who has indicated his/her understanding and acceptance.     Dental advisory given  Plan Discussed with: CRNA, Anesthesiologist and Surgeon  Anesthesia Plan Comments: (PAT note by Rudy Costain, PA-C: 73 year old male follows with cardiology at [facility]  for history of OSA intolerant to CPAP, persistent AF s/p PVI ablation 01/2023 and subsequent DCCV 02/2023, maintained on Eliquis  and amiodarone , currently maintaining sinus rhythm.  He has as needed diltiazem  for rate control.  Last seen in follow-up on 09/06/2023, stable at that time, upcoming inspire implant discussed, no changes to management.  Other pertinent history includes GERD on H2 blocker.  Patient reports last dose of Eliquis  10/10/2023.  Preop labs reviewed, creatinine mildly elevated 1.37, otherwise unremarkable.  EKG 09/06/2023: NSR.  Rate 69.  Right atrial enlargement.  TEE 01/26/2023 (Care Everywhere): CONCLUSIONS ------------------------------------------------------------------  1. No LAA thrombus or mass  2. Mild MR and mild TR  3. Normal LV systolic function   TTE 08/13/2022: 1. Left ventricular ejection fraction, by estimation, is 55% with beat to  beat variability. The left ventricle has normal function. The left  ventricle has no regional wall motion abnormalities. There is moderate  asymmetric left ventricular hypertrophy  of the septal segment. Left ventricular diastolic parameters are  indeterminate.  2. Right ventricular systolic function is mildly reduced. The right  ventricular size is normal. There is normal pulmonary artery systolic  pressure. The estimated right ventricular systolic pressure is 23.8 mmHg.  3. Left atrial size was mildly dilated.  4. Right atrial size was mildly dilated.  5. The mitral valve is grossly normal. Mild mitral valve regurgitation.  No evidence of mitral stenosis.  6. Tricuspid valve regurgitation is mild to moderate.  7. The aortic valve is tricuspid. Aortic valve regurgitation is not  visualized. No aortic stenosis is present.  8. The inferior vena cava is  normal in size with greater than 50%  respiratory variability, suggesting right atrial pressure of 3 mmHg.   )        Anesthesia Quick Evaluation

## 2023-10-10 NOTE — Progress Notes (Signed)
 Anesthesia Chart Review:  73 year old male follows with cardiology at [facility] for history of OSA intolerant to CPAP, persistent AF s/p PVI ablation 01/2023 and subsequent DCCV 02/2023, maintained on Eliquis  and amiodarone , currently maintaining sinus rhythm.  He has as needed diltiazem  for rate control.  Last seen in follow-up on 09/06/2023, stable at that time, upcoming inspire implant discussed, no changes to management.  Other pertinent history includes GERD on H2 blocker.  Patient reports last dose of Eliquis  10/10/2023.  Preop labs reviewed, creatinine mildly elevated 1.37, otherwise unremarkable.  EKG 09/06/2023: NSR.  Rate 69.  Right atrial enlargement.  TEE 01/26/2023 (Care Everywhere): CONCLUSIONS ------------------------------------------------------------------    1. No LAA thrombus or mass    2. Mild MR and mild TR    3. Normal LV systolic function   TTE 08/13/2022: 1. Left ventricular ejection fraction, by estimation, is 55% with beat to  beat variability. The left ventricle has normal function. The left  ventricle has no regional wall motion abnormalities. There is moderate  asymmetric left ventricular hypertrophy  of the septal segment. Left ventricular diastolic parameters are  indeterminate.   2. Right ventricular systolic function is mildly reduced. The right  ventricular size is normal. There is normal pulmonary artery systolic  pressure. The estimated right ventricular systolic pressure is 23.8 mmHg.   3. Left atrial size was mildly dilated.   4. Right atrial size was mildly dilated.   5. The mitral valve is grossly normal. Mild mitral valve regurgitation.  No evidence of mitral stenosis.   6. Tricuspid valve regurgitation is mild to moderate.   7. The aortic valve is tricuspid. Aortic valve regurgitation is not  visualized. No aortic stenosis is present.   8. The inferior vena cava is normal in size with greater than 50%  respiratory variability, suggesting right atrial  pressure of 3 mmHg.     Edilia Gordon [facility] Short Stay [facility]/Anesthesiology Phone [phone] 10/10/2023 3:17 PM

## 2023-10-13 ENCOUNTER — Other Ambulatory Visit: Payer: Self-pay

## 2023-10-13 ENCOUNTER — Ambulatory Visit (HOSPITAL_BASED_OUTPATIENT_CLINIC_OR_DEPARTMENT_OTHER): Payer: Self-pay

## 2023-10-13 ENCOUNTER — Ambulatory Visit (HOSPITAL_COMMUNITY)
Admission: RE | Admit: 2023-10-13 | Discharge: 2023-10-13 | Disposition: A | Discharge status: Home / Self Care | Source: Ambulatory Visit | Attending: Otolaryngology | Admitting: Otolaryngology

## 2023-10-13 ENCOUNTER — Ambulatory Visit (HOSPITAL_COMMUNITY)

## 2023-10-13 ENCOUNTER — Ambulatory Visit (HOSPITAL_COMMUNITY): Payer: Self-pay | Admitting: Vascular Surgery

## 2023-10-13 ENCOUNTER — Encounter (HOSPITAL_COMMUNITY)
Admission: RE | Disposition: A | Discharge status: Home / Self Care | Payer: Self-pay | Source: Ambulatory Visit | Attending: Otolaryngology

## 2023-10-13 ENCOUNTER — Encounter (HOSPITAL_COMMUNITY): Payer: Self-pay | Admitting: Otolaryngology

## 2023-10-13 DIAGNOSIS — Z7901 Long term (current) use of anticoagulants: Secondary | ICD-10-CM | POA: Insufficient documentation

## 2023-10-13 DIAGNOSIS — K219 Gastro-esophageal reflux disease without esophagitis: Secondary | ICD-10-CM | POA: Insufficient documentation

## 2023-10-13 DIAGNOSIS — G4733 Obstructive sleep apnea (adult) (pediatric): Secondary | ICD-10-CM

## 2023-10-13 DIAGNOSIS — I4891 Unspecified atrial fibrillation: Secondary | ICD-10-CM | POA: Diagnosis not present

## 2023-10-13 DIAGNOSIS — E663 Overweight: Secondary | ICD-10-CM | POA: Insufficient documentation

## 2023-10-13 DIAGNOSIS — Z6825 Body mass index (BMI) 25.0-25.9, adult: Secondary | ICD-10-CM | POA: Insufficient documentation

## 2023-10-13 HISTORY — PX: IMPLANTATION OF HYPOGLOSSAL NERVE STIMULATOR: SHX6827

## 2023-10-13 SURGERY — INSERTION, HYPOGLOSSAL NERVE STIMULATOR
Anesthesia: General | Laterality: Bilateral

## 2023-10-13 MED ORDER — LIDOCAINE 2% (20 MG/ML) 5 ML SYRINGE
INTRAMUSCULAR | Status: AC
  Filled 2023-10-13: qty 5

## 2023-10-13 MED ORDER — HYDROCODONE-ACETAMINOPHEN 5-325 MG PO TABS: 1.0000 | ORAL_TABLET | Freq: Four times a day (QID) | ORAL | 0 refills | Status: DC | PRN

## 2023-10-13 MED ORDER — FENTANYL CITRATE (PF) 100 MCG/2ML IJ SOLN: 25.0000 ug | INTRAMUSCULAR | Status: DC | PRN

## 2023-10-13 MED ORDER — PROPOFOL 10 MG/ML IV BOLUS
INTRAVENOUS | Status: AC
  Filled 2023-10-13: qty 20

## 2023-10-13 MED ORDER — HYDRALAZINE HCL 20 MG/ML IJ SOLN
5.0000 mg | Freq: Once | INTRAMUSCULAR | Status: AC
  Administered 2023-10-13: 5 mg via INTRAVENOUS

## 2023-10-13 MED ORDER — SUCCINYLCHOLINE CHLORIDE 200 MG/10ML IV SOSY
PREFILLED_SYRINGE | INTRAVENOUS | Status: DC | PRN
  Administered 2023-10-13: 120 mg via INTRAVENOUS

## 2023-10-13 MED ORDER — MIDAZOLAM HCL 2 MG/2ML IJ SOLN
INTRAMUSCULAR | Status: AC
Start: 2023-10-13 — End: ?
  Filled 2023-10-13: qty 2

## 2023-10-13 MED ORDER — FENTANYL CITRATE (PF) 250 MCG/5ML IJ SOLN
INTRAMUSCULAR | Status: DC | PRN
  Administered 2023-10-13: 100 ug via INTRAVENOUS
  Administered 2023-10-13: 50 ug via INTRAVENOUS

## 2023-10-13 MED ORDER — PHENYLEPHRINE 80 MCG/ML (10ML) SYRINGE FOR IV PUSH (FOR BLOOD PRESSURE SUPPORT)
PREFILLED_SYRINGE | INTRAVENOUS | Status: AC
Start: 2023-10-13 — End: ?
  Filled 2023-10-13: qty 10

## 2023-10-13 MED ORDER — KETOROLAC TROMETHAMINE 30 MG/ML IJ SOLN
INTRAMUSCULAR | Status: AC
  Filled 2023-10-13: qty 1

## 2023-10-13 MED ORDER — PHENYLEPHRINE HCL-NACL 20-0.9 MG/250ML-% IV SOLN
INTRAVENOUS | Status: DC | PRN
  Administered 2023-10-13: 30 ug/min via INTRAVENOUS
  Administered 2023-10-13: 40 ug/min via INTRAVENOUS

## 2023-10-13 MED ORDER — ONDANSETRON HCL 4 MG/2ML IJ SOLN
INTRAMUSCULAR | Status: DC | PRN
  Administered 2023-10-13: 4 mg via INTRAVENOUS

## 2023-10-13 MED ORDER — DEXAMETHASONE SODIUM PHOSPHATE 10 MG/ML IJ SOLN
INTRAMUSCULAR | Status: AC
  Filled 2023-10-13: qty 1

## 2023-10-13 MED ORDER — LIDOCAINE 2% (20 MG/ML) 5 ML SYRINGE
INTRAMUSCULAR | Status: DC | PRN
  Administered 2023-10-13: 60 mg via INTRAVENOUS

## 2023-10-13 MED ORDER — ONDANSETRON HCL 4 MG/2ML IJ SOLN
INTRAMUSCULAR | Status: AC
  Filled 2023-10-13: qty 2

## 2023-10-13 MED ORDER — DEXAMETHASONE SODIUM PHOSPHATE 10 MG/ML IJ SOLN
INTRAMUSCULAR | Status: DC | PRN
  Administered 2023-10-13: 8 mg via INTRAVENOUS

## 2023-10-13 MED ORDER — LIDOCAINE-EPINEPHRINE 1 %-1:100000 IJ SOLN
INTRAMUSCULAR | Status: DC | PRN
  Administered 2023-10-13: 5.5 mL

## 2023-10-13 MED ORDER — HYDRALAZINE HCL 20 MG/ML IJ SOLN
INTRAMUSCULAR | Status: AC
  Filled 2023-10-13: qty 1

## 2023-10-13 MED ORDER — CEFAZOLIN SODIUM-DEXTROSE 2-4 GM/100ML-% IV SOLN
2.0000 g | INTRAVENOUS | Status: AC
  Administered 2023-10-13: 2 g via INTRAVENOUS
  Filled 2023-10-13: qty 100

## 2023-10-13 MED ORDER — LACTATED RINGERS IV SOLN: INTRAVENOUS | Status: DC

## 2023-10-13 MED ORDER — PROPOFOL 10 MG/ML IV BOLUS
INTRAVENOUS | Status: DC | PRN
  Administered 2023-10-13: 160 mg via INTRAVENOUS

## 2023-10-13 MED ORDER — OXYCODONE HCL 5 MG/5ML PO SOLN: 5.0000 mg | Freq: Once | ORAL | Status: DC | PRN

## 2023-10-13 MED ORDER — SODIUM CHLORIDE 0.9 % IV SOLN
0.0125 ug/kg/min | Freq: Once | INTRAVENOUS | Status: AC
  Administered 2023-10-13: .1 ug/kg/min via INTRAVENOUS
  Administered 2023-10-13: .12 ug/kg/min via INTRAVENOUS
  Filled 2023-10-13: qty 2000

## 2023-10-13 MED ORDER — FENTANYL CITRATE (PF) 250 MCG/5ML IJ SOLN
INTRAMUSCULAR | Status: AC
  Filled 2023-10-13: qty 5

## 2023-10-13 MED ORDER — KETOROLAC TROMETHAMINE 30 MG/ML IJ SOLN
30.0000 mg | Freq: Once | INTRAMUSCULAR | Status: AC
  Administered 2023-10-13: 30 mg via INTRAVENOUS

## 2023-10-13 MED ORDER — MIDAZOLAM HCL 2 MG/2ML IJ SOLN
INTRAMUSCULAR | Status: DC | PRN
  Administered 2023-10-13: 1 mg via INTRAVENOUS

## 2023-10-13 MED ORDER — EPHEDRINE 5 MG/ML INJ
INTRAVENOUS | Status: AC
  Filled 2023-10-13: qty 5

## 2023-10-13 MED ORDER — OXYCODONE HCL 5 MG PO TABS: 5.0000 mg | ORAL_TABLET | Freq: Once | ORAL | Status: DC | PRN

## 2023-10-13 MED ORDER — PHENYLEPHRINE HCL (PRESSORS) 10 MG/ML IV SOLN
INTRAVENOUS | Status: DC | PRN
  Administered 2023-10-13: 160 ug via INTRAVENOUS
  Administered 2023-10-13: 200 ug via INTRAVENOUS
  Administered 2023-10-13: 160 ug via INTRAVENOUS

## 2023-10-13 MED ORDER — CHLORHEXIDINE GLUCONATE 0.12 % MT SOLN
15.0000 mL | Freq: Once | OROMUCOSAL | Status: AC
  Administered 2023-10-13: 15 mL via OROMUCOSAL
  Filled 2023-10-13: qty 15

## 2023-10-13 MED ORDER — ORAL CARE MOUTH RINSE: 15.0000 mL | Freq: Once | OROMUCOSAL | Status: AC

## 2023-10-13 MED ORDER — SUCCINYLCHOLINE CHLORIDE 200 MG/10ML IV SOSY
PREFILLED_SYRINGE | INTRAVENOUS | Status: AC
  Filled 2023-10-13: qty 10

## 2023-10-13 MED ORDER — LIDOCAINE-EPINEPHRINE 1 %-1:100000 IJ SOLN
INTRAMUSCULAR | Status: AC
  Filled 2023-10-13: qty 1

## 2023-10-13 MED ORDER — DROPERIDOL 2.5 MG/ML IJ SOLN: 0.6250 mg | Freq: Once | INTRAMUSCULAR | Status: DC | PRN

## 2023-10-13 MED ORDER — ACETAMINOPHEN 500 MG PO TABS
1000.0000 mg | ORAL_TABLET | Freq: Once | ORAL | Status: AC
  Administered 2023-10-13: 1000 mg via ORAL
  Filled 2023-10-13: qty 2

## 2023-10-13 SURGICAL SUPPLY — 55 items
BAG COUNTER SPONGE SURGICOUNT (BAG) ×1 IMPLANT
BLADE CLIPPER SURG (BLADE) IMPLANT
BLADE SURG 15 STRL LF DISP TIS (BLADE) ×1 IMPLANT
CORD BIPOLAR FORCEPS 12FT (ELECTRODE) ×1 IMPLANT
COVER PROBE W GEL 5X96 (DRAPES) ×1 IMPLANT
COVER SURGICAL LIGHT HANDLE (MISCELLANEOUS) ×1 IMPLANT
DERMABOND ADVANCED .7 DNX12 (GAUZE/BANDAGES/DRESSINGS) ×1 IMPLANT
DRAPE C-ARM 35X43 STRL (DRAPES) ×1 IMPLANT
DRAPE HEAD BAR (DRAPES) ×1 IMPLANT
DRAPE INCISE IOBAN 66X45 STRL (DRAPES) ×1 IMPLANT
DRAPE MICROSCOPE LEICA 54X105 (DRAPES) ×1 IMPLANT
DRAPE UTILITY XL STRL (DRAPES) ×1 IMPLANT
DRSG TEGADERM 4X4.75 (GAUZE/BANDAGES/DRESSINGS) ×3 IMPLANT
ELECT COATED BLADE 2.86 ST (ELECTRODE) ×1 IMPLANT
ELECTRODE EMG 18 NIMS (NEUROSURGERY SUPPLIES) ×1 IMPLANT
ELECTRODE REM PT RTRN 9FT ADLT (ELECTROSURGICAL) ×1 IMPLANT
FORCEPS BIPOLAR SPETZLER 8 1.0 (NEUROSURGERY SUPPLIES) ×1 IMPLANT
GAUZE 4X4 16PLY ~~LOC~~+RFID DBL (SPONGE) ×1 IMPLANT
GAUZE SPONGE 4X4 12PLY STRL (GAUZE/BANDAGES/DRESSINGS) ×1 IMPLANT
GENERATOR PULSE INSPIRE (Generator) ×1 IMPLANT
GENERATOR PULSE INSPIRE IV (Generator) ×1 IMPLANT
GLOVE BIO SURGEON STRL SZ7.5 (GLOVE) ×1 IMPLANT
GOWN STRL REUS W/ TWL LRG LVL3 (GOWN DISPOSABLE) ×3 IMPLANT
KIT BASIN OR (CUSTOM PROCEDURE TRAY) ×1 IMPLANT
KIT NEURO ACCESSORY W/WRENCH (MISCELLANEOUS) IMPLANT
KIT TURNOVER KIT B (KITS) ×1 IMPLANT
LEAD SENSING RESP INSPIRE (Lead) ×1 IMPLANT
LEAD SENSING RESP INSPIRE IV (Lead) ×1 IMPLANT
LEAD SLEEP STIM INSPIRE IV/V (Lead) ×1 IMPLANT
LEAD SLEEP STIMULATION INSPIRE (Lead) ×1 IMPLANT
LOOP VASCLR MAXI BLUE 18IN ST (MISCELLANEOUS) ×1 IMPLANT
LOOPS VASCLR MAXI BLUE 18IN ST (MISCELLANEOUS) ×1 IMPLANT
MARKER SKIN DUAL TIP RULER LAB (MISCELLANEOUS) ×2 IMPLANT
NDL HYPO 25GX1X1/2 BEV (NEEDLE) ×1 IMPLANT
NEEDLE HYPO 25GX1X1/2 BEV (NEEDLE) ×1 IMPLANT
NS IRRIG 1000ML POUR BTL (IV SOLUTION) ×1 IMPLANT
PAD ARMBOARD POSITIONER FOAM (MISCELLANEOUS) ×1 IMPLANT
PASSER CATH 38CM DISP (INSTRUMENTS) ×1 IMPLANT
PENCIL SMOKE EVACUATOR (MISCELLANEOUS) ×1 IMPLANT
POSITIONER HEAD DONUT 9IN (MISCELLANEOUS) ×1 IMPLANT
PROBE NERVE STIMULATOR (NEUROSURGERY SUPPLIES) ×1 IMPLANT
REMOTE CONTROL SLEEP INSPIRE (MISCELLANEOUS) ×1 IMPLANT
SET WALTER ACTIVATION W/DRAPE (SET/KITS/TRAYS/PACK) ×1 IMPLANT
SPONGE INTESTINAL PEANUT (DISPOSABLE) ×1 IMPLANT
SUT SILK 2 0 SH (SUTURE) ×1 IMPLANT
SUT SILK 3 0 REEL (SUTURE) ×1 IMPLANT
SUT SILK 3 0 SH 30 (SUTURE) ×2 IMPLANT
SUT VIC AB 3-0 SH 27X BRD (SUTURE) ×2 IMPLANT
SUT VIC AB 4-0 PS2 27 (SUTURE) ×2 IMPLANT
SUTURE SILK 3-0 RB1 30XBRD (SUTURE) ×1 IMPLANT
SYR 10ML LL (SYRINGE) ×1 IMPLANT
TAPE CLOTH SURG 4X10 WHT LF (GAUZE/BANDAGES/DRESSINGS) ×1 IMPLANT
TOWEL GREEN STERILE (TOWEL DISPOSABLE) ×1 IMPLANT
TRAY ENT MC OR (CUSTOM PROCEDURE TRAY) ×1 IMPLANT
VASCULAR TIE MINI RED 18IN STL (MISCELLANEOUS) ×1 IMPLANT

## 2023-10-13 NOTE — H&P (Signed)
 [name] Le is an 73 y.o. male.   Chief Complaint: Sleep apnea HPI: 73 year old male with sleep apnea who has been unable to tolerate CPAP.  Past Medical History:  Diagnosis Date   Atrial fibrillation (HCC)    Closed fracture of tuft of distal phalanx of finger 90's   left   Closed fracture of tuft of distal phalanx of right thumb with malunion 09/07/2012   ORIF with pin Filbert Huff)   ED (erectile dysfunction)    GERD (gastroesophageal reflux disease)    Lumbar degenerative disc disease    no problems that limit activity   MRSA (methicillin resistant staph aureus) culture positive 06/12/2012   left axilla   Sleep apnea     Past Surgical History:  Procedure Laterality Date   ABLATION  01/2023   CARDIOVERSION N/A 07/29/2022   Procedure: CARDIOVERSION;  Surgeon: Euell Herrlich, MD;  Location: [facility] ENDOSCOPY;  Service: Cardiovascular;  Laterality: N/A;   CARDIOVERSION N/A 09/23/2022   Procedure: CARDIOVERSION;  Surgeon: Loyde Rule, MD;  Location: [facility] INVASIVE CV LAB;  Service: Cardiovascular;  Laterality: N/A;   DRUG INDUCED ENDOSCOPY Bilateral 09/01/2023   Procedure: DRUG INDUCED SLEEP ENDOSCOPY;  Surgeon: Virgina Grills, MD;  Location: [facility] ENDOSCOPY;  Service: ENT;  Laterality: Bilateral;   LACERATION REPAIR Left    chain laceration left forearm - 13 stitches.   TONSILLECTOMY     tuft fracture Left 09/07/2012   ORIF with PIN ( Grammig)    Family History  Problem Relation Age of Onset   Lung cancer Mother    Cancer Mother        lung with mets   Social History:  reports that he has never smoked. He has never used smokeless tobacco. He reports that he does not currently use alcohol. He reports that he does not use drugs.  Allergies: No Known Allergies  Medications Prior to Admission  Medication Sig Dispense Refill   acetaminophen  (TYLENOL ) 500 MG tablet Take 500 mg by mouth every 6 (six) hours as needed for moderate pain (pain score 4-6).     amiodarone  (PACERONE ) 200  MG tablet Take 100 mg by mouth daily.     anastrozole (ARIMIDEX) 1 MG tablet Take 0.5 mg by mouth every 14 (fourteen) days.     apixaban  (ELIQUIS ) 5 MG TABS tablet Take 1 tablet (5 mg total) by mouth 2 (two) times daily. 60 tablet 6   diltiazem  (CARDIZEM ) 30 MG tablet Take 30 mg by mouth daily as needed (AFIB).     famotidine  (PEPCID ) 20 MG tablet Take 40 mg by mouth every evening.     fexofenadine (ALLEGRA) 180 MG tablet Take 180 mg by mouth daily.     sildenafil (VIAGRA) 100 MG tablet Take 100 mg by mouth as needed for erectile dysfunction.     oxycodone  (OXY-IR) 5 MG capsule Take 1 capsule (5 mg total) by mouth every 4 (four) hours as needed. (Patient not taking: Reported on 09/01/2023) 20 capsule 0   silodosin (RAPAFLO) 4 MG CAPS capsule Take 4 mg by mouth daily.      No results found for this or any previous visit (from the past 48 hours). No results found.  Review of Systems  All other systems reviewed and are negative.   Blood pressure (!) 136/90, pulse 68, temperature 98.4 F (36.9 C), temperature source Oral, resp. rate 18, height 5\' 9"  (1.753 m), weight 79.2 kg, SpO2 97%. Physical Exam Constitutional:      Appearance:  Normal appearance. He is normal weight.  HENT:     Head: Normocephalic and atraumatic.     Right Ear: External ear normal.     Left Ear: External ear normal.     Nose: Nose normal.     Mouth/Throat:     Mouth: Mucous membranes are moist.     Pharynx: Oropharynx is clear.  Eyes:     Extraocular Movements: Extraocular movements intact.     Pupils: Pupils are equal, round, and reactive to light.  Cardiovascular:     Rate and Rhythm: Normal rate.  Pulmonary:     Effort: Pulmonary effort is normal.  Skin:    General: Skin is warm and dry.  Neurological:     General: No focal deficit present.     Mental Status: He is alert and oriented to person, place, and time.  Psychiatric:        Mood and Affect: Mood normal.        Behavior: Behavior normal.         Thought Content: Thought content normal.        Judgment: Judgment normal.      Assessment/Plan Obstructive sleep apnea and BMI 25.80  To OR for hypoglossal nerve stimulator placement.  Virgina Grills, MD 10/13/2023, 12:00 PM

## 2023-10-13 NOTE — Anesthesia Procedure Notes (Signed)
 Procedure Name: Intubation Date/Time: 10/13/2023 12:54 PM  Performed by: Candance Certain, CRNAPre-anesthesia Checklist: Patient identified, Emergency Drugs available, Suction available and Patient being monitored Patient Re-evaluated:Patient Re-evaluated prior to induction Oxygen Delivery Method: Circle System Utilized Preoxygenation: Pre-oxygenation with 100% oxygen Induction Type: IV induction Laryngoscope Size: Glidescope and 3 Grade View: Grade II Tube type: Oral Number of attempts: 1 Airway Equipment and Method: Oral airway Placement Confirmation: ETT inserted through vocal cords under direct vision, positive ETCO2 and breath sounds checked- equal and bilateral Secured at: 23 cm Tube secured with: Tape Dental Injury: Teeth and Oropharynx as per pre-operative assessment  Comments: Atraumatic induction/intubation. Dentition and oral mucosa as per preop.

## 2023-10-13 NOTE — Op Note (Signed)

## 2023-10-13 NOTE — Transfer of Care (Signed)
 Immediate Anesthesia Transfer of Care Note  Patient: [name] Le  Procedure(s) Performed: INSERTION, HYPOGLOSSAL NERVE STIMULATOR (Bilateral)  Patient Location: PACU  Anesthesia Type:General  Level of Consciousness: awake, alert , and oriented  Airway & Oxygen Therapy: Patient Spontanous Breathing and Patient connected to face mask oxygen  Post-op Assessment: Report given to RN and Post -op Vital signs reviewed and stable  Post vital signs: Reviewed and stable  Last Vitals:  Vitals Value Taken Time  BP 168/106 10/13/23 1441  Temp    Pulse 66 10/13/23 1443  Resp 17 10/13/23 1443  SpO2 99 % 10/13/23 1443  Vitals shown include unfiled device data.  Last Pain:  Vitals:   10/13/23 1042  TempSrc:   PainSc: 0-No pain         Complications: No notable events documented.

## 2023-10-14 ENCOUNTER — Encounter (HOSPITAL_COMMUNITY): Payer: Self-pay | Admitting: Otolaryngology

## 2023-10-14 NOTE — Anesthesia Postprocedure Evaluation (Signed)
 Anesthesia Post Note  Patient: [name] Le  Procedure(s) Performed: INSERTION, HYPOGLOSSAL NERVE STIMULATOR (Bilateral)     Patient location during evaluation: PACU Anesthesia Type: General Level of consciousness: awake and alert Pain management: pain level controlled Vital Signs Assessment: post-procedure vital signs reviewed and stable Respiratory status: spontaneous breathing, nonlabored ventilation, respiratory function stable and patient connected to nasal cannula oxygen Cardiovascular status: blood pressure returned to baseline and stable Postop Assessment: no apparent nausea or vomiting Anesthetic complications: no   No notable events documented.  Last Vitals:  Vitals:   10/13/23 1600 10/13/23 1615  BP: (!) 168/94 (!) 155/93  Pulse: 60 64  Resp: 12 12  Temp:  36.6 C  SpO2: 95% 96%    Last Pain:  Vitals:   10/13/23 1615  TempSrc:   PainSc: 1                  Starling Christofferson S

## 2023-11-28 ENCOUNTER — Encounter: Payer: Self-pay | Admitting: Pulmonary Disease

## 2023-11-28 ENCOUNTER — Encounter (HOSPITAL_BASED_OUTPATIENT_CLINIC_OR_DEPARTMENT_OTHER): Payer: Self-pay | Admitting: Pulmonary Disease

## 2023-11-28 ENCOUNTER — Ambulatory Visit (HOSPITAL_BASED_OUTPATIENT_CLINIC_OR_DEPARTMENT_OTHER): Admitting: Pulmonary Disease

## 2023-11-28 VITALS — BP 115/81 | Ht 69.0 in | Wt 169.0 lb

## 2023-11-28 DIAGNOSIS — Z9682 Presence of neurostimulator: Secondary | ICD-10-CM

## 2023-11-28 DIAGNOSIS — G4733 Obstructive sleep apnea (adult) (pediatric): Secondary | ICD-10-CM

## 2023-11-28 NOTE — Patient Instructions (Signed)
    Your device was activated today Start delay 17m Pause time 15 m Duration 8h  You were set at 0.6 V= level 1 Increase by 1 level every week until tongue discomfort  Try to use every night , ALL night

## 2023-11-28 NOTE — Progress Notes (Signed)
 New Patient Pulmonology Office Visit   Subjective:  Patient ID: [name] Le, male    DOB: [birth year]  MRN: [id number]  Referred by: Wendee Lynwood HERO, NP  CC:  Chief Complaint  Patient presents with   Establish Care    New Patient     Implantation 10/13/23  Discussed the use of AI scribe software for clinical note transcription with the patient, who gave verbal consent to proceed.  History of Present Illness [name] Le is a 73 year old male with sleep apnea who presents for [facility] activation. He was referred by Dr. Shlomo for evaluation and management of sleep apnea.  He has sleep apnea and was unable to tolerate CPAP therapy. A sleep study was conducted, and an endoscopy was performed about a month ago. He is awaiting Inspire activation and has not yet received the remote control for the device.  He has atrial fibrillation and is on amiodarone  and a blood thinner. He has been in rhythm since an ablation procedure, except for one incident of arrhythmia post-procedure, which was resolved with cardioversion. No further arrhythmias have occurred.  His sleep patterns include going to bed between 7:30 and 9:00 PM and waking up early between 2:30 and 4:30 AM. He has difficulty returning to sleep due to work-related thoughts. He occasionally naps on weekends. There has been some improvement in his sleep pattern since the ablation and Inspire implantation, though he still wakes up multiple times a night.    Bedtime 9pm, latency 15 mins, awake @ 4-30 am ESS2  Significant tests/ events reviewed  09/2022 split night >>severe obstructive sleep apnea with an AHI 50.8/h and mild central sleep apnea with a central AHI of 11.8/h >> CPAP 14 cm  ROS  Constitutional: negative for anorexia, fevers and sweats  Eyes: negative for irritation, redness and visual disturbance  Ears, nose, mouth, throat, and face: negative for earaches, epistaxis, nasal congestion and sore throat  Respiratory:  negative for cough, dyspnea on exertion, sputum and wheezing  Cardiovascular: negative for chest pain, dyspnea, lower extremity edema, orthopnea, palpitations and syncope  Gastrointestinal: negative for abdominal pain, constipation, diarrhea, melena, nausea and vomiting  Genitourinary:negative for dysuria, frequency and hematuria  Hematologic/lymphatic: negative for bleeding, easy bruising and lymphadenopathy  Musculoskeletal:negative for arthralgias, muscle weakness and stiff joints  Neurological: negative for coordination problems, gait problems, headaches and weakness  Endocrine: negative for diabetic symptoms including polydipsia, polyuria and weight loss   Allergies: Patient has no known allergies.  Current Outpatient Medications:    acetaminophen  (TYLENOL ) 500 MG tablet, Take 500 mg by mouth every 6 (six) hours as needed for moderate pain (pain score 4-6)., Disp: , Rfl:    amiodarone  (PACERONE ) 200 MG tablet, Take 100 mg by mouth daily., Disp: , Rfl:    anastrozole (ARIMIDEX) 1 MG tablet, Take 0.5 mg by mouth every 14 (fourteen) days., Disp: , Rfl:    apixaban  (ELIQUIS ) 5 MG TABS tablet, Take 1 tablet (5 mg total) by mouth 2 (two) times daily., Disp: 60 tablet, Rfl: 6   diltiazem  (CARDIZEM ) 30 MG tablet, Take 30 mg by mouth daily as needed (AFIB)., Disp: , Rfl:    famotidine  (PEPCID ) 20 MG tablet, Take 40 mg by mouth every evening., Disp: , Rfl:    fexofenadine (ALLEGRA) 180 MG tablet, Take 180 mg by mouth daily., Disp: , Rfl:    HYDROcodone -acetaminophen  (NORCO/VICODIN) 5-325 MG tablet, Take 1-2 tablets by mouth every 6 (six) hours as needed., Disp: 12 tablet, Rfl: 0  sildenafil (VIAGRA) 100 MG tablet, Take 100 mg by mouth as needed for erectile dysfunction., Disp: , Rfl:    silodosin (RAPAFLO) 4 MG CAPS capsule, Take 4 mg by mouth daily., Disp: , Rfl:    oxycodone  (OXY-IR) 5 MG capsule, Take 1 capsule (5 mg total) by mouth every 4 (four) hours as needed. (Patient not taking: Reported  on 11/28/2023), Disp: 20 capsule, Rfl: 0 Past Medical History:  Diagnosis Date   Atrial fibrillation (HCC)    Closed fracture of tuft of distal phalanx of finger 90's   left   Closed fracture of tuft of distal phalanx of right thumb with malunion 09/07/2012   ORIF with pin Cinthia)   ED (erectile dysfunction)    GERD (gastroesophageal reflux disease)    Lumbar degenerative disc disease    no problems that limit activity   MRSA (methicillin resistant staph aureus) culture positive 06/12/2012   left axilla   Sleep apnea    Past Surgical History:  Procedure Laterality Date   ABLATION  01/2023   CARDIOVERSION N/A 07/29/2022   Procedure: CARDIOVERSION;  Surgeon: Loni Soyla LABOR, MD;  Location: [facility] ENDOSCOPY;  Service: Cardiovascular;  Laterality: N/A;   CARDIOVERSION N/A 09/23/2022   Procedure: CARDIOVERSION;  Surgeon: Delford Maude BROCKS, MD;  Location: [facility] INVASIVE CV LAB;  Service: Cardiovascular;  Laterality: N/A;   DRUG INDUCED ENDOSCOPY Bilateral 09/01/2023   Procedure: DRUG INDUCED SLEEP ENDOSCOPY;  Surgeon: Carlie Clark, MD;  Location: [facility] ENDOSCOPY;  Service: ENT;  Laterality: Bilateral;   IMPLANTATION OF HYPOGLOSSAL NERVE STIMULATOR Bilateral 10/13/2023   Procedure: INSERTION, HYPOGLOSSAL NERVE STIMULATOR;  Surgeon: Carlie Clark, MD;  Location: [facility] OR;  Service: ENT;  Laterality: Bilateral;   LACERATION REPAIR Left    chain laceration left forearm - 13 stitches.   TONSILLECTOMY     tuft fracture Left 09/07/2012   ORIF with PIN ( Grammig)   Family History  Problem Relation Age of Onset   Lung cancer Mother    Cancer Mother        lung with mets   Social History   Socioeconomic History   Marital status: Married    Spouse name: Grayce   Number of children: 2   Years of education: 12   Highest education level: Not on file  Occupational History   Occupation: hospital gas systems  Tobacco Use   Smoking status: Never   Smokeless tobacco: Never   Tobacco comments:    Never  smoke 08/06/22  Vaping Use   Vaping status: Never Used  Substance and Sexual Activity   Alcohol use: Not Currently   Drug use: No   Sexual activity: Yes    Partners: Female  Other Topics Concern   Not on file  Social History Narrative   HSG, 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 Work - Psychiatrist. Marriage in good health.   Social Drivers of Corporate investment banker Strain: Low Risk  (05/27/2023)   Overall Financial Resource Strain (CARDIA)    Difficulty of Paying Living Expenses: Not hard at all  Food Insecurity: No Food Insecurity (05/27/2023)   Hunger Vital Sign    Worried About Running Out of Food in the Last Year: Never true    Ran Out of Food in the Last Year: Never true  Transportation Needs: No Transportation Needs (05/27/2023)   PRAPARE - Administrator, Civil Service (  Medical): No    Lack of Transportation (Non-Medical): No  Physical Activity: Sufficiently Active (05/27/2023)   Exercise Vital Sign    Days of Exercise per Week: 4 days    Minutes of Exercise per Session: 60 min  Stress: No Stress Concern Present (05/27/2023)   [employer] of Occupational Health - Occupational Stress Questionnaire    Feeling of Stress : Not at all  Social Connections: Moderately Isolated (05/27/2023)   Social Connection and Isolation Panel    Frequency of Communication with Friends and Family: More than three times a week    Frequency of Social Gatherings with Friends and Family: Once a week    Attends Religious Services: Never    Database administrator or Organizations: No    Attends Banker Meetings: Never    Marital Status: Married  Catering manager Violence: Not At Risk (05/27/2023)   Humiliation, Afraid, Rape, and Kick questionnaire    Fear of Current or Ex-Partner: No    Emotionally Abused: No    Physically Abused: No    Sexually  Abused: No       Objective:  BP 115/81 (BP Location: Left Arm, Patient Position: Sitting, Cuff Size: Normal)   Ht 1' (0.305 m)   Wt 169 lb (76.7 kg)   SpO2 96%   BMI 825.14 kg/m    Physical Exam  Gen. Pleasant, well-nourished, in no distress, normal affect ENT - no pallor,icterus, no post nasal drip Neck: No JVD, no thyromegaly, no carotid bruits Lungs: no use of accessory muscles, no dullness to percussion, clear without rales or rhonchi  Cardiovascular: Rhythm regular, heart sounds  normal, no murmurs or gallops, no peripheral edema Abdomen: soft and non-tender, no hepatosplenomegaly, BS normal. Musculoskeletal: No deformities, no cyanosis or clubbing Neuro:  alert, non focal       Assessment & Plan:  Assessment and Plan Assessment & Plan Sleep Apnea Sleep apnea is managed with the Inspire device following difficulty with CPAP. Post-surgery, he reports no issues with swallowing or tongue movement. Awaiting remote for the Inspire device and has installed the app on his smartphone. Familiarizing himself with the technology through instructional videos. - Provide remote for Inspire device. - Ensure app is installed on smartphone. - Review instructional videos for Inspire device. - Post activation visit in one month.  Hypoglossal nerve stimulator was reassessed today.  Goal of the follow-up visit was to ensure good compliance, good subjective benefit, good tongue motion and good sense lead waveforms .incision sites appear good, tongue protrusion was examined.   Programming :  1.  Stimulation level : Sensation 0.4 V , functional level 0.6 V lower limit, upper limit 1.6 V 2.  Start delay was , pause time 15 minutes, duration 8 hours 3.  Sensing waveform was analyzed for 3 minutes 4.  Sleep remote education was provided patient demonstrated competency with the remote and was aware of patient Instruction videos and sleep remote guide 5.  Patient was instructed to step up  levels by 1 level (0.1 V ) every week    Atrial Fibrillation Atrial fibrillation is well-controlled with amiodarone  and anticoagulation. He remains in rhythm with no recent arrhythmia episodes. A previous arrhythmia episode post-ablation due to constipation resolved after CT scan and shock treatment.       No follow-ups on file.   Harden ROCKFORD Jude, MD

## 2023-11-28 NOTE — Progress Notes (Signed)

## 2023-12-08 ENCOUNTER — Ambulatory Visit
Admission: RE | Admit: 2023-12-08 | Discharge: 2023-12-08 | Disposition: A | Discharge status: Home / Self Care | Attending: Physician Assistant | Admitting: Physician Assistant

## 2023-12-08 VITALS — BP 142/87 | HR 60 | Temp 98.1°F | Resp 18

## 2023-12-08 DIAGNOSIS — S0101XA Laceration without foreign body of scalp, initial encounter: Secondary | ICD-10-CM | POA: Diagnosis not present

## 2023-12-08 DIAGNOSIS — Z23 Encounter for immunization: Secondary | ICD-10-CM

## 2023-12-08 MED ORDER — TETANUS-DIPHTH-ACELL PERTUSSIS 5-2.5-18.5 LF-MCG/0.5 IM SUSY
0.5000 mL | PREFILLED_SYRINGE | Freq: Once | INTRAMUSCULAR | Status: AC
  Administered 2023-12-08: 0.5 mL via INTRAMUSCULAR

## 2023-12-08 NOTE — ED Triage Notes (Signed)
 Pt states while at work 6:30am this morning lifted his head up and cut the top of his head on a metal cabinet. 2 small lacs noted. Bleeding controlled. Denies LOC.

## 2023-12-08 NOTE — Discharge Instructions (Addendum)
 You were seen today for concerns of a laceration to your scalp.  We have cleansed the wound and applied a medical grade skin glue, called Dermabond, to the area to help close the wound and provide protection from contamination. We have also provided you with a tetanus booster. The Dermabond will come off on its own so you do not need to return to urgent care unless you have concerns or complications. Please try to keep from submerging your head or soaking it in water as this can cause the Dermabond to come off too soon.  The Dermabond should come off in the next 5 to 7 days without intervention.  It is okay to wash the area with warm water and gentle soap but do not scrub or scratch the area. You can take Tylenol  as needed for pain management. If you develop any of the following please return to urgent care or go to the emergency room for further evaluation: Severe swelling of the scalp, severe bleeding, drainage that looks like pus, severe pain, headaches, vision changes, confusion or loss of consciousness.

## 2023-12-08 NOTE — ED Provider Notes (Signed)
 [name]    CSN: [id number] Arrival date & time: 12/08/23  1700      History   Chief Complaint Chief Complaint  Patient presents with   Laceration    HPI [name] Le is a 73 y.o. male.   HPI Pt is here for concerns of laceration to the head He reports he stood up and hit his head on a metal box. He denies headache, vision changes, LOC, lightheadedness, nausea, vomiting.  He reports this happened around 6:30 AM and he completed his work day without issue He reports that he has a hx of A fib and is on eliquis  so there was some initial bleeding but this has stopped He reports mild tenderness around the affected area but no generalized headache   He thinks it has been over 5 years since he received a tetanus booster but is not sure about exact timeframe. He is amenable to receiving a booster today    Past Medical History:  Diagnosis Date   Atrial fibrillation (HCC)    Closed fracture of tuft of distal phalanx of finger 90's   left   Closed fracture of tuft of distal phalanx of right thumb with malunion 09/07/2012   ORIF with pin Cinthia)   ED (erectile dysfunction)    GERD (gastroesophageal reflux disease)    Lumbar degenerative disc disease    no problems that limit activity   MRSA (methicillin resistant staph aureus) culture positive 06/12/2012   left axilla   Sleep apnea     Patient Active Problem List   Diagnosis Date Noted   Atrial fibrillation (HCC) 07/29/2022   Encounter to establish care 11/08/2016   Impingement syndrome of left shoulder 11/08/2016   Routine health maintenance 11/13/2012   Lumbar degenerative disc disease    GERD (gastroesophageal reflux disease)    MRSA (methicillin resistant staph aureus) culture positive 06/12/2012    Past Surgical History:  Procedure Laterality Date   ABLATION  01/2023   CARDIOVERSION N/A 07/29/2022   Procedure: CARDIOVERSION;  Surgeon: Loni Soyla DELENA, MD;  Location: [facility] ENDOSCOPY;  Service:  Cardiovascular;  Laterality: N/A;   CARDIOVERSION N/A 09/23/2022   Procedure: CARDIOVERSION;  Surgeon: Delford Maude BROCKS, MD;  Location: [facility] INVASIVE CV LAB;  Service: Cardiovascular;  Laterality: N/A;   DRUG INDUCED ENDOSCOPY Bilateral 09/01/2023   Procedure: DRUG INDUCED SLEEP ENDOSCOPY;  Surgeon: Carlie Clark, MD;  Location: [facility] ENDOSCOPY;  Service: ENT;  Laterality: Bilateral;   IMPLANTATION OF HYPOGLOSSAL NERVE STIMULATOR Bilateral 10/13/2023   Procedure: INSERTION, HYPOGLOSSAL NERVE STIMULATOR;  Surgeon: Carlie Clark, MD;  Location: [facility] OR;  Service: ENT;  Laterality: Bilateral;   LACERATION REPAIR Left    chain laceration left forearm - 13 stitches.   TONSILLECTOMY     tuft fracture Left 09/07/2012   ORIF with PIN ( Grammig)       Home Medications    Prior to Admission medications   Medication Sig Start Date End Date Taking? Authorizing Provider  acetaminophen  (TYLENOL ) 500 MG tablet Take 500 mg by mouth every 6 (six) hours as needed for moderate pain (pain score 4-6).    [provider]  amiodarone  (PACERONE ) 200 MG tablet Take 100 mg by mouth daily. 12/18/22   [provider]  anastrozole (ARIMIDEX) 1 MG tablet Take 0.5 mg by mouth every 14 (fourteen) days.    [provider]  apixaban  (ELIQUIS ) 5 MG TABS tablet Take 1 tablet (5 mg total) by mouth 2 (two) times daily. 10/25/22 11/28/23  Mealor, [name], MD  diltiazem  (CARDIZEM ) 30 MG tablet Take 30 mg by mouth daily as needed (AFIB). 01/29/23 01/29/24  [provider]  famotidine  (PEPCID ) 20 MG tablet Take 40 mg by mouth every evening.    [provider]  fexofenadine (ALLEGRA) 180 MG tablet Take 180 mg by mouth daily.    [provider]  sildenafil (VIAGRA) 100 MG tablet Take 100 mg by mouth as needed for erectile dysfunction. 04/19/22   [provider]  silodosin (RAPAFLO) 4 MG CAPS capsule Take 4 mg by mouth daily. 02/02/23   [provider]    Family  History Family History  Problem Relation Age of Onset   Lung cancer Mother    Cancer Mother        lung with mets    Social History Social History   Tobacco Use   Smoking status: Never   Smokeless tobacco: Never   Tobacco comments:    Never smoke 08/06/22  Vaping Use   Vaping status: Never Used  Substance Use Topics   Alcohol use: Not Currently   Drug use: No     Allergies   Patient has no known allergies.   Review of Systems Review of Systems  Eyes:  Negative for visual disturbance.  Gastrointestinal:  Negative for nausea and vomiting.  Musculoskeletal:  Negative for neck pain and neck stiffness.  Skin:  Positive for wound.  Neurological:  Negative for dizziness, syncope, speech difficulty, weakness, light-headedness and headaches.  Psychiatric/Behavioral:  Negative for confusion.      Physical Exam Triage Vital Signs ED Triage Vitals  Encounter Vitals Group     BP 12/08/23 1724 (!) 142/87     [name] Systolic BP Percentile --      [name] Diastolic BP Percentile --      Boys Systolic BP Percentile --      Boys Diastolic BP Percentile --      Pulse Rate 12/08/23 1724 60     Resp 12/08/23 1724 18     Temp 12/08/23 1724 98.1 F (36.7 C)     Temp Source 12/08/23 1724 Oral     SpO2 12/08/23 1724 96 %     Weight --      Height --      Head Circumference --      Peak Flow --      Pain Score 12/08/23 1725 1     Pain Loc --      Pain Education --      Exclude from Growth Chart --    No data found.  Updated Vital Signs BP (!) 142/87 (BP Location: Right Arm)   Pulse 60   Temp 98.1 F (36.7 C) (Oral)   Resp 18   SpO2 96%   Visual Acuity Right Eye Distance:   Left Eye Distance:   Bilateral Distance:    Right Eye Near:   Left Eye Near:    Bilateral Near:     Physical Exam Vitals reviewed.  Constitutional:      General: He is awake.     Appearance: Normal appearance. He is well-developed and well-groomed.  HENT:     Head: Normocephalic and  atraumatic.   Eyes:     Extraocular Movements: Extraocular movements intact.     Conjunctiva/sclera: Conjunctivae normal.  Pulmonary:     Effort: Pulmonary effort is normal.  Musculoskeletal:     Cervical back: Normal range of motion.  Neurological:     Mental Status: He  is alert and oriented to person, place, and time.  Psychiatric:        Attention and Perception: Attention normal.        Mood and Affect: Mood normal.        Speech: Speech normal.        Behavior: Behavior normal. Behavior is cooperative.      UC Treatments / Results  Labs (all labs ordered are listed, but only abnormal results are displayed) Labs Reviewed - No data to display  EKG   Radiology No results found.  Procedures Laceration Repair  Date/Time: 12/08/2023 6:33 PM  Performed by: Marylene Rocky BRAVO, PA-C Authorized by: Sonnie Bias E, PA-C   Consent:    Consent obtained:  Verbal   Consent given by:  Patient   Risks, benefits, and alternatives were discussed: yes     Risks discussed:  Pain, infection, need for additional repair and poor wound healing   Alternatives discussed:  No treatment and observation Universal protocol:    Procedure explained and questions answered to patient or proxy's satisfaction: yes     Patient identity confirmed:  Verbally with patient Anesthesia:    Anesthesia method:  None Laceration details:    Location:  Scalp   Scalp location:  Crown   Length (cm):  1   Depth (mm):  2 Pre-procedure details:    Preparation:  Patient was prepped and draped in usual sterile fashion Exploration:    Limited defect created (wound extended): no     Hemostasis achieved with:  Direct pressure   Wound exploration: entire depth of wound visualized     Contaminated: no   Treatment:    Wound cleansed with: Wound cleansed with spray wound cleanser.   Amount of cleaning:  Standard   Debridement:  None   Undermining:  None Skin repair:    Repair method:  Tissue  adhesive Approximation:    Approximation:  Close Repair type:    Repair type:  Simple Post-procedure details:    Dressing:  Open (no dressing)   Procedure completion:  Tolerated well, no immediate complications  (including critical care time)  Medications Ordered in UC Medications  Tdap (BOOSTRIX) injection 0.5 mL (0.5 mLs Intramuscular Given 12/08/23 1824)    Initial Impression / Assessment and Plan / UC Course  I have reviewed the triage vital signs and the nursing notes.  Pertinent labs & imaging results that were available during my care of the patient were reviewed by me and considered in my medical decision making (see chart for details).      Final Clinical Impressions(s) / UC Diagnoses   Final diagnoses:  Scalp laceration, initial encounter   Patient presents today with concerns for minor scalp lacerations following hitting his head earlier today.  He reports that he stood up and struck his head on a metal toolbox causing 2 small lacerations to the scalp.  Physical exam reveals approximately 1 cm long laceration along the right side of the crown of the head as well as approximately 5 mm long laceration along the left side of the crown of the head.  Both lacerations have clotted at time of appointment.  Lacerations were cleansed using dermal wound cleanser allowing for full visualization of the wound bed.  There is slight active bleeding during exam but no significant tenderness or surrounding erythema.  Patient is amenable to closing the laceration on the right side of the crown with Dermabond.  Left-sided laceration was not closed due to size and  shallow nature of wound.  Patient's most recent tetanus booster appears to have been administered in 2013 but patient is unsure if this is correct so we will provide new booster today.  Home care measures discussed and reviewed with patient as well as provided in after visit summary.  ED and return precautions reviewed and provided in  after visit summary.  Follow-up as needed    Discharge Instructions      You were seen today for concerns of a laceration to your scalp.  We have cleansed the wound and applied a medical grade skin glue, called Dermabond, to the area to help close the wound and provide protection from contamination. We have also provided you with a tetanus booster. The Dermabond will come off on its own so you do not need to return to urgent care unless you have concerns or complications. Please try to keep from submerging your head or soaking it in water as this can cause the Dermabond to come off too soon.  The Dermabond should come off in the next 5 to 7 days without intervention.  It is okay to wash the area with warm water and gentle soap but do not scrub or scratch the area. You can take Tylenol  as needed for pain management. If you develop any of the following please return to urgent care or go to the emergency room for further evaluation: Severe swelling of the scalp, severe bleeding, drainage that looks like pus, severe pain, headaches, vision changes, confusion or loss of consciousness.     ED Prescriptions   None    PDMP not reviewed this encounter.   Marylene Rocky FORBES DEVONNA 12/08/23 [id number]

## 2023-12-29 ENCOUNTER — Ambulatory Visit (HOSPITAL_BASED_OUTPATIENT_CLINIC_OR_DEPARTMENT_OTHER): Admitting: Pulmonary Disease

## 2023-12-29 ENCOUNTER — Encounter (HOSPITAL_BASED_OUTPATIENT_CLINIC_OR_DEPARTMENT_OTHER): Payer: Self-pay | Admitting: Pulmonary Disease

## 2023-12-29 VITALS — BP 132/80 | HR 69 | Ht 69.0 in | Wt 178.1 lb

## 2023-12-29 DIAGNOSIS — G4733 Obstructive sleep apnea (adult) (pediatric): Secondary | ICD-10-CM

## 2023-12-29 DIAGNOSIS — Z9682 Presence of neurostimulator: Secondary | ICD-10-CM | POA: Diagnosis not present

## 2023-12-29 NOTE — Patient Instructions (Signed)
  VISIT SUMMARY: You had a follow-up visit to discuss your progress with the hypoglossal nerve stimulator for your obstructive sleep apnea. You have been adjusting well to the device and have experienced improved sleep quality and reduced daytime sleepiness, although you still wake up at night to use the bathroom, which disrupts your sleep.  YOUR PLAN: -OBSTRUCTIVE SLEEP APNEA: Obstructive sleep apnea is a condition where your airway becomes blocked during sleep, causing breathing pauses. You have a hypoglossal nerve stimulator implanted to help manage this condition. You should increase the stimulation level by one level every Monday and monitor how well you tolerate it. If a level feels too strong, reduce it to the previous level, wait two to three nights, and then try the higher level again. We will conduct a readiness check in four weeks and schedule a sleep study in October to determine the optimal stimulation level.  INSTRUCTIONS: Please follow the plan to increase the stimulation level by one level every Monday, monitoring for tolerance. If a level is too strong, reduce to the previous level, wait two to three nights, and attempt the higher level again. We will conduct a readiness check in four weeks. Additionally, a sleep study is scheduled for October to determine the optimal stimulation level.                      Contains text generated by [employer]bridge.                                 Contains text generated by Abridge.

## 2023-12-29 NOTE — Progress Notes (Signed)
 Subjective:    Patient ID: [name] Le, male    DOB: 19-Sep-1950, 73 y.o.   MRN: [id number]   73 year old male with OSA s/p inspire device   PMH : Atrial fib s/p ablation  Activation 11/28/23 Sensation 0.4 V , functional level 0.6 V lower limit, upper limit 1.6 V Start delay was , pause time 15 minutes, duration 8 hours  Discussed the use of AI scribe software for clinical note transcription with the patient, who gave verbal consent to proceed.  History of Present Illness  Discussed the use of AI scribe software for clinical note transcription with the patient, who gave verbal consent to proceed.  History of Present Illness   [name] Le is a 73 year old male with obstructive sleep apnea who presents for a post-activation visit of his hypoglossal nerve stimulator.  He initially did not pause the device when waking at night, which disrupted his sleep, but this has been corrected. He experiences improved sleep quality when using the device as recommended. He consistently wakes between 12:00 and 12:15 AM to use the bathroom, which disrupts his sleep. Initially, he experienced tongue twitching when the device was set from level two to three, disturbing his sleep. He adjusted the setting back to two, then gradually increased to three and now to four without issues. He currently uses the device at level four, corresponding to 0.9 volts, without adverse effects. He feels more rested during the day, although he still experiences fatigue after nights of poor sleep.        Significant tests/ events reviewed   09/2022 split night >>severe obstructive sleep apnea with an AHI 50.8/h and mild central sleep apnea with a central AHI of 11.8/h >> CPAP 14 cm   Review of Systems  neg for any significant sore throat, dysphagia, itching, sneezing, nasal congestion or excess/ purulent secretions, fever, chills, sweats, unintended wt loss, pleuritic or exertional cp, hempoptysis, orthopnea pnd  or change in chronic leg swelling. Also denies presyncope, palpitations, heartburn, abdominal pain, nausea, vomiting, diarrhea or change in bowel or urinary habits, dysuria,hematuria, rash, arthralgias, visual complaints, headache, numbness weakness or ataxia.      Objective:   Physical Exam  Gen. Pleasant, well-nourished, in no distress ENT - no thrush, no pallor/icterus,no post nasal drip Neck: No JVD, no thyromegaly, no carotid bruits Lungs: no use of accessory muscles, no dullness to percussion, clear without rales or rhonchi  Cardiovascular: Rhythm regular, heart sounds  normal, no murmurs or gallops, no peripheral edema Musculoskeletal: No deformities, no cyanosis or clubbing        Assessment & Plan:   Assessment and Plan Assessment & Plan  Assessment and Plan    Obstructive sleep apnea status post hypoglossal nerve stimulator implantation Status post implantation of a hypoglossal nerve stimulator for obstructive sleep apnea. Initially experienced difficulty adjusting to the device, particularly when forgetting to pause it during nighttime awakenings, affecting his ability to return to sleep. Gradually increased the stimulation level, currently at level four (0.9 volts). Reports improved restfulness and reduced daytime sleepiness, although occasionally experiences difficulty sleeping due to external stressors. Tolerates the current level well without adverse effects such as excessive tongue movement that would prevent sleep. - Increase stimulation level by one level every Monday, monitoring for tolerance. - If a level is too strong, reduce to the previous level, wait two to three nights, and attempt the higher level again. - Conduct a readiness check in four weeks. - Schedule a  sleep study in October to determine the optimal stimulation level.

## 2024-01-19 ENCOUNTER — Ambulatory Visit (HOSPITAL_BASED_OUTPATIENT_CLINIC_OR_DEPARTMENT_OTHER): Admitting: Student

## 2024-01-19 DIAGNOSIS — M170 Bilateral primary osteoarthritis of knee: Secondary | ICD-10-CM | POA: Diagnosis not present

## 2024-01-19 DIAGNOSIS — M25561 Pain in right knee: Secondary | ICD-10-CM

## 2024-01-19 DIAGNOSIS — M25562 Pain in left knee: Secondary | ICD-10-CM

## 2024-01-19 MED ORDER — TRIAMCINOLONE ACETONIDE 40 MG/ML IJ SUSP
2.0000 mL | INTRAMUSCULAR | Status: AC | PRN
  Administered 2024-01-19: 2 mL via INTRA_ARTICULAR

## 2024-01-19 MED ORDER — LIDOCAINE HCL 1 % IJ SOLN
4.0000 mL | INTRAMUSCULAR | Status: AC | PRN
  Administered 2024-01-19: 4 mL

## 2024-01-19 NOTE — Progress Notes (Signed)
 Chief Complaint: Bilateral knee pain     History of Present Illness:    [name] Le is a 73 y.o. male who presents today for evaluation of bilateral knee pain.  Patient has been seen many times last year in our clinic for recurrent left knee infrapatellar bursitis, however does also have known underlying knee arthritis bilaterally.  He does report some swelling on the left knee however not as much as previous.  His main concern is just the pain in both knees, which is slightly worse on the left than right.  Pain is significantly worse with weightbearing.   Surgical History:   None  PMH/PSH/Family History/Social History/Meds/Allergies:    Past Medical History:  Diagnosis Date   Atrial fibrillation (HCC)    Closed fracture of tuft of distal phalanx of finger 90's   left   Closed fracture of tuft of distal phalanx of right thumb with malunion 09/07/2012   ORIF with pin Cinthia)   ED (erectile dysfunction)    GERD (gastroesophageal reflux disease)    Lumbar degenerative disc disease    no problems that limit activity   MRSA (methicillin resistant staph aureus) culture positive 06/12/2012   left axilla   Sleep apnea    Past Surgical History:  Procedure Laterality Date   ABLATION  01/2023   CARDIOVERSION N/A 07/29/2022   Procedure: CARDIOVERSION;  Surgeon: Loni Soyla DELENA, MD;  Location: [facility] ENDOSCOPY;  Service: Cardiovascular;  Laterality: N/A;   CARDIOVERSION N/A 09/23/2022   Procedure: CARDIOVERSION;  Surgeon: Delford Maude BROCKS, MD;  Location: [facility] INVASIVE CV LAB;  Service: Cardiovascular;  Laterality: N/A;   DRUG INDUCED ENDOSCOPY Bilateral 09/01/2023   Procedure: DRUG INDUCED SLEEP ENDOSCOPY;  Surgeon: Carlie Clark, MD;  Location: [facility] ENDOSCOPY;  Service: ENT;  Laterality: Bilateral;   IMPLANTATION OF HYPOGLOSSAL NERVE STIMULATOR Bilateral 10/13/2023   Procedure: INSERTION, HYPOGLOSSAL NERVE STIMULATOR;  Surgeon: Carlie Clark, MD;  Location:  [facility] OR;  Service: ENT;  Laterality: Bilateral;   LACERATION REPAIR Left    chain laceration left forearm - 13 stitches.   TONSILLECTOMY     tuft fracture Left 09/07/2012   ORIF with PIN ( [place])   Social History   Socioeconomic History   Marital status: Married    Spouse name: Grayce   Number of children: 2   Years of education: 12   Highest education level: Not on file  Occupational History   Occupation: hospital gas systems  Tobacco Use   Smoking status: Never   Smokeless tobacco: Never   Tobacco comments:    Never smoke 08/06/22  Vaping Use   Vaping status: Never Used  Substance and Sexual Activity   Alcohol use: Not Currently   Drug use: No   Sexual activity: Yes    Partners: Female  Other Topics Concern   Not on file  Social History Narrative   HSG, 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 Work - Psychiatrist. Marriage in good health.   Social Drivers of Corporate investment banker Strain: Low Risk  (05/27/2023)   Overall Financial Resource Strain (CARDIA)    Difficulty of Paying Living Expenses: Not hard at all  Food Insecurity: No Food Insecurity (05/27/2023)   Hunger  Vital Sign    Worried About Programme researcher, broadcasting/film/video in the Last Year: Never true    Ran Out of Food in the Last Year: Never true  Transportation Needs: No Transportation Needs (05/27/2023)   PRAPARE - Administrator, Civil Service (Medical): No    Lack of Transportation (Non-Medical): No  Physical Activity: Sufficiently Active (05/27/2023)   Exercise Vital Sign    Days of Exercise per Week: 4 days    Minutes of Exercise per Session: 60 min  Stress: No Stress Concern Present (05/27/2023)   [employer] of Occupational Health - Occupational Stress Questionnaire    Feeling of Stress : Not at all  Social Connections: Moderately Isolated (05/27/2023)   Social Connection and  Isolation Panel    Frequency of Communication with Friends and Family: More than three times a week    Frequency of Social Gatherings with Friends and Family: Once a week    Attends Religious Services: Never    Database administrator or Organizations: No    Attends Engineer, structural: Never    Marital Status: Married   Family History  Problem Relation Age of Onset   Lung cancer Mother    Cancer Mother        lung with mets   No Known Allergies Current Outpatient Medications  Medication Sig Dispense Refill   acetaminophen  (TYLENOL ) 500 MG tablet Take 500 mg by mouth every 6 (six) hours as needed for moderate pain (pain score 4-6).     amiodarone  (PACERONE ) 200 MG tablet Take 100 mg by mouth daily.     anastrozole (ARIMIDEX) 1 MG tablet Take 0.5 mg by mouth every 14 (fourteen) days.     apixaban  (ELIQUIS ) 5 MG TABS tablet Take 1 tablet (5 mg total) by mouth 2 (two) times daily. 60 tablet 6   diltiazem  (CARDIZEM ) 30 MG tablet Take 30 mg by mouth daily as needed (AFIB).     famotidine  (PEPCID ) 20 MG tablet Take 40 mg by mouth every evening.     fexofenadine (ALLEGRA) 180 MG tablet Take 180 mg by mouth daily.     sildenafil (VIAGRA) 100 MG tablet Take 100 mg by mouth as needed for erectile dysfunction.     silodosin (RAPAFLO) 4 MG CAPS capsule Take 4 mg by mouth daily.     No current facility-administered medications for this visit.   No results found.  Review of Systems:   A ROS was performed including pertinent positives and negatives as documented in the HPI.  Physical Exam :   Constitutional: NAD and appears stated age Neurological: Alert and oriented Psych: Appropriate affect and cooperative There were no vitals taken for this visit.   Comprehensive Musculoskeletal Exam:    Exam of bilateral knees demonstrates active range of motion from 0 to 120 degrees with minimal crepitus.  There is some infrapatellar bursal fluctuance without overlying erythema or warmth.   Joint line tenderness is noted bilaterally particularly in the medial compartments.  No varus or valgus laxity.  Imaging:     Assessment:   73 y.o. male with bilateral knee arthritis that is more advanced in the right knee, although at this time his left knee is slightly more symptomatic.  There is an element of infrapatellar bursitis in the left knee which has been chronic, although this appears mild today.  I have recommended continued compression and ice for this.  Patient would like to proceed with cortisone injections of both knees which  I am agreeable to.  Injections were performed without complication.  We did also discuss potential to submit for gel injection authorization, so patient will let us  know if he would like to proceed with this, particularly around the time the cortisone begins losing effectiveness.  Plan :    - Bilateral cortisone injections performed today - Return to clinic as needed and consider authorization for viscosupplementation injections    Procedure Note  Patient: [name] Le             Date of Birth: Dec 18, 1950           MRN: [id number]             Visit Date: 01/19/2024  Procedures: Visit Diagnoses:  1. Bilateral primary osteoarthritis of knee     Large Joint Inj: bilateral knee on 01/19/2024 6:02 PM Indications: pain Details: 22 G 1.5 in needle, anterolateral approach Medications (Right): 4 mL lidocaine  1 %; 2 mL triamcinolone  acetonide 40 MG/ML Medications (Left): 4 mL lidocaine  1 %; 2 mL triamcinolone  acetonide 40 MG/ML Outcome: tolerated well, no immediate complications Procedure, treatment alternatives, risks and benefits explained, specific risks discussed. Consent was given by the patient. Immediately prior to procedure a time out was called to verify the correct patient, procedure, equipment, support staff and site/side marked as required. Patient was prepped and draped in the usual sterile fashion.        I personally saw and evaluated  the patient, and participated in the management and treatment plan.  Leonce Reveal, PA-C Orthopedics

## 2024-01-27 ENCOUNTER — Encounter: Payer: Self-pay | Admitting: Adult Health

## 2024-01-27 ENCOUNTER — Ambulatory Visit: Admitting: Adult Health

## 2024-01-27 VITALS — BP 122/74 | HR 76 | Ht 69.0 in | Wt 177.0 lb

## 2024-01-27 DIAGNOSIS — R400 Somnolence: Secondary | ICD-10-CM | POA: Diagnosis not present

## 2024-01-27 DIAGNOSIS — R0683 Snoring: Secondary | ICD-10-CM | POA: Diagnosis not present

## 2024-01-27 DIAGNOSIS — G4733 Obstructive sleep apnea (adult) (pediatric): Secondary | ICD-10-CM | POA: Diagnosis not present

## 2024-01-27 NOTE — Patient Instructions (Signed)
 Continue on Inspire each night  Keep up good work.  We adjusted your level today -Level 8 (1.3v)  Can increase by 1 level each week as comfortable Sleep titration study as planned next month  Follow up as planned in November and As needed

## 2024-01-27 NOTE — Progress Notes (Signed)
 @Patient  ID: [name] Le, male    DOB: 07/26/1950, 73 y.o.   MRN: [id number]  Chief Complaint  Patient presents with   Sleep Apnea    Referring provider: Wendee Lynwood HERO, NP  HPI: 73 year old male followed for obstructive sleep apnea-CPAP intolerant status post inspire device Medical history significant for atrial fibs status post ablation  TEST/EVENTS :  Inspire implant October 13, 2023 Activation November 28, 2023  Home sleep study AHI 50/hour May 2024  01/27/2024 Follow up : OSA /INSPIRE  Discussed the use of AI scribe software for clinical note transcription with the patient, who gave verbal consent to proceed.  History of Present Illness [name] Le is a 73 year old male with severe sleep apnea who presents for a one-month follow-up for obstructive sleep apnea status post Inspire device implantation.  He has a history of severe sleep apnea,. He was unable to tolerate CPAP therapy, leading to the decision to implant the Inspire device in June, which was activated in July.  His snoring has improved, as noted by his wife, and he feels that his sleep quality is gradually getting better. Initially, he experienced some difficulty adjusting to the device settings, particularly at level seven, which he found a bit strong.  He notes a significant reduction in nighttime awakenings, previously getting up three to four times a night, but now occasionally sleeping through the night. Daytime sleepiness has also improved, although he still experiences some tiredness depending on his daily activities. He mentions that he used to come home from work very tired, but this has improved.  He is currently using the Inspire device at level seven, which corresponds to 1.2 volts. Stimulation test today in the office shows good tongue movement and perceived comfort Inspire compliance report shows 100% usage.  Daily average usage at 7 hours.  Currently on 1.2 V with range at 0.6 to 1.6 V.  Waveform today in  the office appears normal Currently 30 minutes start delay, 15-minute pause and 8-hour duration    No Known Allergies  Immunization History  Administered Date(s) Administered   Tdap 08/14/2011, 12/08/2023    Past Medical History:  Diagnosis Date   Atrial fibrillation (HCC)    Closed fracture of tuft of distal phalanx of finger 90's   left   Closed fracture of tuft of distal phalanx of right thumb with malunion 09/07/2012   ORIF with pin Cinthia)   ED (erectile dysfunction)    GERD (gastroesophageal reflux disease)    Lumbar degenerative disc disease    no problems that limit activity   MRSA (methicillin resistant staph aureus) culture positive 06/12/2012   left axilla   Sleep apnea     Tobacco History: Social History   Tobacco Use  Smoking Status Never  Smokeless Tobacco Never  Tobacco Comments   Never smoke 08/06/22   Counseling given: Not Answered Tobacco comments: Never smoke 08/06/22   Outpatient Medications Prior to Visit  Medication Sig Dispense Refill   acetaminophen  (TYLENOL ) 500 MG tablet Take 500 mg by mouth every 6 (six) hours as needed for moderate pain (pain score 4-6).     amiodarone  (PACERONE ) 200 MG tablet Take 100 mg by mouth daily.     anastrozole (ARIMIDEX) 1 MG tablet Take 0.5 mg by mouth every 14 (fourteen) days.     apixaban  (ELIQUIS ) 5 MG TABS tablet Take 1 tablet (5 mg total) by mouth 2 (two) times daily. 60 tablet 6   diltiazem  (CARDIZEM ) 30 MG tablet  Take 30 mg by mouth daily as needed (AFIB).     famotidine  (PEPCID ) 20 MG tablet Take 40 mg by mouth every evening.     fexofenadine (ALLEGRA) 180 MG tablet Take 180 mg by mouth daily.     sildenafil (VIAGRA) 100 MG tablet Take 100 mg by mouth as needed for erectile dysfunction.     silodosin (RAPAFLO) 4 MG CAPS capsule Take 4 mg by mouth daily.     No facility-administered medications prior to visit.     Review of Systems:   Constitutional:   No  weight loss, night sweats,  Fevers,  chills, fatigue, or  lassitude.  HEENT:   No headaches,  Difficulty swallowing,  Tooth/dental problems, or  Sore throat,                No sneezing, itching, ear ache, nasal congestion, post nasal drip,   CV:  No chest pain,  Orthopnea, PND, swelling in lower extremities, anasarca, dizziness, palpitations, syncope.   GI  No heartburn, indigestion, abdominal pain, nausea, vomiting, diarrhea, change in bowel habits, loss of appetite, bloody stools.   Resp: No shortness of breath with exertion or at rest.  No excess mucus, no productive cough,  No non-productive cough,  No coughing up of blood.  No change in color of mucus.  No wheezing.  No chest wall deformity  Skin: no rash or lesions.  GU: no dysuria, change in color of urine, no urgency or frequency.  No flank pain, no hematuria   MS:  No joint pain or swelling.  No decreased range of motion.  No back pain.    Physical Exam  BP 122/74 (BP Location: Left Arm, Patient Position: Sitting, Cuff Size: Normal)   Pulse 76   Ht 5' 9 (1.753 m)   Wt 177 lb (80.3 kg)   SpO2 100% Comment: RA  BMI 26.14 kg/m   GEN: A/Ox3; pleasant , NAD, well nourished    HEENT:  [place]/AT,   NOSE-clear, THROAT-clear, no lesions, no postnasal drip or exudate noted.   NECK:  Supple w/ fair ROM; no JVD; normal carotid impulses w/o bruits; no thyromegaly or nodules palpated; no lymphadenopathy.    RESP  Clear  P & A; w/o, wheezes/ rales/ or rhonchi. no accessory muscle use, no dullness to percussion  CARD:  RRR, no m/r/g, no peripheral edema, pulses intact, no cyanosis or clubbing.  GI:   Soft & nt; nml bowel sounds; no organomegaly or masses detected.   Musco: Warm bil, no deformities or joint swelling noted.   Neuro: alert, no focal deficits noted.    Skin: Warm, no lesions or rashes    Lab Results:      BNP   ProBNP No results found for: PROBNP  Imaging: No results found.     No results found for:  NITRICOXIDE      Assessment & Plan:   No problem-specific Assessment & Plan notes found for this encounter.  Assessment and Plan Assessment & Plan Severe obstructive sleep apnea   CPAP intolerance status post inspire implantation CPAP was intolerable, leading to [facility] device implantation in June 2025  and activation in July 2025 . Currently set at level 7 (1.2 volts), stimulation test patient increased to level 8 at 1.3 V .  Patient has perceived benefit .symptoms have improved, including reduced snoring and decreased restlessness. The goal is to fine-tune voltage for optimal control.  Increase the device to level 8 (1.3 volts) and instruct to increase  voltage weekly as comfortable until the sleep study. Conduct a sleep titration study on October 9 to adjust voltage for optimal control.  Snoring associated with obstructive sleep apnea   Snoring has improved with Inspire therapy, as reported by his wife.   Daytime sleepiness related to obstructive sleep apnea   Daytime sleepiness has improved since starting Inspire therapy, with fewer instances of waking up tired or feeling sleepy during the day. Continued improvement is expected with further optimization of device settings. Monitor daytime sleepiness and adjust settings as needed.  Plan  Patient Instructions  Continue on Inspire each night  Keep up good work.  We adjusted your level today -Level 8 (1.3v)  Can increase by 1 level each week as comfortable Sleep titration study as planned next month  Follow up as planned in November and As needed       I spent 31  minutes dedicated to the care of this patient on the date of this encounter to include pre-visit review of records, face-to-face time with the patient discussing conditions above, post visit ordering of testing, clinical documentation with the electronic health record, making appropriate referrals as documented, and communicating necessary findings to members of the  patients care team.   Madelin Stank, NP 01/27/2024

## 2024-02-03 ENCOUNTER — Other Ambulatory Visit (HOSPITAL_BASED_OUTPATIENT_CLINIC_OR_DEPARTMENT_OTHER): Payer: Self-pay | Admitting: Student

## 2024-02-03 ENCOUNTER — Telehealth (HOSPITAL_BASED_OUTPATIENT_CLINIC_OR_DEPARTMENT_OTHER): Payer: Self-pay | Admitting: Student

## 2024-02-03 DIAGNOSIS — M170 Bilateral primary osteoarthritis of knee: Secondary | ICD-10-CM

## 2024-02-03 NOTE — Telephone Encounter (Signed)
 Gel injections authorization submitted

## 2024-02-03 NOTE — Telephone Encounter (Signed)
 Patient would like to go ahead with the gel injection and would like insurance paper work submitted

## 2024-02-14 ENCOUNTER — Encounter (HOSPITAL_BASED_OUTPATIENT_CLINIC_OR_DEPARTMENT_OTHER): Admitting: Pulmonary Disease

## 2024-02-16 ENCOUNTER — Ambulatory Visit (HOSPITAL_BASED_OUTPATIENT_CLINIC_OR_DEPARTMENT_OTHER): Attending: Pulmonary Disease | Admitting: Pulmonary Disease

## 2024-02-16 DIAGNOSIS — G4733 Obstructive sleep apnea (adult) (pediatric): Secondary | ICD-10-CM | POA: Insufficient documentation

## 2024-02-16 DIAGNOSIS — Z9682 Presence of neurostimulator: Secondary | ICD-10-CM | POA: Insufficient documentation

## 2024-02-24 ENCOUNTER — Ambulatory Visit (INDEPENDENT_AMBULATORY_CARE_PROVIDER_SITE_OTHER): Admitting: Student

## 2024-02-24 ENCOUNTER — Ambulatory Visit (HOSPITAL_BASED_OUTPATIENT_CLINIC_OR_DEPARTMENT_OTHER): Payer: Self-pay | Admitting: Pulmonary Disease

## 2024-02-24 ENCOUNTER — Ambulatory Visit: Payer: Self-pay | Admitting: Pulmonary Disease

## 2024-02-24 DIAGNOSIS — G4733 Obstructive sleep apnea (adult) (pediatric): Secondary | ICD-10-CM | POA: Diagnosis not present

## 2024-02-24 DIAGNOSIS — Z9682 Presence of neurostimulator: Secondary | ICD-10-CM

## 2024-02-24 DIAGNOSIS — M170 Bilateral primary osteoarthritis of knee: Secondary | ICD-10-CM

## 2024-02-24 MED ORDER — HYALURONAN 88 MG/4ML IX SOSY
88.0000 mg | PREFILLED_SYRINGE | INTRA_ARTICULAR | Status: AC | PRN
  Administered 2024-02-24: 88 mg via INTRA_ARTICULAR

## 2024-02-24 MED ORDER — LIDOCAINE HCL 1 % IJ SOLN
4.0000 mL | INTRAMUSCULAR | Status: AC | PRN
  Administered 2024-02-24: 4 mL

## 2024-02-24 NOTE — Progress Notes (Signed)
 Thank you for the update. Patient is scheduled for a follow-up with Tammy Parrett on 11/7 @9 :00. Nothing further is needed.

## 2024-02-24 NOTE — Progress Notes (Signed)
     HPI: Patient presents today for bilateral knee Monovisc injections.  He reports that cortisone injections performed on 9/11 only provided relief for a few days.  Pain is present in both knees.   Physical Exam: Bilateral knee exam demonstrates no significant effusions, erythema, or warmth.  Active range of motion from 0 to 120 degrees.  Stable collaterals with varus and valgus stress.   Procedure Note  Patient: [name] Le             Date of Birth: 02-May-1951           MRN: [id number]             Visit Date: 02/24/2024  Procedures: Visit Diagnoses:  1. Bilateral primary osteoarthritis of knee     Large Joint Inj: bilateral knee on 02/24/2024 8:29 AM Indications: pain Details: 22 G 1.5 in needle, anterolateral approach Medications (Right): 4 mL lidocaine  1 %; 88 mg Hyaluronan 88 MG/4ML Medications (Left): 4 mL lidocaine  1 %; 88 mg Hyaluronan 88 MG/4ML Outcome: tolerated well, no immediate complications Procedure, treatment alternatives, risks and benefits explained, specific risks discussed. Consent was given by the patient. Immediately prior to procedure a time out was called to verify the correct patient, procedure, equipment, support staff and site/side marked as required. Patient was prepped and draped in the usual sterile fashion.      Plan: Follow-up as needed    I personally saw and evaluated the patient, and participated in the management and treatment plan.  Leonce Reveal, PA-C Orthopedics

## 2024-02-24 NOTE — Telephone Encounter (Signed)
 FYI Only or Action Required?: Action required by provider: See Triage note- wanting to trial lower level to see if shocking sensation still causing slurred speech.  Patient is followed in Pulmonology for Sleep, last seen on 01/27/2024 by Parrett, [name] RAMAN, NP.  Called Nurse Triage reporting Neurologic Problem.  Symptoms began a week ago.  Interventions attempted: Nothing.  Symptoms are: stable.  Triage Disposition: No disposition on file.  Patient/caregiver understands and will follow disposition?:   [name] Le Pulmonary Triage - Initial Assessment Questions "Chief Complaint (e.g., cough, sob, wheezing, fever, chills, sweat or additional symptoms) *Go to specific symptom protocol after initial questions. Tongue shocked when activates- slurred speech for 1 minute  "How long have symptoms been present?" A week  Have you tested for COVID or Flu? Note: If not, ask patient if a home test can be taken. If so, instruct patient to call back for positive results. No   Copied from CRM #[id number]. Topic: Clinical - Medical Advice >> Feb 24, 2024 10:46 AM [name] Le wrote: Reason for CRM: pt states when he turned his inspire device on Sunday night and every night since it caused his tongue to feel like he was slurring his words. (Like  Lasted over a minute each night. Reason for Disposition  [1] Loss of speech or garbled speech AND [2] is a chronic symptom (recurrent or ongoing AND present > 4 weeks)  Answer Assessment - Initial Assessment Questions Patient with sleep study Friday and Saturday night with no noticeable issues. On Sunday he initiated his Inspire and within 30 min it felt like his tongue was dancing with the shocks. Monday night almost immediately his tongue felt heavy and harder to move, with slurred speech for about a minute. Wife finally saw it happen last night and confirmed slurring. Completely resolved within - Has been at level 7 for a few weeks- unsure if the sleep study  triggered anything.  Wants to make Dr [name] Le aware especially since he put results in the chart today. Appt made for Thursday 10/23 to discuss further.  Over the weekend patient was advised to get a video of the occurrence. He would like to try turning it down to 6 to see if it happens at that level but wants to make sure Dr [name] Le ok with that.  Denies any headache, facial asymetry, unilateral weakness, balance issues, of confusion. Advised ED for speech issues not related to inspire device  1. SYMPTOM: What is the main symptom you are concerned about? (e.g., weakness, numbness)     Slurred speech 2. ONSET: When did this start? (e.g., minutes, hours, days; while sleeping)     Immediately to 30 min after turning on his inspire device 3. LAST NORMAL: When was the last time you (the patient) were normal (no symptoms)?     NA 4. PATTERN Does this come and go, or has it been constant since it started?  Is it present now?     Only lasts about a minute 5. CARDIAC SYMPTOMS: Have you had any of the following symptoms: chest pain, difficulty breathing, palpitations?     Afib 6. NEUROLOGIC SYMPTOMS: Have you had any of the following symptoms: headache, dizziness, vision loss, double vision, changes in speech, unsteady on your feet?     denies 7. OTHER SYMPTOMS: Do you have any other symptoms?     Neck stiffness on that side when he first wakes up in AM and is gone as the day goes on  Protocols used: Neurologic Deficit-A-AH

## 2024-02-24 NOTE — Procedures (Signed)
 [facility] [address] [place], [place] [address] Tel: [phone]   Fax: [phone]  Inspire Interpretation  Patient Name:  [name] Le, [name] Le Date:  02/16/2024 Referring Physician:  HARDEN Earlisha Sharples [phone] %%startinterp%% Indications for Polysomnography The patient is a 73 year old Male who is 5' 9 and weighs 178.0 lbs. His BMI equals 26.1.  A full night inspire treatment study was performed.  Polysomnogram Data A full night polysomnogram recorded the standard physiologic parameters including EEG, EOG, EMG, EKG, nasal and oral airflow.  Respiratory parameters of chest and abdominal movements were recorded with Respiratory Inductance Plethysmography belts.  Oxygen saturation was recorded by pulse oximetry.   Sleep Architecture The total recording time of the polysomnogram was 464.7 minutes.  The total sleep time was 408.0 minutes.  The patient spent 7.7% of total sleep time in Stage N1, 71.1% in Stage N2, 0.2% in Stages N3, and 21.0% in REM.  Sleep latency was 2.8 minutes.  REM latency was 78.5 minutes.  Sleep Efficiency was 87.8%.  Wake after Sleep Onset time was 54.0 minutes.  Inspire Summary The patient was on inspire therapy at levels ranging from 1* volts up to 1.2* volts.  The last level used in the study was 1.2* volts.  Respiratory Events The polysomnogram revealed a presence of - obstructive, - central, and - mixed apneas resulting in an Apnea index of - events per hour.  There were 25 hypopneas (>=3% desaturation and/or arousal) resulting in an Apnea\Hypopnea Index (AHI >=3% desaturation and/or arousal) of 3.7 events per hour.  There were 10 hypopneas (>=4% desaturation) resulting in an Apnea\Hypopnea Index (AHI >=4% desaturation) of 1.5 events per hour.  There were 5 Respiratory Effort Related Arousals resulting in a RERA index of 0.7 events per hour. The Respiratory Disturbance Index is 4.4 events per hour.  The snore index was - events per  hour.  Mean oxygen saturation was 93.2%.  The lowest oxygen saturation during sleep was 89.0%.  Time spent <=88% oxygen saturation was - minutes (-).  Limb Activity There were - limb movements recorded.  Of this total, - were classified as PLMs.  Of the PLMs, - were associated with arousals.  The Limb Movement index was - per hour while the PLM index was - per hour.  Cardiac Summary The average pulse rate was 58.4 bpm.  The minimum pulse rate was 53.0 bpm while the maximum pulse rate was 101.0 bpm.  Cardiac rhythm was normal/abnormal.  Comments: Studied between 1.0-1.2 V. REM & Supine sleep was noted  Diagnosis: OSA s/p hypoglossal nerve stimulator  Recommendations: Therapeutic amplitude was obtained Device can be set at 1.0V & clinical correlation for efficiency   This study was personally reviewed and electronically signed by: Dr. HARDEN Staff Accredited Board Certified in Sleep Medicine

## 2024-02-24 NOTE — Telephone Encounter (Signed)
**Note De-identified  [place] Obfuscation** Please advise 

## 2024-02-24 NOTE — Telephone Encounter (Signed)
 Called [name] Le back no answer left detailed voicemail to update that we will keep the appointment with Dr. Jude on Thursday, 10/23, to adjust levels. With scheduled appointment goes well, we will cancel the follow-up with Tammy on 11/7. Nothing further needed at this time.

## 2024-03-01 ENCOUNTER — Ambulatory Visit (INDEPENDENT_AMBULATORY_CARE_PROVIDER_SITE_OTHER): Admitting: Pulmonary Disease

## 2024-03-01 ENCOUNTER — Encounter (HOSPITAL_BASED_OUTPATIENT_CLINIC_OR_DEPARTMENT_OTHER): Payer: Self-pay | Admitting: Pulmonary Disease

## 2024-03-01 VITALS — BP 140/90 | HR 64 | Ht 69.0 in | Wt 180.0 lb

## 2024-03-01 DIAGNOSIS — Z9682 Presence of neurostimulator: Secondary | ICD-10-CM

## 2024-03-01 DIAGNOSIS — G4733 Obstructive sleep apnea (adult) (pediatric): Secondary | ICD-10-CM

## 2024-03-01 DIAGNOSIS — Z4542 Encounter for adjustment and management of neuropacemaker (brain) (peripheral nerve) (spinal cord): Secondary | ICD-10-CM | POA: Diagnosis not present

## 2024-03-01 NOTE — Patient Instructions (Signed)
 We changed you to 1.0 V = level 3 on your remote That is your therapeutic level

## 2024-03-01 NOTE — Progress Notes (Signed)
 Subjective:    Patient ID: [name] Le, [name] Le    DOB: 05/11/1950, 73 y.o.   MRN: [id number]  73 year old [name] Le with OSA s/p inspire device     PMH : Atrial fib s/p ablation   Activation 11/28/23 Sensation 0.4 V , functional level 0.6 V lower limit, upper limit 1.6 V Start delay was , pause time 15 minutes, duration 8 hours  Discussed the use of AI scribe software for clinical note transcription with the patient, who gave verbal consent to proceed.  History of Present Illness [name] Le is a 73 year old [name] Le who presents with issues related to his sleep device settings.  He experiences unusual sensations with his sleep device, starting on Friday night, with a strong sensation upon activation that subsides after about a minute. The following night, similar sensations occur, leading to slurred speech.  He contacts the device manufacturer on Monday, and the Lithium tech reviews the device settings. The settings were initially at 7.1 volts and changed to 7.6 volts. He is currently using a setting of 1.3 volts, corresponding to level four on his device.  He reports sleeping well, particularly noting a good night's sleep the previous night, while using the device at level four, set at 1.3 volts.     Significant tests/ events reviewed   02/2024 inspire titration >> Therapeutic amplitude  at 1.0V 09/2022 split night >>severe obstructive sleep apnea with an AHI 50.8/h and mild central sleep apnea with a central AHI of 11.8/h >> CPAP 14 cm  Review of Systems  neg for any significant sore throat, dysphagia, itching, sneezing, nasal congestion or excess/ purulent secretions, fever, chills, sweats, unintended wt loss, pleuritic or exertional cp, hempoptysis, orthopnea pnd or change in chronic leg swelling. Also denies presyncope, palpitations, heartburn, abdominal pain, nausea, vomiting, diarrhea or change in bowel or urinary habits, dysuria,hematuria, rash, arthralgias, visual  complaints, headache, numbness weakness or ataxia.      Objective:   Physical Exam  Gen. Pleasant, well-nourished, in no distress ENT - no thrush, no pallor/icterus,no post nasal drip Neck: No JVD, no thyromegaly, no carotid bruits Lungs: no use of accessory muscles, no dullness to percussion, clear without rales or rhonchi  Cardiovascular: Rhythm regular, heart sounds  normal, no murmurs or gallops, no peripheral edema Musculoskeletal: No deformities, no cyanosis or clubbing   Device was interrogated with programmer - found to be at 1.3 V/ level 4 Sleep study reviewed     Assessment & Plan:   Assessment and Plan Assessment & Plan Obstructive sleep apnea, status post hypoglossal nerve stimulator He experienced increased activity and slurring of speech due to the hypoglossal nerve stimulator being set at a higher voltage than intended. The device was initially set at 7.6 volts instead of the intended 1.0 volts, leading to discomfort and speech issues. He has been using the device at a lower setting of 1.3 volts (level 4) but will be adjusted to 1.0 volts (level 3) for optimal therapeutic effect. The adjustment aims to reduce the number of apnea events while minimizing discomfort. The decision to adjust to 1.0 volts is based on the sleep study results showing reduced apnea events at this level. - Adjust the hypoglossal nerve stimulator to 1.0 volts, corresponding to level 3 on the remote. - Provide a range of two levels above and two levels below for adjustment if needed. - Instruct to sync the device with the phone once or twice a week for accurate reporting. - Cancel  the follow-up appointment scheduled for two weeks from today. - Schedule a follow-up appointment in three months to reassess the situation.

## 2024-03-12 ENCOUNTER — Encounter: Payer: Self-pay | Admitting: Radiology

## 2024-03-16 ENCOUNTER — Ambulatory Visit: Admitting: Adult Health

## 2024-03-30 LAB — HM COLONOSCOPY

## 2024-05-29 ENCOUNTER — Ambulatory Visit: Payer: Medicare Other

## 2024-05-29 ENCOUNTER — Emergency Department (HOSPITAL_BASED_OUTPATIENT_CLINIC_OR_DEPARTMENT_OTHER)

## 2024-05-29 ENCOUNTER — Emergency Department (HOSPITAL_BASED_OUTPATIENT_CLINIC_OR_DEPARTMENT_OTHER)
Admission: EM | Admit: 2024-05-29 | Discharge: 2024-05-29 | Disposition: A | Discharge status: Home / Self Care | Attending: Emergency Medicine | Admitting: Emergency Medicine

## 2024-05-29 ENCOUNTER — Encounter (HOSPITAL_BASED_OUTPATIENT_CLINIC_OR_DEPARTMENT_OTHER): Payer: Self-pay

## 2024-05-29 ENCOUNTER — Other Ambulatory Visit: Payer: Self-pay

## 2024-05-29 DIAGNOSIS — I4891 Unspecified atrial fibrillation: Secondary | ICD-10-CM | POA: Insufficient documentation

## 2024-05-29 DIAGNOSIS — Z7901 Long term (current) use of anticoagulants: Secondary | ICD-10-CM | POA: Insufficient documentation

## 2024-05-29 DIAGNOSIS — I700 Atherosclerosis of aorta: Secondary | ICD-10-CM | POA: Diagnosis not present

## 2024-05-29 DIAGNOSIS — J189 Pneumonia, unspecified organism: Secondary | ICD-10-CM | POA: Diagnosis not present

## 2024-05-29 DIAGNOSIS — R918 Other nonspecific abnormal finding of lung field: Secondary | ICD-10-CM | POA: Insufficient documentation

## 2024-05-29 DIAGNOSIS — R059 Cough, unspecified: Secondary | ICD-10-CM | POA: Diagnosis present

## 2024-05-29 DIAGNOSIS — D72829 Elevated white blood cell count, unspecified: Secondary | ICD-10-CM | POA: Diagnosis not present

## 2024-05-29 DIAGNOSIS — I2510 Atherosclerotic heart disease of native coronary artery without angina pectoris: Secondary | ICD-10-CM | POA: Insufficient documentation

## 2024-05-29 DIAGNOSIS — K5730 Diverticulosis of large intestine without perforation or abscess without bleeding: Secondary | ICD-10-CM | POA: Diagnosis not present

## 2024-05-29 LAB — BASIC METABOLIC PANEL WITH GFR
Anion gap: 11 (ref 5–15)
BUN: 15 mg/dL (ref 8–23)
CO2: 23 mmol/L (ref 22–32)
Calcium: 9 mg/dL (ref 8.9–10.3)
Chloride: 106 mmol/L (ref 98–111)
Creatinine, Ser: 1.1 mg/dL (ref 0.61–1.24)
GFR, Estimated: >60 mL/min
Glucose, Bld: 94 mg/dL (ref 70–99)
Potassium: 4.1 mmol/L (ref 3.5–5.1)
Sodium: 140 mmol/L (ref 135–145)

## 2024-05-29 LAB — CBC WITH DIFFERENTIAL/PLATELET
Abs Immature Granulocytes: 0.04 K/uL (ref 0.00–0.07)
Basophils Absolute: 0.1 K/uL (ref 0.0–0.1)
Basophils Relative: 1 %
Eosinophils Absolute: 0.2 K/uL (ref 0.0–0.5)
Eosinophils Relative: 2 %
HCT: 49.6 % (ref 39.0–52.0)
Hemoglobin: 16.1 g/dL (ref 13.0–17.0)
Immature Granulocytes: 0 %
Lymphocytes Relative: 12 %
Lymphs Abs: 1.4 K/uL (ref 0.7–4.0)
MCH: 26.7 pg (ref 26.0–34.0)
MCHC: 32.5 g/dL (ref 30.0–36.0)
MCV: 82.4 fL (ref 80.0–100.0)
Monocytes Absolute: 1.2 K/uL — ABNORMAL HIGH (ref 0.1–1.0)
Monocytes Relative: 10 %
Neutro Abs: 8.8 K/uL — ABNORMAL HIGH (ref 1.7–7.7)
Neutrophils Relative %: 75 %
Platelets: 292 K/uL (ref 150–400)
RBC: 6.02 MIL/uL — ABNORMAL HIGH (ref 4.22–5.81)
RDW: 18.4 % — ABNORMAL HIGH (ref 11.5–15.5)
WBC: 11.7 K/uL — ABNORMAL HIGH (ref 4.0–10.5)
nRBC: 0 % (ref 0.0–0.2)

## 2024-05-29 LAB — RESP PANEL BY RT-PCR (RSV, FLU A&B, COVID)  RVPGX2
Influenza A by PCR: NEGATIVE
Influenza B by PCR: NEGATIVE
Resp Syncytial Virus by PCR: NEGATIVE
SARS Coronavirus 2 by RT PCR: NEGATIVE

## 2024-05-29 LAB — PRO BRAIN NATRIURETIC PEPTIDE: Pro Brain Natriuretic Peptide: 1215 pg/mL — ABNORMAL HIGH

## 2024-05-29 MED ORDER — DILTIAZEM HCL 30 MG PO TABS
30.0000 mg | ORAL_TABLET | ORAL | Status: AC
  Administered 2024-05-29: 30 mg via ORAL
  Filled 2024-05-29: qty 1

## 2024-05-29 MED ORDER — DOXYCYCLINE HYCLATE 100 MG PO CAPS: 100.0000 mg | ORAL_CAPSULE | Freq: Two times a day (BID) | ORAL | 0 refills | Status: AC

## 2024-05-29 MED ORDER — DILTIAZEM HCL 30 MG PO TABS: 30.0000 mg | ORAL_TABLET | Freq: Three times a day (TID) | ORAL | 0 refills | Status: AC

## 2024-05-29 NOTE — ED Triage Notes (Signed)
 Pt reports that he has had a cough x 4 days. Pt denies CP/SOB.

## 2024-05-29 NOTE — ED Provider Notes (Signed)
 " [place] EMERGENCY DEPARTMENT AT [facility] Provider Note   CSN: [id number] Arrival date & time: 05/29/24  [id number]     Patient presents with: Cough   [name] Le is a 74 y.o. male.   74 year old male who presents with 4 days of cough congestion.  Has had runny nose.  Does have history of A-fib and states that he checked his watch and found that he was in A-fib.  Has no idea when it started.  He has compliance with Eliquis .  Was on amiodarone  in the past.  Denies any chest pain.  No severe dyspnea exertion.  No fever or chills.  No vomiting or diarrhea.  States he had trouble sleeping last night due to the worsening cough.  Denies any pulmonary history       Prior to Admission medications  Medication Sig Start Date End Date Taking? Authorizing Provider  acetaminophen  (TYLENOL ) 500 MG tablet Take 500 mg by mouth every 6 (six) hours as needed for moderate pain (pain score 4-6).    [provider]  anastrozole (ARIMIDEX) 1 MG tablet Take 0.5 mg by mouth every 14 (fourteen) days.    [provider]  apixaban  (ELIQUIS ) 5 MG TABS tablet Take 1 tablet (5 mg total) by mouth 2 (two) times daily. 10/25/22 03/01/24  Mealor, [name], MD  famotidine  (PEPCID ) 20 MG tablet Take 40 mg by mouth every evening.    [provider]  fexofenadine (ALLEGRA) 180 MG tablet Take 180 mg by mouth daily.    [provider]  sildenafil (VIAGRA) 100 MG tablet Take 100 mg by mouth as needed for erectile dysfunction. 04/19/22   [provider]    Allergies: Patient has no active allergies.    Review of Systems  All other systems reviewed and are negative.   Updated Vital Signs BP (!) 144/97   Pulse 94   Temp 97.7 F (36.5 C) (Oral)   Resp 19   SpO2 95%   Physical Exam Vitals and nursing note reviewed.  Constitutional:      General: He is not in acute distress.    Appearance: Normal appearance. He is well-developed. He is not toxic-appearing.   HENT:     Head: Normocephalic and atraumatic.  Eyes:     General: Lids are normal.     Conjunctiva/sclera: Conjunctivae normal.     Pupils: Pupils are equal, round, and reactive to light.  Neck:     Thyroid: No thyroid mass.     Trachea: No tracheal deviation.  Cardiovascular:     Rate and Rhythm: Tachycardia present. Rhythm irregular.     Heart sounds: Normal heart sounds. No murmur heard.    No gallop.  Pulmonary:     Effort: Pulmonary effort is normal. No respiratory distress.     Breath sounds: No stridor. Examination of the right-lower field reveals decreased breath sounds. Examination of the left-lower field reveals decreased breath sounds. Decreased breath sounds present. No wheezing, rhonchi or rales.  Abdominal:     General: There is no distension.     Palpations: Abdomen is soft.     Tenderness: There is no abdominal tenderness. There is no rebound.  Musculoskeletal:        General: No tenderness. Normal range of motion.     Cervical back: Normal range of motion and neck supple.  Skin:    General: Skin is warm and dry.     Findings: No abrasion or rash.  Neurological:  Mental Status: He is alert and oriented to person, place, and time. Mental status is at baseline.     GCS: GCS eye subscore is 4. GCS verbal subscore is 5. GCS motor subscore is 6.     Cranial Nerves: No cranial nerve deficit.     Sensory: No sensory deficit.     Motor: Motor function is intact.  Psychiatric:        Attention and Perception: Attention normal.        Speech: Speech normal.        Behavior: Behavior normal.     (all labs ordered are listed, but only abnormal results are displayed) Labs Reviewed  RESP PANEL BY RT-PCR (RSV, FLU A&B, COVID)  [id number]  CBC WITH DIFFERENTIAL/PLATELET  BASIC METABOLIC PANEL WITH GFR  PRO BRAIN NATRIURETIC PEPTIDE    EKG: EKG Interpretation Date/Time:  Tuesday May 29 2024 07:20:27 EST Ventricular Rate:  109 PR Interval:    QRS  Duration:  76 QT Interval:  329 QTC Calculation: 437 R Axis:   20  Text Interpretation: Atrial fibrillation Anterior infarct, old No significant change since last tracing Confirmed by Dasie Faden ([id number]) on 05/29/2024 7:46:02 AM  Radiology: No results found.   Procedures   Medications Ordered in the ED - No data to display                                  Medical Decision Making Amount and/or Complexity of Data Reviewed Labs: ordered. Radiology: ordered.  Risk Prescription drug management.   Patient is EKG shows A-fib with a rate of 109.  CBC shows mild leukocytosis 11.7 thousand.  Patient is COVID and flu and RSV negative.  BNP mildly elevated at 1215.  Patient however has no CHF type symptoms.  Chest x-ray showed possible cavitary lesion and follow-up CT chest showed probable infectious process but cannot rule out neoplasm.  Review of patient's old records from [facility] was done.  Shows that patient had an ablation done about a year ago.  Was on Cardizem  at that time.  Patient's rate here has been slightly elevated at times but otherwise patient remains asymptomatic.  He states compliance with his Eliquis .  Gave patient Cardizem  orally here.  Plan will be to place patient on Cardizem , continue his Eliquis , placed on antibiotics.  Suspect that patient's underlying pulm infection could be causing his A-fib to be exacerbated.  Patient to call his cardiologist today at [facility] for follow-up.  He has no evidence of volume overload at this time.  Will not give Lasix.  Do not feel that he needs to be admitted at this time as he can get close follow-up.  Return precautions given  CRITICAL CARE Performed by: Faden ONEIDA Dasie Total critical care time: 55 minutes Critical care time was exclusive of separately billable procedures and treating other patients. Critical care was necessary to treat or prevent imminent or life-threatening deterioration. Critical care was time spent personally by me on  the following activities: development of treatment plan with patient and/or surrogate as well as nursing, discussions with consultants, evaluation of patient's response to treatment, examination of patient, obtaining history from patient or surrogate, ordering and performing treatments and interventions, ordering and review of laboratory studies, ordering and review of radiographic studies, pulse oximetry and re-evaluation of patient's condition.      Final diagnoses:  None    ED Discharge Orders  None          Dasie Faden, MD 05/29/24 1010  "

## 2024-05-29 NOTE — Discharge Instructions (Signed)
 Call your cardiologist today to schedule a follow-up visit.  Call 911 if you develop shortness of breath, chest pain, or any other problems

## 2024-06-01 ENCOUNTER — Ambulatory Visit: Payer: Medicare Other

## 2024-06-01 VITALS — Ht 69.0 in | Wt 180.0 lb

## 2024-06-01 DIAGNOSIS — Z0000 Encounter for general adult medical examination without abnormal findings: Secondary | ICD-10-CM | POA: Diagnosis not present

## 2024-06-01 NOTE — Patient Instructions (Signed)
 [name] Le,  Thank you for taking the time for your Medicare Wellness Visit. I appreciate your continued commitment to your health goals. Please review the care plan we discussed, and feel free to reach out if I can assist you further.  Please note that Annual Wellness Visits do not include a physical exam. Some assessments may be limited, especially if the visit was conducted virtually. If needed, we may recommend an in-person follow-up with your provider.  Ongoing Care Seeing your primary care provider every 3 to 6 months helps us  monitor your health and provide consistent, personalized care.   Referrals If a referral was made during today's visit and you haven't received any updates within two weeks, please contact the referred provider directly to check on the status.  Recommended Screenings:  Health Maintenance  Topic Date Due   Osteoporosis screening with Bone Density Scan  Never done   COVID-19 Vaccine (1) Never done   Zoster (Shingles) Vaccine (1 of 2) Never done   Pneumococcal Vaccine for age over 7 (1 of 1 - PCV) Never done   Flu Shot  Never done   Medicare Annual Wellness Visit  05/26/2024   DTaP/Tdap/Td vaccine (3 - Td or Tdap) 12/07/2033   Colon Cancer Screening  03/30/2034   Hepatitis C Screening  Completed   Meningitis B Vaccine  Aged Out       02/16/2024    8:11 PM  Advanced Directives  Does Patient Have a Medical Advance Directive? No  Would patient like information on creating a medical advance directive? No - Patient declined    Vision: Annual vision screenings are recommended for early detection of glaucoma, cataracts, and diabetic retinopathy. These exams can also reveal signs of chronic conditions such as diabetes and high blood pressure.  Dental: Annual dental screenings help detect early signs of oral cancer, gum disease, and other conditions linked to overall health, including heart disease and diabetes.  Please see the attached documents for  additional preventive care recommendations.

## 2024-06-01 NOTE — Progress Notes (Signed)
 "  Chief Complaint  Patient presents with   Medicare Wellness     Subjective:   [name] Le is a 74 y.o. male who presents for a Medicare Annual Wellness Visit.  Visit info / Clinical Intake: Medicare Wellness Visit Type:: Subsequent Annual Wellness Visit Persons participating in visit and providing information:: patient Medicare Wellness Visit Mode:: Telephone If telephone:: video declined If Telephone or Video please confirm:: I connected with patient using audio/video enable telemedicine. I verified patient identity with two identifiers, discussed telehealth limitations, and patient agreed to proceed. Patient Location:: home Provider Location:: home office Interpreter Needed?: No Pre-visit prep was completed: yes AWV questionnaire completed by patient prior to visit?: no Living arrangements:: lives with spouse/significant other Patient's Overall Health Status Rating: good Typical amount of pain: some Does pain affect daily life?: no Are you currently prescribed opioids?: no  Dietary Habits and Nutritional Risks How many meals a day?: 3 Eats fruit and vegetables daily?: yes Most meals are obtained by: preparing own meals In the last 2 weeks, have you had any of the following?: none Diabetic:: no  Functional Status Activities of Daily Living (to include ambulation/medication): Independent Ambulation: Independent Medication Administration: Independent Home Management (perform basic housework or laundry): Independent Manage your own finances?: yes Primary transportation is: driving Concerns about vision?: no *vision screening is required for WTM* Concerns about hearing?: no  Fall Screening Falls in the past year?: 0 Number of falls in past year: 0 Was there an injury with Fall?: 0 Fall Risk Category Calculator: 0 Patient Fall Risk Level: Low Fall Risk  Fall Risk Patient at Risk for Falls Due to: No Fall Risks Fall risk Follow up: Falls evaluation completed;  Education provided; Falls prevention discussed  Home and Transportation Safety: All rugs have non-skid backing?: yes All stairs or steps have railings?: yes Grab bars in the bathtub or shower?: (!) no (seat in shower) Have non-skid surface in bathtub or shower?: yes Good home lighting?: yes Regular seat belt use?: yes Hospital stays in the last year:: no  Cognitive Assessment Difficulty concentrating, remembering, or making decisions? : no Will 6CIT or Mini Cog be Completed: yes What year is it?: 0 points What month is it?: 0 points Give patient an address phrase to remember (5 components): [address] [place]  About what time is it?: 0 points Count backwards from 20 to 1: 0 points Say the months of the year in reverse: 0 points Repeat the address phrase from earlier: 0 points 6 CIT Score: 0 points  Advance Directives (For Healthcare) Does Patient Have a Medical Advance Directive?: No Would patient like information on creating a medical advance directive?: Yes (MAU/Ambulatory/Procedural Areas - Information given)  Reviewed/Updated  Reviewed/Updated: Reviewed All (Medical, Surgical, Family, Medications, Allergies, Care Teams, Patient Goals)    Allergies (verified) Patient has no active allergies.   Current Medications (verified) Outpatient Encounter Medications as of 06/01/2024  Medication Sig   anastrozole (ARIMIDEX) 1 MG tablet Take 0.5 mg by mouth every 14 (fourteen) days.   apixaban  (ELIQUIS ) 5 MG TABS tablet Take 5 mg by mouth 2 (two) times daily.   doxycycline  (VIBRAMYCIN ) 100 MG capsule Take 1 capsule (100 mg total) by mouth 2 (two) times daily.   famotidine  (PEPCID ) 20 MG tablet Take 20 mg by mouth every evening. Pt says takes 2 As needed at hs   sildenafil (VIAGRA) 100 MG tablet Take 100 mg by mouth as needed for erectile dysfunction.   diltiazem  (CARDIZEM ) 30 MG tablet  Take 1 tablet (30 mg total) by mouth 3 (three) times daily. (Patient not taking:  Reported on 06/01/2024)   No facility-administered encounter medications on file as of 06/01/2024.    History: Past Medical History:  Diagnosis Date   Arthritis    Atrial fibrillation (HCC)    Closed fracture of tuft of distal phalanx of finger 90's   left   Closed fracture of tuft of distal phalanx of right thumb with malunion 09/07/2012   ORIF with pin [place])   ED (erectile dysfunction)    GERD (gastroesophageal reflux disease)    Lumbar degenerative disc disease    no problems that limit activity   MRSA (methicillin resistant staph aureus) culture positive 06/12/2012   left axilla   Sleep apnea    Past Surgical History:  Procedure Laterality Date   ABLATION  01/2023   CARDIOVERSION N/A 07/29/2022   Procedure: CARDIOVERSION;  Surgeon: Loni Soyla LABOR, MD;  Location: [facility] ENDOSCOPY;  Service: Cardiovascular;  Laterality: N/A;   CARDIOVERSION N/A 09/23/2022   Procedure: CARDIOVERSION;  Surgeon: Delford Maude BROCKS, MD;  Location: [facility] INVASIVE CV LAB;  Service: Cardiovascular;  Laterality: N/A;   DRUG INDUCED ENDOSCOPY Bilateral 09/01/2023   Procedure: DRUG INDUCED SLEEP ENDOSCOPY;  Surgeon: Carlie Clark, MD;  Location: [facility] ENDOSCOPY;  Service: ENT;  Laterality: Bilateral;   IMPLANTATION OF HYPOGLOSSAL NERVE STIMULATOR Bilateral 10/13/2023   Procedure: INSERTION, HYPOGLOSSAL NERVE STIMULATOR;  Surgeon: Carlie Clark, MD;  Location: [facility] OR;  Service: ENT;  Laterality: Bilateral;   LACERATION REPAIR Left    chain laceration left forearm - 13 stitches.   TONSILLECTOMY     tuft fracture Left 09/07/2012   ORIF with PIN ( [place])   Family History  Problem Relation Age of Onset   Lung cancer Mother    Cancer Mother        lung with mets   Social History   Occupational History   Occupation: [employer]  Tobacco Use   Smoking status: Never   Smokeless tobacco: Never   Tobacco comments:    Never smoke 08/06/22  Vaping Use   Vaping status: Never Used  Substance and  Sexual Activity   Alcohol use: Not Currently   Drug use: No   Sexual activity: Yes    Partners: Female   Tobacco Counseling Counseling given: Not Answered Tobacco comments: Never smoke 08/06/22  SDOH Screenings   Food Insecurity: No Food Insecurity (06/01/2024)  Housing: Unknown (06/01/2024)  Transportation Needs: No Transportation Needs (06/01/2024)  Utilities: Not At Risk (06/01/2024)  Alcohol Screen: Low Risk (05/27/2023)  Depression (PHQ2-9): Low Risk (06/01/2024)  Financial Resource Strain: Low Risk (05/27/2023)  Physical Activity: Inactive (06/01/2024)  Social Connections: Moderately Isolated (06/01/2024)  Stress: No Stress Concern Present (06/01/2024)  Tobacco Use: Low Risk (06/01/2024)  Health Literacy: Adequate Health Literacy (06/01/2024)   See flowsheets for full screening details  Depression Screen PHQ 2 & 9 Depression Scale- Over the past 2 weeks, how often have you been bothered by any of the following problems? Little interest or pleasure in doing things: 0 Feeling down, depressed, or hopeless (PHQ Adolescent also includes...irritable): 0 PHQ-2 Total Score: 0     Goals Addressed             This Visit's Progress    Patient Stated   Not on track    I would like to manage heart problems and get into more regular walking routine  Objective:    Today's Vitals   06/01/24 0817  Weight: 180 lb (81.6 kg)  Height: 5' 9 (1.753 m)   Body mass index is 26.58 kg/m.  Hearing/Vision screen No results found. Immunizations and Health Maintenance Health Maintenance  Topic Date Due   Bone Density Scan  Never done   COVID-19 Vaccine (1) Never done   Zoster Vaccines- Shingrix (1 of 2) Never done   Pneumococcal Vaccine: 50+ Years (1 of 1 - PCV) Never done   Influenza Vaccine  Never done   Medicare Annual Wellness (AWV)  05/26/2024   DTaP/Tdap/Td (3 - Td or Tdap) 12/07/2033   Colonoscopy  03/30/2034   Hepatitis C Screening  Completed   Meningococcal B  Vaccine  Aged Out        Assessment/Plan:  This is a routine wellness examination for Abbie.  Patient Care Team: Wendee Lynwood HERO, NP as PCP - General (Nurse Practitioner) Shlomo Wilbert SAUNDERS, MD as PCP - Sleep Medicine (Cardiology) Ezzard Lamar Kent, MD as Referring Physician (Cardiology)  I have personally reviewed and noted the following in the patients chart:   Medical and social history Use of alcohol, tobacco or illicit drugs  Current medications and supplements including opioid prescriptions. Functional ability and status Nutritional status Physical activity Advanced directives List of other physicians Hospitalizations, surgeries, and ER visits in previous 12 months Vitals Screenings to include cognitive, depression, and falls Referrals and appointments  No orders of the defined types were placed in this encounter.  In addition, I have reviewed and discussed with patient certain preventive protocols, quality metrics, and best practice recommendations. A written personalized care plan for preventive services as well as general preventive health recommendations were provided to patient.   Erminio LITTIE Saris, LPN   [id number]    After Visit Summary: (MyChart) Due to this being a telephonic visit, the after visit summary with patients personalized plan was offered to patient via MyChart   Nurse Notes: No voiced or noted concerns at this time Patient advised to keep follow-up appointment with PCP (07/20/24) Appointment(s) made: (March 2026 w/PCP) Vaccines not given: Influenza, PCV,Shingrix, Covid declined today Screenings not ordered: Will discuss Bone Density with PCP at next visit  "

## 2024-06-13 NOTE — Progress Notes (Signed)
 No change

## 2024-07-20 ENCOUNTER — Encounter: Admitting: Nurse Practitioner
